# Patient Record
Sex: Male | Born: 1960 | Race: White | Hispanic: No | Marital: Married | Smoking: Never smoker
Health system: Southern US, Community
[De-identification: ages and names within clinical notes are randomized; demographics above are authoritative.]

## PROBLEM LIST (undated history)

## (undated) DIAGNOSIS — D649 Anemia, unspecified: Secondary | ICD-10-CM

## (undated) DIAGNOSIS — Z789 Other specified health status: Secondary | ICD-10-CM

## (undated) HISTORY — DX: Anemia, unspecified: D64.9

## (undated) HISTORY — PX: CATARACT EXTRACTION: SUR2

## (undated) HISTORY — PX: CHOLECYSTECTOMY: SHX55

## (undated) HISTORY — PX: APPENDECTOMY: SHX54

## (undated) HISTORY — PX: UPPER GASTROINTESTINAL ENDOSCOPY: SHX188

## (undated) HISTORY — PX: VASECTOMY: SHX75

## (undated) HISTORY — PX: COLONOSCOPY: SHX174

---
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
## 1997-12-11 ENCOUNTER — Emergency Department (HOSPITAL_COMMUNITY): Admission: EM | Admit: 1997-12-11 | Discharge: 1997-12-11 | Payer: Self-pay | Admitting: Emergency Medicine

## 1997-12-18 ENCOUNTER — Emergency Department (HOSPITAL_COMMUNITY): Admission: EM | Admit: 1997-12-18 | Discharge: 1997-12-18 | Payer: Self-pay | Admitting: Emergency Medicine

## 2000-11-22 ENCOUNTER — Other Ambulatory Visit: Admission: RE | Admit: 2000-11-22 | Discharge: 2000-11-22 | Payer: Self-pay | Admitting: Urology

## 2012-08-20 ENCOUNTER — Encounter (HOSPITAL_COMMUNITY): Payer: Self-pay | Admitting: Emergency Medicine

## 2012-08-20 ENCOUNTER — Emergency Department (HOSPITAL_COMMUNITY)
Admission: EM | Admit: 2012-08-20 | Discharge: 2012-08-20 | Disposition: A | Discharge status: Home / Self Care | Payer: Managed Care, Other (non HMO) | Attending: Emergency Medicine | Admitting: Emergency Medicine

## 2012-08-20 ENCOUNTER — Emergency Department (HOSPITAL_COMMUNITY): Payer: Managed Care, Other (non HMO)

## 2012-08-20 DIAGNOSIS — N200 Calculus of kidney: Secondary | ICD-10-CM | POA: Insufficient documentation

## 2012-08-20 DIAGNOSIS — N23 Unspecified renal colic: Secondary | ICD-10-CM | POA: Insufficient documentation

## 2012-08-20 DIAGNOSIS — Z9089 Acquired absence of other organs: Secondary | ICD-10-CM | POA: Insufficient documentation

## 2012-08-20 DIAGNOSIS — R112 Nausea with vomiting, unspecified: Secondary | ICD-10-CM | POA: Insufficient documentation

## 2012-08-20 DIAGNOSIS — R319 Hematuria, unspecified: Secondary | ICD-10-CM | POA: Insufficient documentation

## 2012-08-20 LAB — URINALYSIS, ROUTINE W REFLEX MICROSCOPIC
Bilirubin Urine: NEGATIVE
Glucose, UA: NEGATIVE mg/dL
Specific Gravity, Urine: >1.03 — ABNORMAL HIGH (ref 1.005–1.030)
pH: 6 (ref 5.0–8.0)

## 2012-08-20 LAB — URINE MICROSCOPIC-ADD ON

## 2012-08-20 MED ORDER — KETOROLAC TROMETHAMINE 30 MG/ML IJ SOLN
30.0000 mg | Freq: Once | INTRAMUSCULAR | Status: AC
  Administered 2012-08-20: 30 mg via INTRAVENOUS
  Filled 2012-08-20: qty 1

## 2012-08-20 MED ORDER — SODIUM CHLORIDE 0.9 % IV SOLN
Freq: Once | INTRAVENOUS | Status: AC
  Administered 2012-08-20: 09:00:00 via INTRAVENOUS

## 2012-08-20 MED ORDER — OXYCODONE-ACETAMINOPHEN 5-325 MG PO TABS: ORAL_TABLET | ORAL | Status: DC

## 2012-08-20 MED ORDER — ONDANSETRON HCL 4 MG/2ML IJ SOLN
4.0000 mg | Freq: Once | INTRAMUSCULAR | Status: AC
  Administered 2012-08-20: 4 mg via INTRAVENOUS
  Filled 2012-08-20: qty 2

## 2012-08-20 MED ORDER — MELOXICAM 15 MG PO TABS: 15.0000 mg | ORAL_TABLET | Freq: Every day | ORAL | Status: DC

## 2012-08-20 MED ORDER — PROCHLORPERAZINE EDISYLATE 5 MG/ML IJ SOLN: 10.0000 mg | Freq: Once | INTRAMUSCULAR | Status: DC

## 2012-08-20 MED ORDER — HYDROMORPHONE HCL PF 1 MG/ML IJ SOLN
1.0000 mg | Freq: Once | INTRAMUSCULAR | Status: AC
  Administered 2012-08-20: 1 mg via INTRAVENOUS
  Filled 2012-08-20: qty 1

## 2012-08-20 NOTE — ED Notes (Signed)
States he started having right sided flank pain with hematuria yesterday, now having episodes of nausea and vomiting.

## 2012-08-20 NOTE — ED Provider Notes (Signed)
History     CSN: [id number]  Arrival date & time 08/20/12  9735   First MD Initiated Contact with Patient 08/20/12 [phone]      Chief Complaint  Patient presents with  . Hematuria  . Flank Pain    (Consider location/radiation/quality/duration/timing/severity/associated sxs/prior treatment) Patient is a 52 y.o. male presenting with flank pain. The history is provided by the patient.  Flank Pain This is a new problem. The current episode started yesterday. The problem occurs intermittently. The problem has been gradually worsening. Associated symptoms include abdominal pain, nausea and vomiting. Pertinent negatives include no arthralgias, chest pain, coughing, fever or neck pain. Nothing aggravates the symptoms. He has tried nothing for the symptoms. The treatment provided no relief.    History reviewed. No pertinent past medical history.  Past Surgical History  Procedure Laterality Date  . Appendectomy      No family history on file.  History  Substance Use Topics  . Smoking status: Not on file  . Smokeless tobacco: Not on file  . Alcohol Use: Not on file      Review of Systems  Constitutional: Negative for fever and activity change.       All ROS Neg except as noted in HPI  HENT: Negative for nosebleeds and neck pain.   Eyes: Negative for photophobia and discharge.  Respiratory: Negative for cough, shortness of breath and wheezing.   Cardiovascular: Negative for chest pain and palpitations.  Gastrointestinal: Positive for nausea, vomiting and abdominal pain. Negative for blood in stool.  Genitourinary: Positive for hematuria and flank pain. Negative for dysuria and frequency.  Musculoskeletal: Negative for back pain and arthralgias.  Skin: Negative.   Neurological: Negative for dizziness, seizures and speech difficulty.  Psychiatric/Behavioral: Negative for hallucinations and confusion.    Allergies  Review of patient's allergies indicates no known  allergies.  Home Medications  No current outpatient prescriptions on file.  BP 117/77  Pulse 76  Temp(Src) 97 F (36.1 C) (Oral)  Resp 20  Ht 5' 11"  (1.803 m)  Wt 230 lb (104.327 kg)  BMI 32.09 kg/m2  SpO2 100%  Physical Exam  Nursing note and vitals reviewed. Constitutional: He is oriented to person, place, and time. He appears well-developed and well-nourished.  Non-toxic appearance.  HENT:  Head: Normocephalic.  Right Ear: Tympanic membrane and external ear normal.  Left Ear: Tympanic membrane and external ear normal.  Eyes: EOM and lids are normal. Pupils are equal, round, and reactive to light.  Neck: Normal range of motion. Neck supple. Carotid bruit is not present.  Cardiovascular: Normal rate, regular rhythm, normal heart sounds, intact distal pulses and normal pulses.   Pulmonary/Chest: Breath sounds normal. No respiratory distress.  Abdominal: Soft. Bowel sounds are normal. There is tenderness. There is guarding.  Right flank pain. Right CVAT, Right lower quad pain to palpation. Neg psoas.   Musculoskeletal: Normal range of motion.  Lymphadenopathy:       Head (right side): No submandibular adenopathy present.       Head (left side): No submandibular adenopathy present.    He has no cervical adenopathy.  Neurological: He is alert and oriented to person, place, and time. He has normal strength. No cranial nerve deficit or sensory deficit.  Skin: Skin is warm and dry.  Psychiatric: He has a normal mood and affect. His speech is normal.    ED Course  Procedures (including critical care time)  Labs Reviewed  URINALYSIS, ROUTINE W REFLEX MICROSCOPIC   No  results found.   No diagnosis found.    MDM  I have reviewed nursing notes, vital signs, and all appropriate lab and imaging results for this patient.  Pt present to ED with right CVAT, hematuria and generally not feeling well. Has had appendectomy in the past. UA reveals large Hgb and 21-50 RBC's. CT abd  reveals 70m UVJ calculus on the right with mild hydronephrosis. There is also noted renal cyst and diverticulosis on the CT scan. Pt will strain all urine and see Urology for evaluation. Rx for Mobic and Roxicet given to the patient.      HLenox Ahr PA-C 08/24/12 1045

## 2012-08-24 NOTE — ED Provider Notes (Signed)
Medical screening examination/treatment/procedure(s) were performed by non-physician practitioner and as supervising physician I was immediately available for consultation/collaboration.   Ezequiel Essex, MD 08/24/12 1113

## 2016-03-22 ENCOUNTER — Emergency Department (HOSPITAL_COMMUNITY): Payer: Managed Care, Other (non HMO)

## 2016-03-22 ENCOUNTER — Inpatient Hospital Stay (HOSPITAL_COMMUNITY)
Admission: EM | Admit: 2016-03-22 | Discharge: 2016-03-24 | DRG: 871 | Disposition: A | Discharge status: Other Acute Inpatient Hospital | Payer: Managed Care, Other (non HMO) | Attending: Internal Medicine | Admitting: Internal Medicine

## 2016-03-22 ENCOUNTER — Encounter (HOSPITAL_COMMUNITY): Payer: Self-pay | Admitting: Emergency Medicine

## 2016-03-22 DIAGNOSIS — K2970 Gastritis, unspecified, without bleeding: Secondary | ICD-10-CM | POA: Diagnosis not present

## 2016-03-22 DIAGNOSIS — R7989 Other specified abnormal findings of blood chemistry: Secondary | ICD-10-CM | POA: Diagnosis present

## 2016-03-22 DIAGNOSIS — K2980 Duodenitis without bleeding: Secondary | ICD-10-CM | POA: Diagnosis not present

## 2016-03-22 DIAGNOSIS — D374 Neoplasm of uncertain behavior of colon: Secondary | ICD-10-CM | POA: Diagnosis present

## 2016-03-22 DIAGNOSIS — E871 Hypo-osmolality and hyponatremia: Secondary | ICD-10-CM | POA: Diagnosis not present

## 2016-03-22 DIAGNOSIS — I81 Portal vein thrombosis: Secondary | ICD-10-CM | POA: Diagnosis present

## 2016-03-22 DIAGNOSIS — K529 Noninfective gastroenteritis and colitis, unspecified: Secondary | ICD-10-CM | POA: Diagnosis present

## 2016-03-22 DIAGNOSIS — R634 Abnormal weight loss: Secondary | ICD-10-CM | POA: Diagnosis not present

## 2016-03-22 DIAGNOSIS — R748 Abnormal levels of other serum enzymes: Secondary | ICD-10-CM | POA: Diagnosis present

## 2016-03-22 DIAGNOSIS — Z6831 Body mass index (BMI) 31.0-31.9, adult: Secondary | ICD-10-CM | POA: Diagnosis not present

## 2016-03-22 DIAGNOSIS — R197 Diarrhea, unspecified: Secondary | ICD-10-CM | POA: Diagnosis not present

## 2016-03-22 DIAGNOSIS — K5190 Ulcerative colitis, unspecified, without complications: Secondary | ICD-10-CM

## 2016-03-22 DIAGNOSIS — K317 Polyp of stomach and duodenum: Secondary | ICD-10-CM | POA: Diagnosis present

## 2016-03-22 DIAGNOSIS — E876 Hypokalemia: Secondary | ICD-10-CM | POA: Diagnosis present

## 2016-03-22 DIAGNOSIS — A419 Sepsis, unspecified organism: Secondary | ICD-10-CM | POA: Diagnosis present

## 2016-03-22 DIAGNOSIS — K5730 Diverticulosis of large intestine without perforation or abscess without bleeding: Secondary | ICD-10-CM | POA: Diagnosis present

## 2016-03-22 DIAGNOSIS — D62 Acute posthemorrhagic anemia: Secondary | ICD-10-CM | POA: Diagnosis not present

## 2016-03-22 DIAGNOSIS — E860 Dehydration: Secondary | ICD-10-CM | POA: Diagnosis present

## 2016-03-22 DIAGNOSIS — D638 Anemia in other chronic diseases classified elsewhere: Secondary | ICD-10-CM | POA: Diagnosis not present

## 2016-03-22 DIAGNOSIS — R945 Abnormal results of liver function studies: Secondary | ICD-10-CM

## 2016-03-22 DIAGNOSIS — Z809 Family history of malignant neoplasm, unspecified: Secondary | ICD-10-CM

## 2016-03-22 DIAGNOSIS — D689 Coagulation defect, unspecified: Secondary | ICD-10-CM

## 2016-03-22 DIAGNOSIS — K645 Perianal venous thrombosis: Secondary | ICD-10-CM | POA: Diagnosis not present

## 2016-03-22 DIAGNOSIS — K8000 Calculus of gallbladder with acute cholecystitis without obstruction: Secondary | ICD-10-CM

## 2016-03-22 DIAGNOSIS — R112 Nausea with vomiting, unspecified: Secondary | ICD-10-CM

## 2016-03-22 DIAGNOSIS — D376 Neoplasm of uncertain behavior of liver, gallbladder and bile ducts: Secondary | ICD-10-CM | POA: Diagnosis present

## 2016-03-22 HISTORY — DX: Coagulation defect, unspecified: D68.9

## 2016-03-22 HISTORY — DX: Other specified health status: Z78.9

## 2016-03-22 LAB — INFLUENZA PANEL BY PCR (TYPE A & B)
Influenza A By PCR: NEGATIVE
Influenza B By PCR: NEGATIVE

## 2016-03-22 LAB — COMPREHENSIVE METABOLIC PANEL
ALK PHOS: 104 U/L (ref 38–126)
ALT: 127 U/L — ABNORMAL HIGH (ref 17–63)
ANION GAP: 11 (ref 5–15)
AST: 164 U/L — ABNORMAL HIGH (ref 15–41)
Albumin: 2.4 g/dL — ABNORMAL LOW (ref 3.5–5.0)
BUN: 11 mg/dL (ref 6–20)
CALCIUM: 7.9 mg/dL — AB (ref 8.9–10.3)
CO2: 24 mmol/L (ref 22–32)
Chloride: 96 mmol/L — ABNORMAL LOW (ref 101–111)
Creatinine, Ser: 0.88 mg/dL (ref 0.61–1.24)
GFR calc non Af Amer: >60 mL/min (ref >60)
Glucose, Bld: 105 mg/dL — ABNORMAL HIGH (ref 65–99)
Potassium: 3.4 mmol/L — ABNORMAL LOW (ref 3.5–5.1)
SODIUM: 131 mmol/L — AB (ref 135–145)
TOTAL PROTEIN: 7.9 g/dL (ref 6.5–8.1)
Total Bilirubin: 0.8 mg/dL (ref 0.3–1.2)

## 2016-03-22 LAB — TYPE AND SCREEN
ABO/RH(D): B POS
ANTIBODY SCREEN: NEGATIVE

## 2016-03-22 LAB — MAGNESIUM: MAGNESIUM: 1.5 mg/dL — AB (ref 1.7–2.4)

## 2016-03-22 LAB — LIPASE, BLOOD: Lipase: 60 U/L — ABNORMAL HIGH (ref 11–51)

## 2016-03-22 LAB — URINALYSIS, ROUTINE W REFLEX MICROSCOPIC
BACTERIA UA: NONE SEEN
Bilirubin Urine: NEGATIVE
GLUCOSE, UA: NEGATIVE mg/dL
Ketones, ur: NEGATIVE mg/dL
LEUKOCYTES UA: NEGATIVE
Nitrite: NEGATIVE
PROTEIN: NEGATIVE mg/dL
Specific Gravity, Urine: 1.026 (ref 1.005–1.030)
pH: 7 (ref 5.0–8.0)

## 2016-03-22 LAB — CBC WITH DIFFERENTIAL/PLATELET
BASOS PCT: 0 %
Basophils Absolute: 0 10*3/uL (ref 0.0–0.1)
EOS ABS: 0 10*3/uL (ref 0.0–0.7)
EOS PCT: 0 %
HCT: 33.3 % — ABNORMAL LOW (ref 39.0–52.0)
HEMOGLOBIN: 11 g/dL — AB (ref 13.0–17.0)
Lymphocytes Relative: 5 %
Lymphs Abs: 0.9 10*3/uL (ref 0.7–4.0)
MCH: 28 pg (ref 26.0–34.0)
MCHC: 33 g/dL (ref 30.0–36.0)
MCV: 84.7 fL (ref 78.0–100.0)
MONO ABS: 0.8 10*3/uL (ref 0.1–1.0)
MONOS PCT: 5 %
NEUTROS PCT: 90 %
Neutro Abs: 15.1 10*3/uL — ABNORMAL HIGH (ref 1.7–7.7)
Platelets: 324 10*3/uL (ref 150–400)
RBC: 3.93 MIL/uL — ABNORMAL LOW (ref 4.22–5.81)
RDW: 13 % (ref 11.5–15.5)
WBC: 16.8 10*3/uL — ABNORMAL HIGH (ref 4.0–10.5)

## 2016-03-22 LAB — RETICULOCYTES
RBC.: 3.94 MIL/uL — ABNORMAL LOW (ref 4.22–5.81)
Retic Count, Absolute: 35.5 10*3/uL (ref 19.0–186.0)
Retic Ct Pct: 0.9 % (ref 0.4–3.1)

## 2016-03-22 LAB — LACTIC ACID, PLASMA
LACTIC ACID, VENOUS: 0.9 mmol/L (ref 0.5–1.9)
LACTIC ACID, VENOUS: 1.2 mmol/L (ref 0.5–1.9)

## 2016-03-22 LAB — PROCALCITONIN: PROCALCITONIN: 4.63 ng/mL

## 2016-03-22 LAB — PROTIME-INR
INR: 1.41
Prothrombin Time: 17.4 seconds — ABNORMAL HIGH (ref 11.4–15.2)

## 2016-03-22 LAB — I-STAT CG4 LACTIC ACID, ED: LACTIC ACID, VENOUS: 0.9 mmol/L (ref 0.5–1.9)

## 2016-03-22 LAB — APTT: APTT: 37 s — AB (ref 24–36)

## 2016-03-22 MED ORDER — SODIUM CHLORIDE 0.9 % IV BOLUS (SEPSIS)
1000.0000 mL | Freq: Once | INTRAVENOUS | Status: AC
  Administered 2016-03-22: 1000 mL via INTRAVENOUS

## 2016-03-22 MED ORDER — ONDANSETRON HCL 4 MG/2ML IJ SOLN
4.0000 mg | Freq: Once | INTRAMUSCULAR | Status: AC
  Administered 2016-03-22: 4 mg via INTRAVENOUS
  Filled 2016-03-22: qty 2

## 2016-03-22 MED ORDER — MORPHINE SULFATE (PF) 2 MG/ML IV SOLN: 2.0000 mg | INTRAVENOUS | Status: DC | PRN

## 2016-03-22 MED ORDER — ACETAMINOPHEN 650 MG RE SUPP: 650.0000 mg | Freq: Four times a day (QID) | RECTAL | Status: DC | PRN

## 2016-03-22 MED ORDER — OXYCODONE HCL 5 MG PO TABS: 5.0000 mg | ORAL_TABLET | ORAL | Status: DC | PRN

## 2016-03-22 MED ORDER — PIPERACILLIN-TAZOBACTAM 3.375 G IVPB 30 MIN
3.3750 g | Freq: Once | INTRAVENOUS | Status: AC
  Administered 2016-03-22: 3.375 g via INTRAVENOUS
  Filled 2016-03-22: qty 50

## 2016-03-22 MED ORDER — SODIUM CHLORIDE 0.9 % IV BOLUS (SEPSIS)
2000.0000 mL | Freq: Once | INTRAVENOUS | Status: AC
  Administered 2016-03-22: 2000 mL via INTRAVENOUS

## 2016-03-22 MED ORDER — MAGNESIUM SULFATE 2 GM/50ML IV SOLN
2.0000 g | Freq: Once | INTRAVENOUS | Status: AC
  Administered 2016-03-22: 2 g via INTRAVENOUS
  Filled 2016-03-22: qty 50

## 2016-03-22 MED ORDER — POTASSIUM CHLORIDE 20 MEQ PO PACK
40.0000 meq | PACK | Freq: Once | ORAL | Status: AC
  Administered 2016-03-22: 40 meq via ORAL
  Filled 2016-03-22: qty 2

## 2016-03-22 MED ORDER — PIPERACILLIN-TAZOBACTAM 3.375 G IVPB
3.3750 g | Freq: Three times a day (TID) | INTRAVENOUS | Status: DC
  Administered 2016-03-23 – 2016-03-24 (×5): 3.375 g via INTRAVENOUS
  Filled 2016-03-22 (×5): qty 50

## 2016-03-22 MED ORDER — ACETAMINOPHEN 325 MG PO TABS
650.0000 mg | ORAL_TABLET | Freq: Four times a day (QID) | ORAL | Status: DC | PRN
  Administered 2016-03-22 – 2016-03-23 (×2): 650 mg via ORAL
  Filled 2016-03-22 (×2): qty 2

## 2016-03-22 MED ORDER — ENOXAPARIN SODIUM 40 MG/0.4ML ~~LOC~~ SOLN
40.0000 mg | SUBCUTANEOUS | Status: DC
  Administered 2016-03-23: 40 mg via SUBCUTANEOUS
  Filled 2016-03-22: qty 0.4

## 2016-03-22 MED ORDER — SODIUM CHLORIDE 0.9 % IV BOLUS (SEPSIS)
1000.0000 mL | Freq: Once | INTRAVENOUS | Status: AC
  Administered 2016-03-23: 1000 mL via INTRAVENOUS

## 2016-03-22 MED ORDER — SODIUM CHLORIDE 0.9 % IV BOLUS (SEPSIS)
1000.0000 mL | Freq: Once | INTRAVENOUS | Status: AC
Start: 2016-03-22 — End: 2016-03-22
  Administered 2016-03-22: 1000 mL via INTRAVENOUS

## 2016-03-22 MED ORDER — IOPAMIDOL (ISOVUE-300) INJECTION 61%
100.0000 mL | Freq: Once | INTRAVENOUS | Status: AC | PRN
  Administered 2016-03-22: 100 mL via INTRAVENOUS

## 2016-03-22 MED ORDER — ACETAMINOPHEN 325 MG PO TABS
650.0000 mg | ORAL_TABLET | Freq: Once | ORAL | Status: AC
  Administered 2016-03-22: 650 mg via ORAL
  Filled 2016-03-22: qty 2

## 2016-03-22 NOTE — Assessment & Plan Note (Signed)
Mildly elevated on admission at 60. Plan: No evidence of pancreatitis. No intervention.

## 2016-03-22 NOTE — Assessment & Plan Note (Addendum)
Present on admission. Source: likely GI source, either GB or colitis. Criteria: Fever 103.2, tachycardia, and elevated WBCs 16.8. Plan:  Sepsis order set. Cultures ordered, including blood, sputum, urine. Flu swab ordered. IV antibiotics: IV Zosyn. IV fluids to provide volume.  Monitor for signs of volume depletion; monitor blood pressure carefully.  Close monitoring.  If patient has hypotension, give IVF: initial IVF 30 mL/kg x 1, then then maintenance IVF.

## 2016-03-22 NOTE — Progress Notes (Signed)
ANTIBIOTIC CONSULT NOTE-Preliminary  Pharmacy Consult for zosyn Indication: intra-abdominal infection  No Known Allergies  Patient Measurements: Height: 5' 9"  (175.3 cm) Weight: 203 lb (92.1 kg) IBW/kg (Calculated) : 70.7 Adjusted Body Weight:   Vital Signs: Temp: 103.2 F (39.6 C) (01/01 2151) Temp Source: Oral (01/01 2151) BP: 113/55 (01/01 2151) Pulse Rate: 129 (01/01 2151)  Labs:  Recent Labs  03/22/16 1640  WBC 16.8*  HGB 11.0*  PLT 324  CREATININE 0.Brett    Estimated Creatinine Clearance: 107.6 mL/min (by C-G formula based on SCr of 0.Brett mg/dL).  No results for input(s): VANCOTROUGH, VANCOPEAK, VANCORANDOM, GENTTROUGH, GENTPEAK, GENTRANDOM, TOBRATROUGH, TOBRAPEAK, TOBRARND, AMIKACINPEAK, AMIKACINTROU, AMIKACIN in the last 72 hours.   Microbiology: Recent Results (from the past 720 hour(s))  Blood Culture (routine x 2)     Status: None (Preliminary result)   Collection Time: 03/22/16  5:46 PM  Result Value Ref Range Status   Specimen Description BLOOD RIGHT ARM  Final   Special Requests BOTTLES DRAWN AEROBIC AND ANAEROBIC [facility] EACH  Final   Culture PENDING  Incomplete   Report Status PENDING  Incomplete  Blood Culture (routine x 2)     Status: None (Preliminary result)   Collection Time: 03/22/16  5:49 PM  Result Value Ref Range Status   Specimen Description BLOOD LEFT ARM  Final   Special Requests BOTTLES DRAWN AEROBIC AND ANAEROBIC [place]  Final   Culture PENDING  Incomplete   Report Status PENDING  Incomplete    Medical History: History reviewed. No pertinent past medical history.  Medications:  No prescriptions prior to admission.   Scheduled:  . enoxaparin (LOVENOX) injection  40 mg Subcutaneous Q24H  . [START ON 03/23/2016] piperacillin-tazobactam (ZOSYN)  IV  3.375 g Intravenous Q8H  . sodium chloride  1,000 mL Intravenous Once   And  . sodium chloride  1,000 mL Intravenous Once   And  . sodium chloride  1,000 mL Intravenous Once    Infusions:   PRN: acetaminophen **OR** acetaminophen, oxyCODONE Anti-infectives    Start     Dose/Rate Route Frequency Ordered Stop   03/23/16 0200  piperacillin-tazobactam (ZOSYN) IVPB 3.375 g     3.375 g 12.5 mL/hr over 240 Minutes Intravenous Every 8 hours 03/22/16 2220     03/22/16 1745  piperacillin-tazobactam (ZOSYN) IVPB 3.375 g     3.375 g 100 mL/hr over 30 Minutes Intravenous  Once 03/22/16 1736 03/22/16 1835      Assessment: 56 yo Bright with vomiting, fever. 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 Starting zosyn.   Plan:  Preliminary review of pertinent patient information completed.  Protocol will be initiated with a one-time dose(s) of zosyn 3.375 grams IV Q8 hours.  Forestine Na clinical pharmacist will complete review during morning rounds to assess patient and finalize treatment regimen.  Nyra Capes, RPH 03/22/2016,10:21 PM

## 2016-03-22 NOTE — H&P (Signed)
History and Physical    Patient:  Brett Bright  MRN: [id number]  DOB:  [birth year]   Date of Admission:  03/22/2016  PRIMARY CARE PROVIDER (PCP): [facility] in [place], [place] (does not have a specific doctor there, per patient)   Outpatient Specialists: None Patient coming from: Home   Chief Complaint: Nausea, can't eat, not feeling well  HPI:  The patient is a 56 yo man with no medical problems who presents with nausea, chills, and inability to eat.  He states his symptoms started just over 2 weeks ago, when he became sick on 03/06/16 with nausea, vomiting. He started to feel a bit better then on 03/14/16 has severe vomiting; he has not had any further vomiting since that day but has had severe nausea and poor appetite. He has not been able to eat anything in the last week because nothing tastes good and he feels full all the time; he has been able to drink liquids. He reports when he does take PO he gets "acid" in his mouth, and has been having significant acid reflux.   He is normally active but he has slept most of the last week. He has felt fatigued with no energy. Despite feeling terrible, he proudly states he did make it to work every day except one. He reports he does not have abdominal pain. This evening he started having diarrhea that is black and watery. He had noticed a small amount of blood in stool last week but he thought it was due to a new hemorrhoid that he developed. Today he has had fever and severe shaking chills.   Regarding his symptoms:  Onset: just over 2 weeks ago on 03/06/16. Duration: intermittent. Timing: Frequent. Worse on 03/14/16 and within the last week. Severity: Severe at times. Context: Has been in his usual state of health. No recent changes to diet or activity. Character/Quality: Nausea is hard to describe; a sick feeling, with frequent acid reflux taste in mouth. Alleviated by: Nothing. Exacerbated by: Eating. Associated Symptoms:  GI: Vomiting was severe but none since 03/14/16. Diarrhea started this evening and is black. Denies abdominal pain. Had small amount of blood in stool this week associated with new hemorrhoid. Severe decrease in appetite and no PO intake other than liquids this week. GEN: Fever and shaking chills today. Twelve pound weight loss over last 2 weeks. Increased fatigue and somnolence. Other: Has not noticed any change in his health in the last year. Also has not noticed any change in his bowel habits before this week; no change in the caliber of his stools. Treatments: none at home.  The patient has never had a colonoscopy. He does not have any medical problems; no asthma, no heart disease. He does not smoke or use alcohol.  Family history recorded below, with no known colon or gall bladder cancer, but a MGF and maternal aunts/uncles with unknown cancers.   ED Course: The patient was evaluated and had shaking chills with fever of 103.2. CT abdomen/pelvis showed abnormalities including nodular sofft tissue in the gallbladder wall with gallstones, with associated necrotic lymph node, thrombosis of right hepatic portal vein, and circumferential thickening of the sigmoid colin with adjacent abnormal enlarged lymph nodes, suspicious for colonic neoplasm. The patient was thought to have sepsis possibly with the gall bladder as source. Sepsis protocol was initiated. Patient received IVF and one dose of IV Zosyn. The ED physician contacted the general surgeon, Dr. Arnoldo Morale, who requested patient be  placed on a clear liquid diet and he would see the patient in the am; he also requested that GI be consulted. He is being admitted for further management.  PMH: History reviewed. No pertinent past medical history.  PSH: Past Surgical History:  Procedure Laterality Date  . APPENDECTOMY  1975   in [place]: No Known Allergies  MEDICATIONS: No current facility-administered medications on file prior  to encounter.    No current outpatient prescriptions on file prior to encounter.    SOCIAL HISTORY: Social History   Social History  . Marital status: Married    Spouse name: N/A  . Number of children: 3  . Years of education: N/A   Occupational History  . Parts Clerk     At [employer]   Social History Main Topics  . Smoking status: Never Smoker  . Smokeless tobacco: Never Used  . Alcohol use No  . Drug use: No  . Sexual activity: Not on file   Other Topics Concern  . 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   He is married and has 3 children, who are ages 1, 63, and 87 as of 2017.   The 57 and 13 year old children are still living at home, but the 55 yo is married and lives elsewhere.   The patient lives in [place], [place].      His primary care is [facility] in [place], [place].    Family History  Problem Relation Age of Onset  . Stroke Mother   . Cancer Maternal Grandfather     Unknown type of cancer  . Heart attack Paternal Grandmother   . Cancer Maternal Aunt     Possibly lung cancer  . Cancer Paternal Uncle     Unknown type of cancer  . Cancer Paternal Uncle     Unknown type of cancer   Other Family History: Father: died of old age at 50 yo. Paternal GF: died of old age in his 18s. Maternal GM: died of old age at 55 yo.   Review of Systems:   ROS: GENERAL: Fever, severe shaking chills, and fatigue/malaise. Increased sleeping. Decreased appetite and decreased PO intake. Weight loss of 12 lbs over last 2 weeks; no significant weight change previously. HEENT: No nasal discharge or bleeding. No throat pain or swelling. No eye pain or eye redness.  RESPIRATORY: No cough, wheezing, or shortness of breath.  CARDIOVASCULAR: No chest pain or palpitations.  GI: per HPI. NEUROLOGICAL: No headache or focal weakness.  INTEGUMENT: no rashes, itching, or new lesions.  LYMPHATIC  SYSTEM: no lymph node swelling or pain.  MUSCULOSKELETAL: no joint pain or joint swelling.  GENITOURINARY: No dysuria or hematuria.  ENDOCRINE: No polyuria or polydipsia.  HEME: No chronic anemia, bleeding (except small amount per HPI), or easy bruising.   Physical Exam:  Vitals:   03/22/16 1902 03/22/16 1930 03/22/16 2030 03/22/16 2058  BP: 97/60 100/58 103/65   Pulse: 107 101 106   Resp:  (!) 29    Temp:    98.5 F (36.9 C)  TempSrc:    Oral  SpO2: 95% 94% 97%   Weight:      Height:       Blood pressure (!) 113/55, pulse (!) 129, temperature (!) 103.2 F (39.6 C), temperature source Oral, resp. rate (!) 29, height 5' 9" (1.753 m), weight 92.1  kg (203 lb), SpO2 93 %.   GENERAL: Ill-appearing, well nourished, well-developed, in acute distress with uncontrollable rigors.  HEENT: Normocephalic, atraumatic; pupils equal and round. Nares patent, without discharge or bleeding. No oropharyngeal lesions or erythema. Mucous membranes are dry.  NECK: is supple, no masses, trachea midline.  RESPIRATORY: Clear to auscultation bilaterally. Chest wall movements are symmetric. No use of accessory muscles to breathe.  No wheezing, rales, rhonchi. CARDIOVASCULAR: Normal S1, S2. No rubs, or gallops. PMI non-displaced. Carotids: no carotid bruits.  Mild tachycardia. DP pulses diminished at 1+ bilaterally. Capillary refill less than 3 seconds.  GI: soft, non-distended, normal active bowel sounds. No hepatosplenomegaly. Mild tenderness in right upper quadrant. INTEGUMENT: Clean, dry, and intact. No rashes. Warm to touch.  MUSCULOSKELETAL: Moving all extremities. No cyanosis. No clubbing. Edema: none bilaterally.  Coarse shaking chills involving entire body. NEUROLOGICAL: Cranial nerves 2-12 grossly intact. Reflexes: 2+ bilaterally. Babinski: toes downgoing bilaterally.  Motor 5/5 throughout. Sensory grossly intact to light touch. Intact rapid alternating movements bilaterally. No pronator drift.    PSYCHIATRIC: Fully oriented. Normal and appropriate affect.  LYMPHATIC: No cervical lymphadenopathy. No supraclavicular lymphadenopathy.   Labs on Admission: I have personally reviewed following labs and imaging studies.  Results for orders placed or performed during the hospital encounter of 03/22/16 (from the past 24 hour(s))  CBC with Differential/Platelet   Collection Time: 03/22/16  4:40 PM  Result Value Ref Range   WBC 16.8 (H) 4.0 - 10.5 K/uL   RBC 3.93 (L) 4.22 - 5.81 MIL/uL   Hemoglobin 11.0 (L) 13.0 - 17.0 g/dL   HCT 33.3 (L) 39.0 - 52.0 %   MCV 84.7 78.0 - 100.0 fL   MCH 28.0 26.0 - 34.0 pg   MCHC 33.0 30.0 - 36.0 g/dL   RDW 13.0 11.5 - 15.5 %   Platelets 324 150 - 400 K/uL   Neutrophils Relative % 90 %   Neutro Abs 15.1 (H) 1.7 - 7.7 K/uL   Lymphocytes Relative 5 %   Lymphs Abs 0.9 0.7 - 4.0 K/uL   Monocytes Relative 5 %   Monocytes Absolute 0.8 0.1 - 1.0 K/uL   Eosinophils Relative 0 %   Eosinophils Absolute 0.0 0.0 - 0.7 K/uL   Basophils Relative 0 %   Basophils Absolute 0.0 0.0 - 0.1 K/uL  Comprehensive metabolic panel   Collection Time: 03/22/16  4:40 PM  Result Value Ref Range   Sodium 131 (L) 135 - 145 mmol/L   Potassium 3.4 (L) 3.5 - 5.1 mmol/L   Chloride 96 (L) 101 - 111 mmol/L   CO2 24 22 - 32 mmol/L   Glucose, Bld 105 (H) 65 - 99 mg/dL   BUN 11 6 - 20 mg/dL   Creatinine, Ser 0.88 0.61 - 1.24 mg/dL   Calcium 7.9 (L) 8.9 - 10.3 mg/dL   Total Protein 7.9 6.5 - 8.1 g/dL   Albumin 2.4 (L) 3.5 - 5.0 g/dL   AST 164 (H) 15 - 41 U/L   ALT 127 (H) 17 - 63 U/L   Alkaline Phosphatase 104 38 - 126 U/L   Total Bilirubin 0.8 0.3 - 1.2 mg/dL   GFR calc non Af Amer >60 >60 mL/min   GFR calc Af Amer >60 >60 mL/min   Anion gap 11 5 - 15  Lipase, blood   Collection Time: 03/22/16  4:40 PM  Result Value Ref Range   Lipase 60 (H) 11 - 51 U/L  Blood Culture (routine x 2)  Collection Time: 03/22/16  5:46 PM  Result Value Ref Range   Specimen Description BLOOD  RIGHT ARM    Special Requests BOTTLES DRAWN AEROBIC AND ANAEROBIC 6CC EACH    Culture PENDING    Report Status PENDING   Blood Culture (routine x 2)   Collection Time: 03/22/16  5:49 PM  Result Value Ref Range   Specimen Description BLOOD LEFT ARM    Special Requests BOTTLES DRAWN AEROBIC AND ANAEROBIC 6CC EACH    Culture PENDING    Report Status PENDING   I-Stat CG4 Lactic Acid, ED  (not at  [facility])   Collection Time: 03/22/16  6:04 PM  Result Value Ref Range   Lactic Acid, Venous 0.90 0.5 - 1.9 mmol/L  Urinalysis, Routine w reflex microscopic   Collection Time: 03/22/16  7:59 PM  Result Value Ref Range   Color, Urine YELLOW YELLOW   APPearance CLEAR CLEAR   Specific Gravity, Urine 1.026 1.005 - 1.030   pH 7.0 5.0 - 8.0   Glucose, UA NEGATIVE NEGATIVE mg/dL   Hgb urine dipstick SMALL (A) NEGATIVE   Bilirubin Urine NEGATIVE NEGATIVE   Ketones, ur NEGATIVE NEGATIVE mg/dL   Protein, ur NEGATIVE NEGATIVE mg/dL   Nitrite NEGATIVE NEGATIVE   Leukocytes, UA NEGATIVE NEGATIVE   RBC / HPF 0-5 0 - 5 RBC/hpf   WBC, UA 0-5 0 - 5 WBC/hpf   Bacteria, UA NONE SEEN NONE SEEN        Radiological Exams on Admission:  CT ABDOMEN AND PELVIS WITH CONTRAST  COMPARISON:  August 20, 2012  FINDINGS: Lower chest: No acute abnormality.  Hepatobiliary: There are gallstones within the gallbladder. There are nodular soft tissue density along the gallbladder wall. 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 There is 1 cm low-density lesion in the right lobe liver. There is 2.5 cm probable necrotic lymph node in the porta hepatis.  Pancreas: Unremarkable. No pancreatic ductal dilatation or surrounding inflammatory changes.  Spleen: Normal in size without focal abnormality.  Adrenals/Urinary Tract: The adrenal glands are normal. There are simple cysts in both kidneys, largest in the right kidney measuring 9.4 x 10.4 cm unchanged. There are hyperdense lesions in the lower pole  of the right kidney unchanged compared prior exam consistent with hemorrhagic cysts. There is no hydronephrosis bilaterally. The bladder is normal.  Stomach/Bowel: There is a 7 cm segment of abnormal circumferential thickening of the sigmoid colon. There are adjacent abnormal enlarged pericolonic lymph node, the largest measures 1.3 cm. There is no small bowel obstruction. The patient status post prior appendectomy.  Vascular/Lymphatic: The aorta is normal. There are small subcentimeter lymph nodes in the retroperitoneum.  Reproductive: Prostate is unremarkable.  Other: There is small umbilical herniation of mesenteric fat.  Musculoskeletal: Degenerative joint changes of the spine are identified. There are small lucencies within the left sacrum unchanged compared to prior CT likely chronic change. No other focal discrete lytic or blastic lesion is identified.  IMPRESSION: Circumferential thickening of the sigmoid colon with adjacent abnormal enlarged lymph nodes. The findings are suspicious for colonic neoplasm with adjacent lymph node extension.  Nodular soft tissue in the gallbladder wall with gallstones. There is probable associated necrotic lymph node in the porta hepatis. 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 Findings are suspicious for neoplasm question of gallbladder origin.   X-Ray, Abdomen and Chest: IMPRESSION: Negative radiographs. Gas pattern within normal limits. No free air.   EKG: Independently reviewed.  104 bpm. Sinus  tachycardia. Probable left atrial enlargement. Borderline right axis deviation.   Assessment/Plan  Sepsis due to undetermined organism [facility]) Present on admission. Source: likely GI source, either GB or colitis. Criteria: Fever 103.2, tachycardia, and elevated WBCs 16.8. Plan:  Sepsis order set. Cultures ordered, including blood, sputum, urine. Flu swab ordered. IV antibiotics: IV Zosyn. IV fluids to provide volume.    Monitor for signs of volume depletion; monitor blood pressure carefully.  Close monitoring.  If patient has hypotension, give IVF: initial IVF 30 mL/kg x 1, then then maintenance IVF.   Acute calculous cholecystitis Suspected as source of infection. See abnormal CT results. Plan: Cultures ordered. IV Zosyn. Clear liquids for now, then NPO after midnight. ED physician contacted general surgeon, Dr. Arnoldo Morale, who will consult on patient.  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 Associated necrotic lymph node adjacent. Plan: General surgeon consulted, Dr. Arnoldo Morale. Will consult GI also. Patient informed of CT findings.  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 Circumferential thickening in sigmoid colon with enlarged surrounding lymph nodes. Never had colonoscopy. No history of decreased caliber of stools or changes in BM. Plan: Consult GI and general surgeon. Patient informed of CT findings.  Elevated LFTs may be reactive. On admission; AST 164, ALT 127. normal T bili and alk phos. Albumin low at 2.4. Plan: Repeat in AM.  Hyponatremia Mild. na 131 on admission. Likely due to dehydration from poor PO intake over last week. Plan: Trial of IVFs. Recheck in am.  Hypokalemia Plan: Replace. Also check magnesium.  Acute blood loss anemia Possible diagnosis. Patient has mild anemia with Hb 11 on admission, with no history of any medical problems. Reported black diarrhea that started on day of admission. Plan: Hemoccult stool. Anemia labs including iron panel, ferritin, retic count, B12, folate have been ordered. Recheck CBC in am.   Abnormal serum level of lipase Mildly elevated on admission at 60. Plan: No evidence of pancreatitis. No intervention.  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 Plan: Consult general surgeon  and GI. Continue workup of abnormal findings of gallbladder and sigmoid colon.      DVT prophylaxis: Lovenox.  Code Status:  Full   Family Communication: None. Disposition Plan: Home.  Consults called: General Surgery, Dr. Arnoldo Morale, by ED. Will also consult GI. Admission status: Inpatient   Patient requires admission due to risks, which include: Death, sepsis, shock, organ failure, hemorrhage, and worsening pain and suffering.   Tacey Ruiz MD [facility] Pager [phone]

## 2016-03-22 NOTE — Assessment & Plan Note (Signed)
Plan: Replace. Also check magnesium.

## 2016-03-22 NOTE — Assessment & Plan Note (Signed)
may be reactive. On admission; AST 164, ALT 127. normal T bili and alk phos. Albumin low at 2.4. Plan: Repeat in AM.

## 2016-03-22 NOTE — ED Provider Notes (Signed)
[facility] DEPT Provider Note   CSN: [id number] Arrival date & time: 03/22/16  1552     History   Chief Complaint Chief Complaint  Patient presents with  . Emesis    HPI Brett Bright is a 56 y.o. male.  Patient complains of vomiting and fever and chills   The history is provided by the patient. No language interpreter was used.  Emesis   This is a new problem. The current episode started more than 2 days ago. The problem occurs 2 to 4 times per day. The problem has not changed since onset.The emesis has an appearance of stomach contents. The maximum temperature recorded prior to his arrival was 102 to 102.9 F. The fever has been present for less than 1 day. Associated symptoms include chills and a fever. Pertinent negatives include no abdominal pain, no cough, no diarrhea and no headaches.    History reviewed. No pertinent past medical history.  There are no active problems to display for this patient.   Past Surgical History:  Procedure Laterality Date  . APPENDECTOMY         Home Medications    Prior to Admission medications   Not on File    Family History History reviewed. No pertinent family history.  Social History Social History  Substance Use Topics  . Smoking status: Never Smoker  . Smokeless tobacco: Not on file  . Alcohol use No     Allergies   Patient has no known allergies.   Review of Systems Review of Systems  Constitutional: Positive for chills and fever. Negative for appetite change and fatigue.  HENT: Negative for congestion, ear discharge and sinus pressure.   Eyes: Negative for discharge.  Respiratory: Negative for cough.   Cardiovascular: Negative for chest pain.  Gastrointestinal: Positive for vomiting. Negative for abdominal pain and diarrhea.  Genitourinary: Negative for frequency and hematuria.  Musculoskeletal: Negative for back pain.  Skin: Negative for rash.  Neurological: Negative for seizures and headaches.    Psychiatric/Behavioral: Negative for hallucinations.     Physical Exam Updated Vital Signs BP 100/58   Pulse 101   Temp 100.2 F (37.9 C)   Resp (!) 29   Ht 5' 9"  (1.753 m)   Wt 203 lb (92.1 kg)   SpO2 94%   BMI 29.98 kg/m   Physical Exam  Constitutional: He is oriented to person, place, and time. He appears well-developed.  HENT:  Head: Normocephalic.  Eyes: Conjunctivae and EOM are normal. No scleral icterus.  Neck: Neck supple. No thyromegaly present.  Cardiovascular: Normal rate and regular rhythm.  Exam reveals no gallop and no friction rub.   No murmur heard. Pulmonary/Chest: No stridor. He has no wheezes. He has no rales. He exhibits no tenderness.  Abdominal: He exhibits no distension. There is tenderness. There is no rebound.  Minor right upper quadrant tenderness  Musculoskeletal: Normal range of motion. He exhibits no edema.  Lymphadenopathy:    He has no cervical adenopathy.  Neurological: He is oriented to person, place, and time. He exhibits normal muscle tone. Coordination normal.  Skin: No rash noted. No erythema.  Psychiatric: He has a normal mood and affect. His behavior is normal.     ED Treatments / Results  Labs (all labs ordered are listed, but only abnormal results are displayed) Labs Reviewed  CBC WITH DIFFERENTIAL/PLATELET - Abnormal; Notable for the following:       Result Value   WBC 16.8 (*)    RBC  3.93 (*)    Hemoglobin 11.0 (*)    HCT 33.3 (*)    Neutro Abs 15.1 (*)    All other components within normal limits  COMPREHENSIVE METABOLIC PANEL - Abnormal; Notable for the following:    Sodium 131 (*)    Potassium 3.4 (*)    Chloride 96 (*)    Glucose, Bld 105 (*)    Calcium 7.9 (*)    Albumin 2.4 (*)    AST 164 (*)    ALT 127 (*)    All other components within normal limits  LIPASE, BLOOD - Abnormal; Notable for the following:    Lipase 60 (*)    All other components within normal limits  URINALYSIS, ROUTINE W REFLEX  MICROSCOPIC - Abnormal; Notable for the following:    Hgb urine dipstick SMALL (*)    All other components within normal limits  CULTURE, BLOOD (ROUTINE X 2)  CULTURE, BLOOD (ROUTINE X 2)  URINE CULTURE  I-STAT CG4 LACTIC ACID, ED  I-STAT CG4 LACTIC ACID, ED    EKG  EKG Interpretation None       Radiology Ct Abdomen Pelvis W Contrast  Result Date: 03/22/2016 CLINICAL DATA:  Fever, nausea for 3 weeks. EXAM: CT ABDOMEN AND PELVIS WITH CONTRAST TECHNIQUE: Multidetector CT imaging of the abdomen and pelvis was performed using the standard protocol following bolus administration of intravenous contrast. CONTRAST:  140m ISOVUE-300 IOPAMIDOL (ISOVUE-300) INJECTION 61% COMPARISON:  August 20, 2012 FINDINGS: Lower chest: No acute abnormality. Hepatobiliary: There are gallstones within the gallbladder. There are nodular soft tissue density along the gallbladder wall. 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 There is 1 cm low-density lesion in the right lobe liver. There is 2.5 cm probable necrotic lymph node in the porta hepatis. Pancreas: Unremarkable. No pancreatic ductal dilatation or surrounding inflammatory changes. Spleen: Normal in size without focal abnormality. Adrenals/Urinary Tract: The adrenal glands are normal. There are simple cysts in both kidneys, largest in the right kidney measuring 9.4 x 10.4 cm unchanged. There are hyperdense lesions in the lower pole of the right kidney unchanged compared prior exam consistent with hemorrhagic cysts. There is no hydronephrosis bilaterally. The bladder is normal. Stomach/Bowel: There is a 7 cm segment of abnormal circumferential thickening of the sigmoid colon. There are adjacent abnormal enlarged pericolonic lymph node, the largest measures 1.3 cm. There is no small bowel obstruction. The patient status post prior appendectomy. Vascular/Lymphatic: The aorta is normal. There are small subcentimeter lymph nodes in the retroperitoneum.  Reproductive: Prostate is unremarkable. Other: There is small umbilical herniation of mesenteric fat. Musculoskeletal: Degenerative joint changes of the spine are identified. There are small lucencies within the left sacrum unchanged compared to prior CT likely chronic change. No other focal discrete lytic or blastic lesion is identified. IMPRESSION: Circumferential thickening of the sigmoid colon with adjacent abnormal enlarged lymph nodes. The findings are suspicious for colonic neoplasm with adjacent lymph node extension. Nodular soft tissue in the gallbladder wall with gallstones. There is probable associated necrotic lymph node in the porta hepatis. 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 Findings are suspicious for neoplasm question of gallbladder origin. Electronically Signed   By: WAbelardo DieselM.D.   On: 03/22/2016 19:40   Dg Abd Acute W/chest  Result Date: 03/22/2016 CLINICAL DATA:  Episodes of nausea and vomiting over the last 2 weeks. EXAM: DG ABDOMEN ACUTE W/ 1V CHEST COMPARISON:  None. FINDINGS: Heart size is normal. Mediastinal shadows are normal.  The lungs are clear. No effusions. No free air seen under the diaphragm. Abdominal bowel gas pattern is within normal limits. No abnormal calcifications or acute bone findings. IMPRESSION: Negative radiographs. Gas pattern within normal limits. No free air. Electronically Signed   By: Nelson Chimes M.D.   On: 03/22/2016 17:17    Procedures Procedures (including critical care time)  Medications Ordered in ED Medications  sodium chloride 0.9 % bolus 1,000 mL (0 mLs Intravenous Stopped 03/22/16 1807)  ondansetron (ZOFRAN) injection 4 mg (4 mg Intravenous Given 03/22/16 1641)  acetaminophen (TYLENOL) tablet 650 mg (650 mg Oral Given 03/22/16 1641)  sodium chloride 0.9 % bolus 2,000 mL (0 mLs Intravenous Stopped 03/22/16 1958)  piperacillin-tazobactam (ZOSYN) IVPB 3.375 g (0 g Intravenous Stopped 03/22/16 1835)  iopamidol (ISOVUE-300) 61 %  injection 100 mL (100 mLs Intravenous Contrast Given 03/22/16 1847)  sodium chloride 0.9 % bolus 1,000 mL (1,000 mLs Intravenous New Bag/Given 03/22/16 2001)     Initial Impression / Assessment and Plan / ED Course  I have reviewed the triage vital signs and the nursing notes.  Pertinent labs & imaging results that were available during my care of the patient were reviewed by me and considered in my medical decision making (see chart for details).  Clinical Course     Patient with fever chills minor abdominal discomfort. CT scan shows possible: Neoplasm and possible gallbladder neoplasm. Patient will be admitted to medicine. Surgery and GI will consult  Final Clinical Impressions(s) / ED Diagnoses   Final diagnoses:  Nausea vomiting and diarrhea    New Prescriptions New Prescriptions   No medications on file     Milton Ferguson, MD 03/22/16 2020

## 2016-03-22 NOTE — ED Triage Notes (Signed)
Pt states he began vomiting over a week ago intermittently.  Last vomited on Christmas.  Today presents with chills, shaking, fatigue, and fever this morning.

## 2016-03-22 NOTE — Assessment & Plan Note (Signed)
Suspected as source of infection. See abnormal CT results. Plan: Cultures ordered. IV Zosyn. Clear liquids for now, then NPO after midnight. ED physician contacted general surgeon, Dr. Arnoldo Morale, who will consult on patient.

## 2016-03-22 NOTE — Assessment & Plan Note (Signed)
New issue found on CT. Circumferential thickening in sigmoid colon with enlarged surrounding lymph nodes. Never had colonoscopy. No history of decreased caliber of stools or changes in BM. Plan: Consult GI and general surgeon. Patient informed of CT findings.

## 2016-03-22 NOTE — ED Notes (Signed)
Pt states 12/16 vomiting began and on 12/18, 12/19, 12/20, the vomiting stopped.  Vomiting started back up again on the 12/21 and 12/22.  Pt went to urgent care 12/22.  He was given an antiemetic on 12/22 and the slowed down and he only had emesis on 12/24 and has not vomited since.  Pt presents with malaise, chills, and nausea

## 2016-03-22 NOTE — Assessment & Plan Note (Signed)
Mild. na 131 on admission. Likely due to dehydration from poor PO intake over last week. Plan: Trial of IVFs. Recheck in am.

## 2016-03-22 NOTE — Assessment & Plan Note (Signed)
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 Plan: Consult general surgeon and GI. Continue workup of abnormal findings of gallbladder and sigmoid colon.

## 2016-03-22 NOTE — Assessment & Plan Note (Signed)
Possible diagnosis. Patient has mild anemia with Hb 11 on admission, with no history of any medical problems. Reported black diarrhea that started on day of admission. Plan: Hemoccult stool. Anemia labs including iron panel, ferritin, retic count, B12, folate have been ordered. Recheck CBC in am.

## 2016-03-22 NOTE — ED Notes (Signed)
Pt urinated while in xray and sample was not obtained.

## 2016-03-22 NOTE — Assessment & Plan Note (Signed)
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 Associated necrotic lymph node adjacent. Plan: General surgeon consulted, Dr. Arnoldo Morale. Will consult GI also. Patient informed of CT findings.

## 2016-03-23 ENCOUNTER — Encounter (HOSPITAL_COMMUNITY)
Admission: EM | Disposition: A | Discharge status: Other Acute Inpatient Hospital | Payer: Self-pay | Source: Home / Self Care | Attending: Internal Medicine

## 2016-03-23 ENCOUNTER — Encounter (HOSPITAL_COMMUNITY): Payer: Self-pay | Admitting: Gastroenterology

## 2016-03-23 DIAGNOSIS — R933 Abnormal findings on diagnostic imaging of other parts of digestive tract: Secondary | ICD-10-CM

## 2016-03-23 DIAGNOSIS — R112 Nausea with vomiting, unspecified: Secondary | ICD-10-CM

## 2016-03-23 DIAGNOSIS — R197 Diarrhea, unspecified: Secondary | ICD-10-CM

## 2016-03-23 DIAGNOSIS — K5730 Diverticulosis of large intestine without perforation or abscess without bleeding: Secondary | ICD-10-CM

## 2016-03-23 DIAGNOSIS — A419 Sepsis, unspecified organism: Principal | ICD-10-CM

## 2016-03-23 DIAGNOSIS — D374 Neoplasm of uncertain behavior of colon: Secondary | ICD-10-CM

## 2016-03-23 HISTORY — PX: ESOPHAGOGASTRODUODENOSCOPY: SHX5428

## 2016-03-23 HISTORY — PX: COLONOSCOPY: SHX5424

## 2016-03-23 LAB — COMPREHENSIVE METABOLIC PANEL
ALT: 84 U/L — ABNORMAL HIGH (ref 17–63)
ANION GAP: 4 — AB (ref 5–15)
AST: 89 U/L — ABNORMAL HIGH (ref 15–41)
Albumin: 1.6 g/dL — ABNORMAL LOW (ref 3.5–5.0)
Alkaline Phosphatase: 72 U/L (ref 38–126)
BILIRUBIN TOTAL: 0.3 mg/dL (ref 0.3–1.2)
BUN: 11 mg/dL (ref 6–20)
CALCIUM: 6.4 mg/dL — AB (ref 8.9–10.3)
CO2: 22 mmol/L (ref 22–32)
Chloride: 108 mmol/L (ref 101–111)
Creatinine, Ser: 0.77 mg/dL (ref 0.61–1.24)
GFR calc non Af Amer: >60 mL/min (ref >60)
GLUCOSE: 99 mg/dL (ref 65–99)
POTASSIUM: 4 mmol/L (ref 3.5–5.1)
SODIUM: 134 mmol/L — AB (ref 135–145)
TOTAL PROTEIN: 5.6 g/dL — AB (ref 6.5–8.1)

## 2016-03-23 LAB — IRON AND TIBC
Iron: 8 ug/dL — ABNORMAL LOW (ref 45–182)
Saturation Ratios: 7 % — ABNORMAL LOW (ref 17.9–39.5)
TIBC: 120 ug/dL — ABNORMAL LOW (ref 250–450)
UIBC: 112 ug/dL

## 2016-03-23 LAB — CBC
HEMATOCRIT: 28.5 % — AB (ref 39.0–52.0)
HEMOGLOBIN: 9.3 g/dL — AB (ref 13.0–17.0)
MCH: 28.2 pg (ref 26.0–34.0)
MCHC: 32.6 g/dL (ref 30.0–36.0)
MCV: 86.4 fL (ref 78.0–100.0)
Platelets: 232 10*3/uL (ref 150–400)
RBC: 3.3 MIL/uL — AB (ref 4.22–5.81)
RDW: 13.3 % (ref 11.5–15.5)
WBC: 11.2 10*3/uL — ABNORMAL HIGH (ref 4.0–10.5)

## 2016-03-23 LAB — PROTIME-INR
INR: 1.39
PROTHROMBIN TIME: 17.2 s — AB (ref 11.4–15.2)

## 2016-03-23 LAB — VITAMIN B12: Vitamin B-12: 438 pg/mL (ref 180–914)

## 2016-03-23 LAB — HEPARIN LEVEL (UNFRACTIONATED): Heparin Unfractionated: <0.1 IU/mL — ABNORMAL LOW (ref 0.30–0.70)

## 2016-03-23 LAB — LACTIC ACID, PLASMA: LACTIC ACID, VENOUS: 1 mmol/L (ref 0.5–1.9)

## 2016-03-23 LAB — APTT: aPTT: 39 seconds — ABNORMAL HIGH (ref 24–36)

## 2016-03-23 LAB — FERRITIN: Ferritin: 702 ng/mL — ABNORMAL HIGH (ref 24–336)

## 2016-03-23 LAB — CORTISOL: CORTISOL PLASMA: 27.8 ug/dL

## 2016-03-23 LAB — OCCULT BLOOD X 1 CARD TO LAB, STOOL: Fecal Occult Bld: NEGATIVE

## 2016-03-23 LAB — MAGNESIUM: MAGNESIUM: 1.9 mg/dL (ref 1.7–2.4)

## 2016-03-23 SURGERY — EGD (ESOPHAGOGASTRODUODENOSCOPY)
Anesthesia: Moderate Sedation

## 2016-03-23 MED ORDER — LIDOCAINE VISCOUS 2 % MT SOLN
OROMUCOSAL | Status: DC | PRN
  Administered 2016-03-23: 1 via OROMUCOSAL

## 2016-03-23 MED ORDER — MIDAZOLAM HCL 5 MG/5ML IJ SOLN
INTRAMUSCULAR | Status: DC | PRN
  Administered 2016-03-23 (×2): 2 mg via INTRAVENOUS
  Administered 2016-03-23: 1 mg via INTRAVENOUS

## 2016-03-23 MED ORDER — SODIUM CHLORIDE 0.9 % IV SOLN
Freq: Once | INTRAVENOUS | Status: AC
  Administered 2016-03-23: 1000 mL via INTRAVENOUS

## 2016-03-23 MED ORDER — LIDOCAINE VISCOUS 2 % MT SOLN
OROMUCOSAL | Status: AC
  Filled 2016-03-23: qty 15

## 2016-03-23 MED ORDER — HEPARIN (PORCINE) IN NACL 100-0.45 UNIT/ML-% IJ SOLN
1500.0000 [IU]/h | INTRAMUSCULAR | Status: DC
  Filled 2016-03-23: qty 250

## 2016-03-23 MED ORDER — HEPARIN (PORCINE) IN NACL 100-0.45 UNIT/ML-% IJ SOLN
2150.0000 [IU]/h | INTRAMUSCULAR | Status: DC
  Administered 2016-03-23: 1450 [IU]/h via INTRAVENOUS
  Administered 2016-03-24: 2150 [IU]/h via INTRAVENOUS
  Administered 2016-03-24: 1450 [IU]/h via INTRAVENOUS
  Filled 2016-03-23 (×2): qty 250

## 2016-03-23 MED ORDER — MIDAZOLAM HCL 5 MG/5ML IJ SOLN
INTRAMUSCULAR | Status: AC
  Filled 2016-03-23: qty 10

## 2016-03-23 MED ORDER — PANTOPRAZOLE SODIUM 40 MG PO TBEC
40.0000 mg | DELAYED_RELEASE_TABLET | Freq: Every day | ORAL | Status: DC
  Administered 2016-03-23 – 2016-03-24 (×2): 40 mg via ORAL
  Filled 2016-03-23 (×2): qty 1

## 2016-03-23 MED ORDER — SPOT INK MARKER SYRINGE KIT
PACK | SUBMUCOSAL | Status: DC | PRN
  Administered 2016-03-23: 2 mL via SUBMUCOSAL

## 2016-03-23 MED ORDER — MEPERIDINE HCL 100 MG/ML IJ SOLN
INTRAMUSCULAR | Status: DC | PRN
  Administered 2016-03-23 (×3): 25 mg via INTRAVENOUS

## 2016-03-23 MED ORDER — HEPARIN BOLUS VIA INFUSION
4000.0000 [IU] | Freq: Once | INTRAVENOUS | Status: AC
  Administered 2016-03-23: 4000 [IU] via INTRAVENOUS
  Filled 2016-03-23: qty 4000

## 2016-03-23 MED ORDER — SODIUM CHLORIDE 0.9 % IV SOLN: INTRAVENOUS | Status: DC

## 2016-03-23 MED ORDER — SODIUM CHLORIDE 0.9 % IV SOLN
INTRAVENOUS | Status: DC
  Administered 2016-03-23: 12:00:00 via INTRAVENOUS

## 2016-03-23 MED ORDER — MEPERIDINE HCL 100 MG/ML IJ SOLN
INTRAMUSCULAR | Status: AC
  Filled 2016-03-23: qty 2

## 2016-03-23 NOTE — Op Note (Signed)
[facility] Patient Name: Brett Bright Procedure Date: 03/23/2016 1:53 PM MRN: [id number] Date of Birth: Feb 24, 1961 Attending MD: Barney Drain , MD CSN: [id number] Age: 56 Admit Type: Inpatient Procedure:                Upper GI endoscopy WITH COLD FORCEPS BIOPSY Indications:              Abnormal CT of the GI tract, Nausea with vomiting Providers:                Barney Drain, MD, Lurline Del, RN, Aram Candela Referring MD:             Chipper Herb Medicines:                Meperidine 75 mg IV, Midazolam 5 mg IV Complications:            No immediate complications. Estimated Blood Loss:     Estimated blood loss was minimal. Procedure:                Pre-Anesthesia Assessment:                           - Prior to the procedure, a History and Physical                            was performed, and patient medications and                            allergies were reviewed. The patient's tolerance of                            previous anesthesia was also reviewed. The risks                            and benefits of the procedure and the sedation                            options and risks were discussed with the patient.                            All questions were answered, and informed consent                            was obtained. Prior Anticoagulants: The patient has                            taken Lovenox (enoxaparin), last dose was day of                            procedure. ASA Grade Assessment: II - A patient                            with mild systemic disease. After reviewing the                            risks and benefits, the patient was deemed in  satisfactory condition to undergo the procedure.                            After obtaining informed consent, the endoscope was                            passed under direct vision. Throughout the                            procedure, the patient's blood pressure, pulse, and                 oxygen saturations were monitored continuously. The                            [id number] ([id number]) scope was introduced through the                            mouth, and advanced to the second part of duodenum.                            The upper GI endoscopy was somewhat difficult due                            to the patient's cardiovascular instability                            (hypotension). Successful completion of the                            procedure was aided by performing the maneuvers                            documented (below) in this report. The patient                            tolerated the procedure well. Scope In: 2:19:43 PM Scope Out: 2:25:45 PM Total Procedure Duration: 0 hours 6 minutes 2 seconds  Findings:      The examined esophagus was normal. NO ESOPHAGEAL, GASTRIC, OR DUODENAL       VARICES      Patchy mild inflammation characterized by congestion (edema) and       erythema was found in the gastric antrum. Biopsies were taken with a       cold forceps for Helicobacter pylori testing.      Multiple small sessile polyps with no bleeding and no stigmata of recent       bleeding were found in the cardia, in the gastric fundus and in the       gastric body. This was biopsied with a cold forceps for histology.      Patchy mild inflammation characterized by congestion (edema) and       erythema was found in the duodenal bulb.      The second portion of the duodenum was normal. Impression:               - NO SOURCE FOR NAUSEA/VOMITING OR DIARRHEA  IDENTIFIED                           - MILD Gastritis.                           - Multiple gastric polyps.                           - MILD Duodenitis. Moderate Sedation:      Moderate (conscious) sedation was administered by the endoscopy nurse       and supervised by the endoscopist. The following parameters were       monitored: oxygen saturation, heart rate, blood pressure,  and response       to care. Total physician intraservice time was 45 minutes. Recommendation:           - Return patient to hospital ward.                           - Cardiac diet.                           - Use Protonix (pantoprazole) 40 mg PO daily.                           - Continue present medications.                           - Await pathology results. Procedure Code(s):        --- Professional ---                           [phone], Esophagogastroduodenoscopy, flexible,                            transoral; with biopsy, single or multiple                           99152, Moderate sedation services provided by the                            same physician or other qualified health care                            professional performing the diagnostic or                            therapeutic service that the sedation supports,                            requiring the presence of an independent trained                            observer to assist in the monitoring of the                            patient's level of consciousness and physiological  status; initial 15 minutes of intraservice time,                            patient age 51 years or older                           [phone], Moderate sedation services; each additional                            15 minutes intraservice time                           [phone], Moderate sedation services; each additional                            15 minutes intraservice time Diagnosis Code(s):        --- Professional ---                           K29.70, Gastritis, unspecified, without bleeding                           K31.7, Polyp of stomach and duodenum                           K29.80, Duodenitis without bleeding                           R11.2, Nausea with vomiting, unspecified                           R93.3, Abnormal findings on diagnostic imaging of                            other parts of digestive tract CPT  copyright 2016 [employer]. All rights reserved. The codes documented in this report are preliminary and upon coder review may  be revised to meet current compliance requirements. Barney Drain, MD Barney Drain, MD 03/23/2016 3:14:26 PM This report has been signed electronically. Number of Addenda: 0

## 2016-03-23 NOTE — Progress Notes (Signed)
CRITICAL VALUE ALERT  Critical value received:  Calcium 6.4  Date of notification:  03/23/2016  Time of notification:  0635  Critical value read back:Yes.    Nurse who received alert:  Ames Dura, RN  MD notified (1st page):  Dr. Dena Billet  Time of first page:  [phone]

## 2016-03-23 NOTE — Progress Notes (Signed)
PROGRESS NOTE    Brett Bright  EOF:[id number] DOB: 1960/09/18 DOA: 03/22/2016 PCP: No PCP Per Patient     Brief Narrative:  56 year old man admitted from home on 1/1 due to complaints of fatigue, fever, nausea and vomiting. On CT scan found to have circumferential thickening of the sigmoid colon with adjacent abnormal enlarged lymph nodes concerning for colonic neoplasm as well as a nodular soft tissue in the gallbladder wall with gallstones (suspicious for gallbladder malignancy) and thrombosis of the hepatic portal brain.   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 -Plan to continue Zosyn for now. -Sepsis parameters continue to improve.  Concern for neoplasm of colon and gallbladder -GI and surgery have been consulted. -Patient is going for colonoscopy today, further recommendations pending results. -We'll likely need cholecystectomy sooner rather than later.   Right hepatic portal vein thrombosis -Unclear if anticoagulation is needed. [facility] request hematology consultation for further recommendations.  Hyponatremia -Improving with IV fluids, likely due to GI losses and dehydration with prolonged emesis.  Hypokalemia -Replaced, magnesium within normal limits at 1.9.     DVT prophylaxis: lovenox Code Status: full code Family Communication: patient only Disposition Plan: to be determined  Consultants:   GI  General surgery  Procedures:   For colonoscopy 1/2   Antimicrobials:  Anti-infectives    Start     Dose/Rate  Route Frequency Ordered Stop   03/23/16 0200  piperacillin-tazobactam (ZOSYN) IVPB 3.375 g     3.375 g 12.5 mL/hr over 240 Minutes Intravenous Every 8 hours 03/22/16 2220     03/22/16 1745  piperacillin-tazobactam (ZOSYN) IVPB 3.375 g     3.375 g 100 mL/hr over 30 Minutes Intravenous  Once 03/22/16 1736 03/22/16 1835       Subjective: Abdominal pain resolved, emesis resolved   Objective: Vitals:   03/23/16 1450 03/23/16 1455 03/23/16 1500 03/23/16 1520  BP: (!) 82/44 102/63 102/61 106/65  Pulse: (!) 103 (!) 101 (!) 101 100  Resp: (!) 25 (!) 26 (!) 26 (!) 22  Temp:    98.5 F (36.9 C)  TempSrc:    Oral  SpO2: 100% 100% 100% 100%  Weight:      Height:        Intake/Output Summary (Last 24 hours) at 03/23/16 1548 Last data filed at 03/23/16 1516  Gross per 24 hour  Intake             7740 ml  Output                0 ml  Net             7740 ml   Filed Weights   03/22/16 1559 03/22/16 2151  Weight: 92.1 kg (203 lb) 94.5 kg (208 lb 6.4 oz)    Examination:  General exam: Alert, awake, oriented x 3 Respiratory system: Clear to auscultation. Respiratory effort normal. Cardiovascular system:RRR. No murmurs, rubs, gallops. Gastrointestinal system: Abdomen is nondistended, soft and nontender. No organomegaly or masses felt. Normal bowel sounds heard. Central nervous system: Alert  and oriented. No focal neurological deficits. Extremities: No C/C/E, +pedal pulses Skin: No rashes, lesions or ulcers Psychiatry: Judgement and insight appear normal. Mood & affect appropriate.     Data Reviewed: I have personally reviewed following labs and imaging studies  CBC:  Recent Labs Lab 03/22/16 1640 03/23/16 0519  WBC 16.8* 11.2*  NEUTROABS 15.1*  --   HGB 11.0* 9.3*  HCT 33.3* 28.5*  MCV 84.7 86.4  PLT 324 494   Basic Metabolic Panel:  Recent Labs Lab 03/22/16 1640 03/22/16 1748 03/23/16 0519  NA 131*  --  134*  K 3.4*  --  4.0  CL 96*  --  108  CO2 24  --  22    GLUCOSE 105*  --  99  BUN 11  --  11  CREATININE 0.88  --  0.77  CALCIUM 7.9*  --  6.4*  MG  --  1.5* 1.9   GFR: Estimated Creatinine Clearance: 119.7 mL/min (by C-G formula based on SCr of 0.77 mg/dL). Liver Function Tests:  Recent Labs Lab 03/22/16 1640 03/23/16 0519  AST 164* 89*  ALT 127* 84*  ALKPHOS 104 72  BILITOT 0.8 0.3  PROT 7.9 5.6*  ALBUMIN 2.4* 1.6*    Recent Labs Lab 03/22/16 1640  LIPASE 60*   No results for input(s): AMMONIA in the last 168 hours. Coagulation Profile:  Recent Labs Lab 03/22/16 2207 03/23/16 0519  INR 1.41 1.39   Cardiac Enzymes: No results for input(s): CKTOTAL, CKMB, CKMBINDEX, TROPONINI in the last 168 hours. BNP (last 3 results) No results for input(s): PROBNP in the last 8760 hours. HbA1C: No results for input(s): HGBA1C in the last 72 hours. CBG: No results for input(s): GLUCAP in the last 168 hours. Lipid Profile: No results for input(s): CHOL, HDL, LDLCALC, TRIG, CHOLHDL, LDLDIRECT in the last 72 hours. Thyroid Function Tests: No results for input(s): TSH, T4TOTAL, FREET4, T3FREE, THYROIDAB in the last 72 hours. Anemia Panel:  Recent Labs  03/22/16 1748  VITAMINB12 438  FERRITIN 702*  TIBC 120*  IRON 8*  RETICCTPCT 0.9   Urine analysis:    Component Value Date/Time   COLORURINE YELLOW 03/22/2016 1959   APPEARANCEUR CLEAR 03/22/2016 1959   LABSPEC 1.026 03/22/2016 1959   PHURINE 7.0 03/22/2016 1959   GLUCOSEU NEGATIVE 03/22/2016 1959   HGBUR SMALL (A) 03/22/2016 1959   BILIRUBINUR NEGATIVE 03/22/2016 1959   KETONESUR NEGATIVE 03/22/2016 1959   PROTEINUR NEGATIVE 03/22/2016 1959   UROBILINOGEN 0.2 08/20/2012 0923   NITRITE NEGATIVE 03/22/2016 1959   LEUKOCYTESUR NEGATIVE 03/22/2016 1959   Sepsis Labs: @LABRCNTIP (procalcitonin:4,lacticidven:4)  ) Recent Results (from the past 240 hour(s))  Blood Culture (routine x 2)     Status: None (Preliminary result)   Collection Time: 03/22/16  5:46 PM   Result Value Ref Range Status   Specimen Description BLOOD RIGHT ARM  Final   Special Requests BOTTLES DRAWN AEROBIC AND ANAEROBIC 6CC EACH  Final   Culture NO GROWTH < 24 HOURS  Final   Report Status PENDING  Incomplete  Blood Culture (routine x 2)     Status: None (Preliminary result)   Collection Time: 03/22/16  5:49 PM  Result Value Ref Range Status   Specimen Description BLOOD LEFT ARM  Final   Special Requests BOTTLES DRAWN AEROBIC AND ANAEROBIC 6CC EACH  Final   Culture NO GROWTH < 24 HOURS  Final   Report Status PENDING  Incomplete         Radiology Studies: Ct  Abdomen Pelvis W Contrast  Result Date: 03/22/2016 CLINICAL DATA:  Fever, nausea for 3 weeks. EXAM: CT ABDOMEN AND PELVIS WITH CONTRAST TECHNIQUE: Multidetector CT imaging of the abdomen and pelvis was performed using the standard protocol following bolus administration of intravenous contrast. CONTRAST:  167m ISOVUE-300 IOPAMIDOL (ISOVUE-300) INJECTION 61% COMPARISON:  August 20, 2012 FINDINGS: Lower chest: No acute abnormality. Hepatobiliary: There are gallstones within the gallbladder. There are nodular soft tissue density along the gallbladder wall. 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 There is 1 cm low-density lesion in the right lobe liver. There is 2.5 cm probable necrotic lymph node in the porta hepatis. Pancreas: Unremarkable. No pancreatic ductal dilatation or surrounding inflammatory changes. Spleen: Normal in size without focal abnormality. Adrenals/Urinary Tract: The adrenal glands are normal. There are simple cysts in both kidneys, largest in the right kidney measuring 9.4 x 10.4 cm unchanged. There are hyperdense lesions in the lower pole of the right kidney unchanged compared prior exam consistent with hemorrhagic cysts. There is no hydronephrosis bilaterally. The bladder is normal. Stomach/Bowel: There is a 7 cm segment of abnormal circumferential thickening of the sigmoid colon. There are adjacent  abnormal enlarged pericolonic lymph node, the largest measures 1.3 cm. There is no small bowel obstruction. The patient status post prior appendectomy. Vascular/Lymphatic: The aorta is normal. There are small subcentimeter lymph nodes in the retroperitoneum. Reproductive: Prostate is unremarkable. Other: There is small umbilical herniation of mesenteric fat. Musculoskeletal: Degenerative joint changes of the spine are identified. There are small lucencies within the left sacrum unchanged compared to prior CT likely chronic change. No other focal discrete lytic or blastic lesion is identified. IMPRESSION: Circumferential thickening of the sigmoid colon with adjacent abnormal enlarged lymph nodes. The findings are suspicious for colonic neoplasm with adjacent lymph node extension. Nodular soft tissue in the gallbladder wall with gallstones. There is probable associated necrotic lymph node in the porta hepatis. 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 Findings are suspicious for neoplasm question of gallbladder origin. Electronically Signed   By: WAbelardo DieselM.D.   On: 03/22/2016 19:40   Dg Abd Acute W/chest  Result Date: 03/22/2016 CLINICAL DATA:  Episodes of nausea and vomiting over the last 2 weeks. EXAM: DG ABDOMEN ACUTE W/ 1V CHEST COMPARISON:  None. FINDINGS: Heart size is normal. Mediastinal shadows are normal. The lungs are clear. No effusions. No free air seen under the diaphragm. Abdominal bowel gas pattern is within normal limits. No abnormal calcifications or acute bone findings. IMPRESSION: Negative radiographs. Gas pattern within normal limits. No free air. Electronically Signed   By: MNelson ChimesM.D.   On: 03/22/2016 17:17        Scheduled Meds: . lidocaine      . meperidine      . midazolam      . pantoprazole  40 mg Oral QAC breakfast  . piperacillin-tazobactam (ZOSYN)  IV  3.375 g Intravenous Q8H   Continuous Infusions: . heparin 1,450 Units/hr (03/23/16 1548)      LOS: 1 day    Time spent: 25 minutes . Greater than 50% of this time was spent in direct contact with the patient coordinating care.     HLelon Frohlich MD [facility] Pager [phone] If 7PM-7AM, please contact night-coverage [email or web address] Password [facility]1/04/2016, 3:48 PM

## 2016-03-23 NOTE — Progress Notes (Signed)
Initial Nutrition Assessment  DOCUMENTATION CODES:   Obesity unspecified  INTERVENTION:  RD will follow for diet advancement and add oral supplement if indicated    NUTRITION DIAGNOSIS:   Inadequate oral intake related to acute illness as evidenced by per patient/family report.   GOAL:   Patient will meet greater than or equal to 90% of their needs   MONITOR:   PO intake, Labs  REASON FOR ASSESSMENT:   Malnutrition Screening Tool    ASSESSMENT:   The patient is a 56 yo man with no medical problems who presents with nausea, chills, and inability to eat.  He states his symptoms started just over 2 weeks ago, when he became sick on 03/06/16 with nausea, vomiting. He started to feel a bit better then on 03/14/16 has severe vomiting; he has not had any further vomiting since that day but has had severe nausea and poor appetite. He has not been able to eat anything in the last week because nothing tastes good and he feels full all the time; he has been able to drink liquids. He reports when he does take PO he gets "acid" in his mouth, and has been having significant acid reflux.   His weight is within 2# of usual which is not significant for timeframe. He is currently NPO for pending colonoscopy. CT shows possible gallbladder neoplasm. He follows a regular diet at home and had been eating normally until this acute onset of symptoms.   NFPE: no acute findings of muscle loss or fat depletion.   Labs: Reviewed and results pending: CA 19-9 and carcinoembryonic antigen   Recent Labs Lab 03/22/16 1640 03/22/16 1748 03/23/16 0519  NA 131*  --  134*  K 3.4*  --  4.0  CL 96*  --  108  CO2 24  --  22  BUN 11  --  11  CREATININE 0.88  --  0.77  CALCIUM 7.9*  --  6.4*  MG  --  1.5* 1.9  GLUCOSE 105*  --  99     Diet Order:  Diet NPO time specified Except for: Sips with Meds, Ice Chips  Skin:  Reviewed, no issues  Last BM:  03/22/16  Height:   Ht Readings from Last 1  Encounters:  03/22/16 5' 9"  (1.753 m)    Weight:   Wt Readings from Last 1 Encounters:  03/22/16 208 lb 6.4 oz (94.5 kg)    Ideal Body Weight:  73 kg  BMI:  Body mass index is 30.78 kg/m.  Estimated Nutritional Needs:   Kcal:  1950-2140  Protein:  109-125  Fluid:  2.0-2.2 liters daily  EDUCATION NEEDS:   No education needs identified at this time  Colman Cater MS,RD,CSG,LDN Office: #[phone] Pager: [phone]

## 2016-03-23 NOTE — Consult Note (Signed)
Referring Provider: Dr. Dena Billet  Primary Care Physician:  No PCP Per Patient Primary Gastroenterologist:  Dr. Oneida Alar   Date of Admission: 03/22/16 Date of Consultation: 03/23/16  Reason for Consultation:  Sigmoid colon mass, gallbladder mass   HPI:  Brett Bright is a 56 y.o. year old male presenting with several week history of fatigue, intermittent nausea and vomiting. Presented to the ED yesterday with CT findings of circumferential thickening of the sigmoid colon with adjacent abnormal lymph nodes, suspicious for colonic neoplasm. 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 Leukocytosis on admission at 16.8, improved today to 11.2, fever max 103.2 on admission now resolved. On empiric antibiotics with Zosyn. Tachycardia resolved, afebrile this morning. General Surgery consultation on file, with further plans after GI evaluation with colonoscopy.     Around Dec 16th started feeling bad. After a few days he improved, then several days later started vomiting on a Friday every 15 to 20 minutes. Went to Urgent Care and given nausea medication. No nausea since Sunday evening the 24th. Christmas Day had no energy, went back to work on the 26th. Symptoms of fatigue persisted and decided to come to the ED for evaluation yesterday. Felt fatigued. No abdominal pain. Decreased appetite since the 23rd. No dysphagia. No diarrhea till coming to the hospital and now has looser stool that is "brownish black". Had 2 overnight. Heme negative. Small amount of bright red blood several weeks ago in setting of what he thought was hemorrhoids. Thinks he has lost weight. Baseline weight usually around 210, today 208. No significant weight loss. Tries to eat healthy at home. Wife has lost 160 lbs on weight watchers, so he is trying to eat healthy as well. No prior colonoscopy.    Past Medical History:  Diagnosis Date  . Medical history non-contributory     Past Surgical History:  Procedure Laterality Date  . APPENDECTOMY  1975   in [place]  . VASECTOMY      Prior to Admission medications   Not on File    Current Facility-Administered Medications  Medication Dose Route Frequency Provider Last Rate Last Dose  . acetaminophen (TYLENOL) tablet 650 mg  650 mg Oral Q6H PRN Tacey Ruiz, MD   650 mg at 03/22/16 2219   Or  . acetaminophen (TYLENOL) suppository 650 mg  650 mg Rectal Q6H PRN Tacey Ruiz, MD      . enoxaparin (LOVENOX) injection 40 mg  40 mg Subcutaneous Q24H Tacey Ruiz, MD   40 mg at 03/23/16 0122  . morphine 2 MG/ML injection 2 mg  2 mg Intravenous Q2H PRN Tacey Ruiz, MD      . oxyCODONE (Oxy IR/ROXICODONE) immediate release tablet 5 mg  5 mg Oral Q4H PRN Tacey Ruiz, MD      . piperacillin-tazobactam (ZOSYN) IVPB 3.375 g  3.375 g Intravenous Q8H Tacey Ruiz, MD   3.375 g at 03/23/16 0122    Allergies as of 03/22/2016  . (No Known Allergies)    Family History  Problem Relation Age of Onset  . Stroke Mother   . Cancer Maternal Grandfather     Unknown type of cancer  . Heart attack Paternal Grandmother   . Cancer Maternal Aunt     Possibly lung cancer  . Cancer Paternal Uncle     Unknown type of cancer  . Cancer Paternal Uncle  Unknown type of cancer  . Colon cancer Neg Hx     Social History   Social History  . Marital status: Married    Spouse name: N/A  . Number of children: 3  . Years of education: N/A   Occupational History  . Parts Clerk     At [employer]    Social History Main Topics  . Smoking status: Never Smoker  . Smokeless tobacco: Never Used  . Alcohol use No  . Drug use: No  . Sexual activity: Not on file   Other Topics Concern  . 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   He is married and has  3 children, who are ages 5, 73, and 71 as of 2017.   The 34 and 81 year old children are still living at home, but the 55 yo is married and lives elsewhere.   The patient lives in [place], [place].      His primary care is [facility] in [place], [place].    Review of Systems: Gen: see HPI  CV: Denies chest pain, heart palpitations, syncope, edema  Resp: Denies shortness of breath with rest, cough, wheezing GI: see HPI  GU : Denies urinary burning, urinary frequency, urinary incontinence.  MS: Denies joint pain,swelling, cramping Derm: Denies rash, itching, dry skin Psych: Denies depression, anxiety,confusion, or memory loss Heme: see HPI   Physical Exam: Vital signs in last 24 hours: Temp:  [97.6 F (36.4 C)-103.2 F (39.6 C)] 97.6 F (36.4 C) (01/02 0603) Pulse Rate:  [93-129] 93 (01/02 0603) Resp:  [20-29] 20 (01/02 0603) BP: (96-140)/(55-65) 96/56 (01/02 0603) SpO2:  [93 %-99 %] 99 % (01/02 0603) Weight:  [203 lb (92.1 kg)-208 lb 6.4 oz (94.5 kg)] 208 lb 6.4 oz (94.5 kg) (01/01 2151) Last BM Date: 03/22/16 General:   Alert,  Well-developed, well-nourished, pleasant and cooperative in NAD Head:  Normocephalic and atraumatic. Eyes:  Sclera clear, no icterus.   Conjunctiva pink. Ears:  Normal auditory acuity. Nose:  No deformity, discharge,  or lesions. Mouth:  No deformity or lesions, dentition normal. Lungs:  Clear throughout to auscultation.   No wheezes, crackles, or rhonchi. No acute distress. Heart:  Regular rate and rhythm; no murmurs, clicks, rubs,  or gallops. Abdomen:  Soft, nontender and nondistended. No masses, hepatosplenomegaly or hernias noted. Normal bowel sounds, without guarding, and without rebound.   Rectal:  Deferred until time of colonoscopy.   Msk:  Symmetrical without gross deformities. Normal posture. Extremities:  Without  edema. Neurologic:  Alert and  oriented x4 Skin:  Intact without significant lesions or rashes. Psych:  Alert and  cooperative. Normal mood and affect.  Intake/Output from previous day: 01/01 0701 - 01/02 0700 In: 7290 [P.O.:240; IV Piggyback:4050] Out: -  Intake/Output this shift: No intake/output data recorded.  Lab Results:  Recent Labs  03/22/16 1640 03/23/16 0519  WBC 16.8* 11.2*  HGB 11.0* 9.3*  HCT 33.3* 28.5*  PLT 324 232   BMET  Recent Labs  03/22/16 1640 03/23/16 0519  NA 131* 134*  K 3.4* 4.0  CL 96* 108  CO2 24 22  GLUCOSE 105* 99  BUN 11 11  CREATININE 0.88 0.77  CALCIUM 7.9* 6.4*   LFT  Recent Labs  03/22/16 1640 03/23/16 0519  PROT 7.9 5.6*  ALBUMIN 2.4* 1.6*  AST 164* 89*  ALT 127* 84*  ALKPHOS  104 72  BILITOT 0.8 0.3   PT/INR  Recent Labs  03/22/16 2207 03/23/16 0519  LABPROT 17.4* 17.2*  INR 1.41 1.39   Lab Results  Component Value Date   LIPASE 60 (H) 03/22/2016    Studies/Results: Ct Abdomen Pelvis W Contrast  Result Date: 03/22/2016 CLINICAL DATA:  Fever, nausea for 3 weeks. EXAM: CT ABDOMEN AND PELVIS WITH CONTRAST TECHNIQUE: Multidetector CT imaging of the abdomen and pelvis was performed using the standard protocol following bolus administration of intravenous contrast. CONTRAST:  153m ISOVUE-300 IOPAMIDOL (ISOVUE-300) INJECTION 61% COMPARISON:  August 20, 2012 FINDINGS: Lower chest: No acute abnormality. Hepatobiliary: There are gallstones within the gallbladder. There are nodular soft tissue density along the gallbladder wall. 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 There is 1 cm low-density lesion in the right lobe liver. There is 2.5 cm probable necrotic lymph node in the porta hepatis. Pancreas: Unremarkable. No pancreatic ductal dilatation or surrounding inflammatory changes. Spleen: Normal in size without focal abnormality. Adrenals/Urinary Tract: The adrenal glands are normal. There are simple cysts in both kidneys, largest in the right kidney measuring 9.4 x 10.4 cm unchanged. There are hyperdense lesions in the lower  pole of the right kidney unchanged compared prior exam consistent with hemorrhagic cysts. There is no hydronephrosis bilaterally. The bladder is normal. Stomach/Bowel: There is a 7 cm segment of abnormal circumferential thickening of the sigmoid colon. There are adjacent abnormal enlarged pericolonic lymph node, the largest measures 1.3 cm. There is no small bowel obstruction. The patient status post prior appendectomy. Vascular/Lymphatic: The aorta is normal. There are small subcentimeter lymph nodes in the retroperitoneum. Reproductive: Prostate is unremarkable. Other: There is small umbilical herniation of mesenteric fat. Musculoskeletal: Degenerative joint changes of the spine are identified. There are small lucencies within the left sacrum unchanged compared to prior CT likely chronic change. No other focal discrete lytic or blastic lesion is identified. IMPRESSION: Circumferential thickening of the sigmoid colon with adjacent abnormal enlarged lymph nodes. The findings are suspicious for colonic neoplasm with adjacent lymph node extension. Nodular soft tissue in the gallbladder wall with gallstones. There is probable associated necrotic lymph node in the porta hepatis. 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 Findings are suspicious for neoplasm question of gallbladder origin. Electronically Signed   By: WAbelardo DieselM.D.   On: 03/22/2016 19:40   Dg Abd Acute W/chest  Result Date: 03/22/2016 CLINICAL DATA:  Episodes of nausea and vomiting over the last 2 weeks. EXAM: DG ABDOMEN ACUTE W/ 1V CHEST COMPARISON:  None. FINDINGS: Heart size is normal. Mediastinal shadows are normal. The lungs are clear. No effusions. No free air seen under the diaphragm. Abdominal bowel gas pattern is within normal limits. No abnormal calcifications or acute bone findings. IMPRESSION: Negative radiographs. Gas pattern within normal limits. No free air. Electronically Signed   By: MNelson ChimesM.D.   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 Findings suspicious for malignancy of gallbladder. Needs flexible sigmoidoscopy today for evaluation of colon, with addition of EGD to evaluate for any stigmata of portal hypertension. Concern for malignant thrombosis: recommend Hematology consultation for evaluation and discussion of anticoagulation, as  GI does not manage portal vein thrombosis.   Elevated transaminases: improved today significantly, unknown baseline, likely multifactorial. Continue to follow.   Anemia: in setting of likely malignancy. Heme negative. No overt GI bleeding. Anemia panel in process.   CEA and CA 19-9 pending.   Plan: Flex sig with EGD today by Dr. Oneida Alar. Discussed risks and benefits with patient, who states understanding and desires to proceed.  Repeat HFP, CBC in am Recommend Hematology referral for management and evaluation of portal vein thrombosis Last dose of Lovenox received this morning at 0122: hold on any further until after procedure this afternoon NPO, 2 tap water enemas this morning Antibiotics per pharmacy General Surgery on board   Annitta Needs, ANP-BC [facility] Gastroenterology     LOS: 1 day    03/23/2016, 10:03 AM

## 2016-03-23 NOTE — Op Note (Signed)
[facility] Patient Name: Brett Bright Procedure Date: 03/23/2016 2:27 PM MRN: [id number] Date of Birth: [birth year] Attending MD: Barney Drain , MD CSN: [id number] Age: 56 Admit Type: Inpatient Procedure:                Colonoscopy WITH COLD FORCEPS BIOPSY Indications:              Abnormal CT of the GI tract Providers:                Barney Drain, MD, Lurline Del, RN, Aram Candela Referring MD:             Chipper Herb Medicines:                Meperidine 75 mg IV + versed 6 mg iv given during                            EGD. No additonal meds for colonoscopy. Complications:            No immediate complications. Estimated Blood Loss:     Estimated blood loss was minimal. Procedure:                Pre-Anesthesia Assessment:                           - Prior to the procedure, a History and Physical                            was performed, and patient medications and                            allergies were reviewed. The patient's tolerance of                            previous anesthesia was also reviewed. The risks                            and benefits of the procedure and the sedation                            options and risks were discussed with the patient.                            All questions were answered, and informed consent                            was obtained. Prior Anticoagulants: The patient has                            taken Lovenox (enoxaparin), last dose was day of                            procedure. ASA Grade Assessment: II - A patient                            with mild systemic disease. After reviewing the  risks and benefits, the patient was deemed in                            satisfactory condition to undergo the procedure.                            After obtaining informed consent, the colonoscope                            was passed under direct vision. Throughout the                            procedure,  the patient's blood pressure, pulse, and                            oxygen saturations were monitored continuously. The                            EC-[id number] ([id number]) scope was introduced through                            the anus and advanced to the the cecum, identified                            by appendiceal orifice and ileocecal valve. After                            obtaining informed consent, the colonoscope was                            passed under direct vision. Throughout the                            procedure, the patient's blood pressure, pulse, and                            oxygen saturations were monitored continuously. The                            ileocecal valve, appendiceal orifice, and rectum                            were photographed. The colonoscopy was somewhat                            difficult due to a tortuous colon. Successful                            completion of the procedure was aided by COLOWRAP.                            The patient tolerated the procedure well. The  quality of the bowel preparation was adequate to                            identify polyps 6 mm and larger in size. Scope In: 2:31:06 PM Scope Out: 2:52:20 PM Scope Withdrawal Time: 0 hours 18 minutes 33 seconds  Total Procedure Duration: 0 hours 21 minutes 14 seconds  Findings:      The digital rectal exam findings include thrombosed external hemorrhoids.      Segmental moderate inflammation characterized by congestion (edema),       erythema, friability and pseudopolyps was found in the sigmoid       colon(25-35 CM FROM THE ANAL VERGE). Biopsies were taken with a cold       forceps for histology(#2).      Internal hemorrhoids were found during retroflexion. The hemorrhoids       were moderate.      Many small-mouthed diverticula were found in the sigmoid colon and       descending colon. Impression:               - Thrombosed external hemorrhoids  found on digital                            rectal exam.                           - Segmental moderate inflammation was found in the                            sigmoid colon secondary to colitis. Biopsied.                           - Internal hemorrhoids.                           - Diverticulosis in the sigmoid colon and in the                            descending colon ASSOCIATED WITH SEGMENTAL COLITIS. Moderate Sedation:      Moderate (conscious) sedation was administered by the endoscopy nurse       and supervised by the endoscopist. The following parameters were       monitored: oxygen saturation, heart rate, blood pressure, and response       to care. Total physician intraservice time was 45 minutes. Recommendation:           - Cardiac diet.                           - Continue present medications. CONTINUE ZOSYN FOR                            10 DAYS. START HEPARIN W/O BOLUS FOR PV THROMBOSIS.                            HEMATOLOGY CONSULT PENDING. MONITOR FOR BLEEDING.                           -  Await pathology results.                           - Repeat colonoscopy in 5 years for surveillance                            DUE TO PREP IN RIGHT COLON NOT ADEQUEST TO DETECT                            POLYPS < 5 MM.                           - Return patient to hospital ward for ongoing care. Procedure Code(s):        --- Professional ---                           [phone], Colonoscopy, flexible; with biopsy, single                            or multiple                           99152, Moderate sedation services provided by the                            same physician or other qualified health care                            professional performing the diagnostic or                            therapeutic service that the sedation supports,                            requiring the presence of an independent trained                            observer to assist in the monitoring of the                             patient's level of consciousness and physiological                            status; initial 15 minutes of intraservice time,                            patient age 33 years or older                           [phone], Moderate sedation services; each additional                            15 minutes intraservice time  16109, Moderate sedation services; each additional                            15 minutes intraservice time Diagnosis Code(s):        --- Professional ---                           K64.5, Perianal venous thrombosis                           K52.9, Noninfective gastroenteritis and colitis,                            unspecified                           K57.30, Diverticulosis of large intestine without                            perforation or abscess without bleeding                           R93.3, Abnormal findings on diagnostic imaging of                            other parts of digestive tract CPT copyright 2016 American [employer] Association. All rights reserved. The codes documented in this report are preliminary and upon coder review may  be revised to meet current compliance requirements. Barney Drain, MD Barney Drain, MD 03/23/2016 3:23:41 PM This report has been signed electronically. Number of Addenda: 0

## 2016-03-23 NOTE — Progress Notes (Signed)
ANTIBIOTIC CONSULT NOTE  Pharmacy Consult for zosyn Indication: intra-abdominal infection  No Known Allergies  Patient Measurements: Height: 5' 9"  (175.3 cm) Weight: 208 lb 6.4 oz (94.5 kg) IBW/kg (Calculated) : 70.7 Adjusted Body Weight:   Vital Signs: Temp: 97.6 F (36.4 C) (01/02 0603) Temp Source: Oral (01/02 0603) BP: 96/56 (01/02 0603) Pulse Rate: 93 (01/02 0603)  Labs:  Recent Labs  03/22/16 1640 03/23/16 0519  WBC 16.8* 11.2*  HGB 11.0* 9.3*  PLT 324 232  CREATININE 0.88 0.77   Estimated Creatinine Clearance: 119.7 mL/min (by C-G formula based on SCr of 0.77 mg/dL).  No results for input(s): VANCOTROUGH, VANCOPEAK, VANCORANDOM, GENTTROUGH, GENTPEAK, GENTRANDOM, TOBRATROUGH, TOBRAPEAK, TOBRARND, AMIKACINPEAK, AMIKACINTROU, AMIKACIN in the last 72 hours.   Microbiology: Recent Results (from the past 720 hour(s))  Blood Culture (routine x 2)     Status: None (Preliminary result)   Collection Time: 03/22/16  5:46 PM  Result Value Ref Range Status   Specimen Description BLOOD RIGHT ARM  Final   Special Requests BOTTLES DRAWN AEROBIC AND ANAEROBIC 6CC EACH  Final   Culture NO GROWTH < 24 HOURS  Final   Report Status PENDING  Incomplete  Blood Culture (routine x 2)     Status: None (Preliminary result)   Collection Time: 03/22/16  5:49 PM  Result Value Ref Range Status   Specimen Description BLOOD LEFT ARM  Final   Special Requests BOTTLES DRAWN AEROBIC AND ANAEROBIC 6CC EACH  Final   Culture NO GROWTH < 24 HOURS  Final   Report Status PENDING  Incomplete    Medical History: History reviewed. No pertinent past medical history.  Medications:  No prescriptions prior to admission.   Scheduled:  . enoxaparin (LOVENOX) injection  40 mg Subcutaneous Q24H  . piperacillin-tazobactam (ZOSYN)  IV  3.375 g Intravenous Q8H   Infusions:   PRN: acetaminophen **OR** acetaminophen, morphine injection, oxyCODONE Anti-infectives    Start     Dose/Rate Route Frequency  Ordered Stop   03/23/16 0200  piperacillin-tazobactam (ZOSYN) IVPB 3.375 g     3.375 g 12.5 mL/hr over 240 Minutes Intravenous Every 8 hours 03/22/16 2220     03/22/16 1745  piperacillin-tazobactam (ZOSYN) IVPB 3.375 g     3.375 g 100 mL/hr over 30 Minutes Intravenous  Once 03/22/16 1736 03/22/16 1835     Assessment: 56 yo male with vomiting, fever. 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 Starting zosyn.  Plan:  Zosyn 3.375gm IV q8h, EID Monitor labs, renal fxn, progress and c/s  Hart Robinsons A, RPH 03/23/2016,9:39 AM

## 2016-03-23 NOTE — Progress Notes (Signed)
ANTICOAGULATION CONSULT NOTE - Initial Consult  Pharmacy Consult for Heparin Indication: R PORTAL VEIN THROMBOSIS No Known Allergies  Patient Measurements:  Height: 5' 9"  (175.3 cm) Weight: 208 lb 6.4 oz (94.5 kg) IBW/kg (Calculated) : 70.7 Heparin Dosing Weight: 90.3 kg  Vital Signs: Temp: 98.5 F (36.9 C) (01/02 1520) Temp Source: Oral (01/02 1520) BP: 106/65 (01/02 1520) Pulse Rate: 100 (01/02 1520)  Labs:  Recent Labs  03/22/16 1640 03/22/16 2207 03/23/16 0519  HGB 11.0*  --  9.3*  HCT 33.3*  --  28.5*  PLT 324  --  232  APTT  --  37* 39*  LABPROT  --  17.4* 17.2*  INR  --  1.41 1.39  CREATININE 0.88  --  0.77    Estimated Creatinine Clearance: 119.7 mL/min (by C-G formula based on SCr of 0.77 mg/dL).   Medical History: Past Medical History:  Diagnosis Date  . Medical history non-contributory     Medications:  No prescriptions prior to admission.    Assessment: 56 yo male with diagnosis of colon mass,possible portal vein thrombosis, possible colon malignancy, evaluate upper GI tract. Pharmacy consult to start heparin for right portal vein thrombosis  Goal of Therapy:  Heparin level 0.3-0.7 units/ml Monitor platelets by anticoagulation protocol: Yes   Plan:  Give 4000 units bolus x 1 Start heparin infusion at 1450 units/hr Check anti-Xa level in 6-8 hours and daily while on heparin Continue to monitor H&H and platelets  Abner Greenspan, [place] Kinnear Bennett 03/23/2016,3:36 PM

## 2016-03-23 NOTE — H&P (View-Only) (Signed)
Referring Provider: Dr. Dena Billet  Primary Care Physician:  No PCP Per Patient Primary Gastroenterologist:  Dr. Oneida Alar   Date of Admission: 03/22/16 Date of Consultation: 03/23/16  Reason for Consultation:  Sigmoid colon mass, gallbladder mass   HPI:  Brett Bright is a 56 y.o. year old male presenting with several week history of fatigue, intermittent nausea and vomiting. Presented to the ED yesterday with CT findings of circumferential thickening of the sigmoid colon with adjacent abnormal lymph nodes, suspicious for colonic neoplasm. 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 Leukocytosis on admission at 16.8, improved today to 11.2, fever max 103.2 on admission now resolved. On empiric antibiotics with Zosyn. Tachycardia resolved, afebrile this morning. General Surgery consultation on file, with further plans after GI evaluation with colonoscopy.     Around Dec 16th started feeling bad. After a few days he improved, then several days later started vomiting on a Friday every 15 to 20 minutes. Went to Urgent Care and given nausea medication. No nausea since Sunday evening the 24th. Christmas Day had no energy, went back to work on the 26th. Symptoms of fatigue persisted and decided to come to the ED for evaluation yesterday. Felt fatigued. No abdominal pain. Decreased appetite since the 23rd. No dysphagia. No diarrhea till coming to the hospital and now has looser stool that is "brownish black". Had 2 overnight. Heme negative. Small amount of bright red blood several weeks ago in setting of what he thought was hemorrhoids. Thinks he has lost weight. Baseline weight usually around 210, today 208. No significant weight loss. Tries to eat healthy at home. Wife has lost 160 lbs on weight watchers, so he is trying to eat healthy as well. No prior colonoscopy.    Past Medical History:  Diagnosis Date  . Medical history non-contributory     Past Surgical History:  Procedure Laterality Date  . APPENDECTOMY  1975   in [place]  . VASECTOMY      Prior to Admission medications   Not on File    Current Facility-Administered Medications  Medication Dose Route Frequency Provider Last Rate Last Dose  . acetaminophen (TYLENOL) tablet 650 mg  650 mg Oral Q6H PRN Tacey Ruiz, MD   650 mg at 03/22/16 2219   Or  . acetaminophen (TYLENOL) suppository 650 mg  650 mg Rectal Q6H PRN Tacey Ruiz, MD      . enoxaparin (LOVENOX) injection 40 mg  40 mg Subcutaneous Q24H Tacey Ruiz, MD   40 mg at 03/23/16 0122  . morphine 2 MG/ML injection 2 mg  2 mg Intravenous Q2H PRN Tacey Ruiz, MD      . oxyCODONE (Oxy IR/ROXICODONE) immediate release tablet 5 mg  5 mg Oral Q4H PRN Tacey Ruiz, MD      . piperacillin-tazobactam (ZOSYN) IVPB 3.375 g  3.375 g Intravenous Q8H Tacey Ruiz, MD   3.375 g at 03/23/16 0122    Allergies as of 03/22/2016  . (No Known Allergies)    Family History  Problem Relation Age of Onset  . Stroke Mother   . Cancer Maternal Grandfather     Unknown type of cancer  . Heart attack Paternal Grandmother   . Cancer Maternal Aunt     Possibly lung cancer  . Cancer Paternal Uncle     Unknown type of cancer  . Cancer Paternal Uncle  Unknown type of cancer  . Colon cancer Neg Hx     Social History   Social History  . Marital status: Married    Spouse name: N/A  . Number of children: 3  . Years of education: N/A   Occupational History  . Parts Clerk     At [employer]    Social History Main Topics  . Smoking status: Never Smoker  . Smokeless tobacco: Never Used  . Alcohol use No  . Drug use: No  . Sexual activity: Not on file   Other Topics Concern  . 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   He is married and has  3 children, who are ages 41, 1, and 69 as of 2017.   The 42 and 14 year old children are still living at home, but the 55 yo is married and lives elsewhere.   The patient lives in [place], [place].      His primary care is [facility] in [place], [place].    Review of Systems: Gen: see HPI  CV: Denies chest pain, heart palpitations, syncope, edema  Resp: Denies shortness of breath with rest, cough, wheezing GI: see HPI  GU : Denies urinary burning, urinary frequency, urinary incontinence.  MS: Denies joint pain,swelling, cramping Derm: Denies rash, itching, dry skin Psych: Denies depression, anxiety,confusion, or memory loss Heme: see HPI   Physical Exam: Vital signs in last 24 hours: Temp:  [97.6 F (36.4 C)-103.2 F (39.6 C)] 97.6 F (36.4 C) (01/02 0603) Pulse Rate:  [93-129] 93 (01/02 0603) Resp:  [20-29] 20 (01/02 0603) BP: (96-140)/(55-65) 96/56 (01/02 0603) SpO2:  [93 %-99 %] 99 % (01/02 0603) Weight:  [203 lb (92.1 kg)-208 lb 6.4 oz (94.5 kg)] 208 lb 6.4 oz (94.5 kg) (01/01 2151) Last BM Date: 03/22/16 General:   Alert,  Well-developed, well-nourished, pleasant and cooperative in NAD Head:  Normocephalic and atraumatic. Eyes:  Sclera clear, no icterus.   Conjunctiva pink. Ears:  Normal auditory acuity. Nose:  No deformity, discharge,  or lesions. Mouth:  No deformity or lesions, dentition normal. Lungs:  Clear throughout to auscultation.   No wheezes, crackles, or rhonchi. No acute distress. Heart:  Regular rate and rhythm; no murmurs, clicks, rubs,  or gallops. Abdomen:  Soft, nontender and nondistended. No masses, hepatosplenomegaly or hernias noted. Normal bowel sounds, without guarding, and without rebound.   Rectal:  Deferred until time of colonoscopy.   Msk:  Symmetrical without gross deformities. Normal posture. Extremities:  Without  edema. Neurologic:  Alert and  oriented x4 Skin:  Intact without significant lesions or rashes. Psych:  Alert and  cooperative. Normal mood and affect.  Intake/Output from previous day: 01/01 0701 - 01/02 0700 In: 7290 [P.O.:240; IV Piggyback:4050] Out: -  Intake/Output this shift: No intake/output data recorded.  Lab Results:  Recent Labs  03/22/16 1640 03/23/16 0519  WBC 16.8* 11.2*  HGB 11.0* 9.3*  HCT 33.3* 28.5*  PLT 324 232   BMET  Recent Labs  03/22/16 1640 03/23/16 0519  NA 131* 134*  K 3.4* 4.0  CL 96* 108  CO2 24 22  GLUCOSE 105* 99  BUN 11 11  CREATININE 0.88 0.77  CALCIUM 7.9* 6.4*   LFT  Recent Labs  03/22/16 1640 03/23/16 0519  PROT 7.9 5.6*  ALBUMIN 2.4* 1.6*  AST 164* 89*  ALT 127* 84*  ALKPHOS  104 72  BILITOT 0.8 0.3   PT/INR  Recent Labs  03/22/16 2207 03/23/16 0519  LABPROT 17.4* 17.2*  INR 1.41 1.39   Lab Results  Component Value Date   LIPASE 60 (H) 03/22/2016    Studies/Results: Ct Abdomen Pelvis W Contrast  Result Date: 03/22/2016 CLINICAL DATA:  Fever, nausea for 3 weeks. EXAM: CT ABDOMEN AND PELVIS WITH CONTRAST TECHNIQUE: Multidetector CT imaging of the abdomen and pelvis was performed using the standard protocol following bolus administration of intravenous contrast. CONTRAST:  178m ISOVUE-300 IOPAMIDOL (ISOVUE-300) INJECTION 61% COMPARISON:  August 20, 2012 FINDINGS: Lower chest: No acute abnormality. Hepatobiliary: There are gallstones within the gallbladder. There are nodular soft tissue density along the gallbladder wall. 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 There is 1 cm low-density lesion in the right lobe liver. There is 2.5 cm probable necrotic lymph node in the porta hepatis. Pancreas: Unremarkable. No pancreatic ductal dilatation or surrounding inflammatory changes. Spleen: Normal in size without focal abnormality. Adrenals/Urinary Tract: The adrenal glands are normal. There are simple cysts in both kidneys, largest in the right kidney measuring 9.4 x 10.4 cm unchanged. There are hyperdense lesions in the lower  pole of the right kidney unchanged compared prior exam consistent with hemorrhagic cysts. There is no hydronephrosis bilaterally. The bladder is normal. Stomach/Bowel: There is a 7 cm segment of abnormal circumferential thickening of the sigmoid colon. There are adjacent abnormal enlarged pericolonic lymph node, the largest measures 1.3 cm. There is no small bowel obstruction. The patient status post prior appendectomy. Vascular/Lymphatic: The aorta is normal. There are small subcentimeter lymph nodes in the retroperitoneum. Reproductive: Prostate is unremarkable. Other: There is small umbilical herniation of mesenteric fat. Musculoskeletal: Degenerative joint changes of the spine are identified. There are small lucencies within the left sacrum unchanged compared to prior CT likely chronic change. No other focal discrete lytic or blastic lesion is identified. IMPRESSION: Circumferential thickening of the sigmoid colon with adjacent abnormal enlarged lymph nodes. The findings are suspicious for colonic neoplasm with adjacent lymph node extension. Nodular soft tissue in the gallbladder wall with gallstones. There is probable associated necrotic lymph node in the porta hepatis. 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 Findings are suspicious for neoplasm question of gallbladder origin. Electronically Signed   By: WAbelardo DieselM.D.   On: 03/22/2016 19:40   Dg Abd Acute W/chest  Result Date: 03/22/2016 CLINICAL DATA:  Episodes of nausea and vomiting over the last 2 weeks. EXAM: DG ABDOMEN ACUTE W/ 1V CHEST COMPARISON:  None. FINDINGS: Heart size is normal. Mediastinal shadows are normal. The lungs are clear. No effusions. No free air seen under the diaphragm. Abdominal bowel gas pattern is within normal limits. No abnormal calcifications or acute bone findings. IMPRESSION: Negative radiographs. Gas pattern within normal limits. No free air. Electronically Signed   By: MNelson ChimesM.D.   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 Findings suspicious for malignancy of gallbladder. Needs flexible sigmoidoscopy today for evaluation of colon, with addition of EGD to evaluate for any stigmata of portal hypertension. Concern for malignant thrombosis: recommend Hematology consultation for evaluation and discussion of anticoagulation, as  GI does not manage portal vein thrombosis.   Elevated transaminases: improved today significantly, unknown baseline, likely multifactorial. Continue to follow.   Anemia: in setting of likely malignancy. Heme negative. No overt GI bleeding. Anemia panel in process.   CEA and CA 19-9 pending.   Plan: Flex sig with EGD today by Dr. Oneida Alar. Discussed risks and benefits with patient, who states understanding and desires to proceed.  Repeat HFP, CBC in am Recommend Hematology referral for management and evaluation of portal vein thrombosis Last dose of Lovenox received this morning at 0122: hold on any further until after procedure this afternoon NPO, 2 tap water enemas this morning Antibiotics per pharmacy General Surgery on board   Annitta Needs, ANP-BC [facility]     LOS: 1 day    03/23/2016, 10:03 AM

## 2016-03-23 NOTE — Consult Note (Signed)
Reason for Consult: Nausea and generalized malaise Referring Physician: Dr. Enrigue Catena is an 56 y.o. male.  HPI: Patient is a 56 year old white male who was seen the emergency room yesterday evening with a greater than two-week history of worsening nausea and generalized malaise. He states he has not had real abdominal pain. He did have an episode of blood per rectum, thought to be secondary to hemorrhoidal disease. Patient had a CT scan of the abdomen which revealed a circumferential sigmoid colon lesion as well as cholelithiasis with a thickened gallbladder wall, both concerning for malignancy.  Patient has never had a colonoscopy. There is no family history of colon carcinoma. He denies any significant weight loss.   History reviewed. No pertinent past medical history.  Past Surgical History:  Procedure Laterality Date  . APPENDECTOMY  1975   in [place]    Family History  Problem Relation Age of Onset  . Stroke Mother   . Cancer Maternal Grandfather     Unknown type of cancer  . Heart attack Paternal Grandmother   . Cancer Maternal Aunt     Possibly lung cancer  . Cancer Paternal Uncle     Unknown type of cancer  . Cancer Paternal Uncle     Unknown type of cancer    Social History:  reports that he has never smoked. He has never used smokeless tobacco. He reports that he does not drink alcohol or use drugs.  Allergies: No Known Allergies  Medications:  Prior to Admission:  No prescriptions prior to admission.   Scheduled: . enoxaparin (LOVENOX) injection  40 mg Subcutaneous Q24H  . piperacillin-tazobactam (ZOSYN)  IV  3.375 g Intravenous Q8H    Results for orders placed or performed during the hospital encounter of 03/22/16 (from the past 48 hour(s))  CBC with Differential/Platelet     Status: Abnormal   Collection Time: 03/22/16  4:40 PM  Result Value Ref Range   WBC 16.8 (H) 4.0 - 10.5 K/uL   RBC 3.93 (L) 4.22 - 5.81 MIL/uL   Hemoglobin 11.0  (L) 13.0 - 17.0 g/dL   HCT 33.3 (L) 39.0 - 52.0 %   MCV 84.7 78.0 - 100.0 fL   MCH 28.0 26.0 - 34.0 pg   MCHC 33.0 30.0 - 36.0 g/dL   RDW 13.0 11.5 - 15.5 %   Platelets 324 150 - 400 K/uL   Neutrophils Relative % 90 %   Neutro Abs 15.1 (H) 1.7 - 7.7 K/uL   Lymphocytes Relative 5 %   Lymphs Abs 0.9 0.7 - 4.0 K/uL   Monocytes Relative 5 %   Monocytes Absolute 0.8 0.1 - 1.0 K/uL   Eosinophils Relative 0 %   Eosinophils Absolute 0.0 0.0 - 0.7 K/uL   Basophils Relative 0 %   Basophils Absolute 0.0 0.0 - 0.1 K/uL  Comprehensive metabolic panel     Status: Abnormal   Collection Time: 03/22/16  4:40 PM  Result Value Ref Range   Sodium 131 (L) 135 - 145 mmol/L   Potassium 3.4 (L) 3.5 - 5.1 mmol/L   Chloride 96 (L) 101 - 111 mmol/L   CO2 24 22 - 32 mmol/L   Glucose, Bld 105 (H) 65 - 99 mg/dL   BUN 11 6 - 20 mg/dL   Creatinine, Ser 0.88 0.61 - 1.24 mg/dL   Calcium 7.9 (L) 8.9 - 10.3 mg/dL   Total Protein 7.9 6.5 - 8.1 g/dL   Albumin 2.4 (L) 3.5 - 5.0 g/dL  AST 164 (H) 15 - 41 U/L   ALT 127 (H) 17 - 63 U/L   Alkaline Phosphatase 104 38 - 126 U/L   Total Bilirubin 0.8 0.3 - 1.2 mg/dL   GFR calc non Af Amer >60 >60 mL/min   GFR calc Af Amer >60 >60 mL/min    Comment: (NOTE) The eGFR has been calculated using the CKD EPI equation. This calculation has not been validated in all clinical situations. eGFR's persistently <60 mL/min signify possible Chronic Kidney Disease.    Anion gap 11 5 - 15  Lipase, blood     Status: Abnormal   Collection Time: 03/22/16  4:40 PM  Result Value Ref Range   Lipase 60 (H) 11 - 51 U/L  Blood Culture (routine x 2)     Status: None (Preliminary result)   Collection Time: 03/22/16  5:46 PM  Result Value Ref Range   Specimen Description BLOOD RIGHT ARM    Special Requests BOTTLES DRAWN AEROBIC AND ANAEROBIC 6CC EACH    Culture PENDING    Report Status PENDING   Lactic acid, plasma     Status: None   Collection Time: 03/22/16  5:48 PM  Result Value  Ref Range   Lactic Acid, Venous 0.9 0.5 - 1.9 mmol/L  Magnesium     Status: Abnormal   Collection Time: 03/22/16  5:48 PM  Result Value Ref Range   Magnesium 1.5 (L) 1.7 - 2.4 mg/dL  Reticulocytes     Status: Abnormal   Collection Time: 03/22/16  5:48 PM  Result Value Ref Range   Retic Ct Pct 0.9 0.4 - 3.1 %   RBC. 3.94 (L) 4.22 - 5.81 MIL/uL   Retic Count, Manual 35.5 19.0 - 186.0 K/uL  Blood Culture (routine x 2)     Status: None (Preliminary result)   Collection Time: 03/22/16  5:49 PM  Result Value Ref Range   Specimen Description BLOOD LEFT ARM    Special Requests BOTTLES DRAWN AEROBIC AND ANAEROBIC 6CC EACH    Culture PENDING    Report Status PENDING   I-Stat CG4 Lactic Acid, ED  (not at  [facility])     Status: None   Collection Time: 03/22/16  6:04 PM  Result Value Ref Range   Lactic Acid, Venous 0.90 0.5 - 1.9 mmol/L  Urinalysis, Routine w reflex microscopic     Status: Abnormal   Collection Time: 03/22/16  7:59 PM  Result Value Ref Range   Color, Urine YELLOW YELLOW   APPearance CLEAR CLEAR   Specific Gravity, Urine 1.026 1.005 - 1.030   pH 7.0 5.0 - 8.0   Glucose, UA NEGATIVE NEGATIVE mg/dL   Hgb urine dipstick SMALL (A) NEGATIVE   Bilirubin Urine NEGATIVE NEGATIVE   Ketones, ur NEGATIVE NEGATIVE mg/dL   Protein, ur NEGATIVE NEGATIVE mg/dL   Nitrite NEGATIVE NEGATIVE   Leukocytes, UA NEGATIVE NEGATIVE   RBC / HPF 0-5 0 - 5 RBC/hpf   WBC, UA 0-5 0 - 5 WBC/hpf   Bacteria, UA NONE SEEN NONE SEEN  Influenza panel by PCR (type A & B, H1N1)     Status: None   Collection Time: 03/22/16  9:03 PM  Result Value Ref Range   Influenza A By PCR NEGATIVE NEGATIVE   Influenza B By PCR NEGATIVE NEGATIVE    Comment: (NOTE) The Xpert Xpress Flu assay is intended as an aid in the diagnosis of  influenza and should not be used as a sole basis for treatment.  This  assay is FDA approved for nasopharyngeal swab specimens only. Nasal  washings and aspirates are unacceptable for Xpert  Xpress Flu testing.   Procalcitonin     Status: None   Collection Time: 03/22/16 10:07 PM  Result Value Ref Range   Procalcitonin 4.63 ng/mL    Comment:        Interpretation: PCT > 2 ng/mL: Systemic infection (sepsis) is likely, unless other causes are known. (NOTE)         ICU PCT Algorithm               Non ICU PCT Algorithm    ----------------------------     ------------------------------         PCT < 0.25 ng/mL                 PCT < 0.1 ng/mL     Stopping of antibiotics            Stopping of antibiotics       strongly encouraged.               strongly encouraged.    ----------------------------     ------------------------------       PCT level decrease by               PCT < 0.25 ng/mL       >= 80% from peak PCT       OR PCT 0.25 - 0.5 ng/mL          Stopping of antibiotics                                             encouraged.     Stopping of antibiotics           encouraged.    ----------------------------     ------------------------------       PCT level decrease by              PCT >= 0.25 ng/mL       < 80% from peak PCT        AND PCT >= 0.5 ng/mL            Continuing antibiotics                                               encouraged.       Continuing antibiotics            encouraged.    ----------------------------     ------------------------------     PCT level increase compared          PCT > 0.5 ng/mL         with peak PCT AND          PCT >= 0.5 ng/mL             Escalation of antibiotics                                          strongly encouraged.      Escalation of antibiotics        strongly encouraged.   Protime-INR  Status: Abnormal   Collection Time: 03/22/16 10:07 PM  Result Value Ref Range   Prothrombin Time 17.4 (H) 11.4 - 15.2 seconds   INR 1.41   APTT     Status: Abnormal   Collection Time: 03/22/16 10:07 PM  Result Value Ref Range   aPTT 37 (H) 24 - 36 seconds    Comment:        IF BASELINE aPTT IS ELEVATED, SUGGEST PATIENT  RISK ASSESSMENT BE USED TO DETERMINE APPROPRIATE ANTICOAGULANT THERAPY.   Type and screen [facility]     Status: None   Collection Time: 03/22/16 10:07 PM  Result Value Ref Range   ABO/RH(D) B POS    Antibody Screen NEG    Sample Expiration 03/25/2016   Lactic acid, plasma     Status: None   Collection Time: 03/22/16 10:07 PM  Result Value Ref Range   Lactic Acid, Venous 1.2 0.5 - 1.9 mmol/L  Lactic acid, plasma     Status: None   Collection Time: 03/23/16 12:55 AM  Result Value Ref Range   Lactic Acid, Venous 1.0 0.5 - 1.9 mmol/L  Comprehensive metabolic panel     Status: Abnormal   Collection Time: 03/23/16  5:19 AM  Result Value Ref Range   Sodium 134 (L) 135 - 145 mmol/L   Potassium 4.0 3.5 - 5.1 mmol/L   Chloride 108 101 - 111 mmol/L   CO2 22 22 - 32 mmol/L   Glucose, Bld 99 65 - 99 mg/dL   BUN 11 6 - 20 mg/dL   Creatinine, Ser 0.77 0.61 - 1.24 mg/dL   Calcium 6.4 (LL) 8.9 - 10.3 mg/dL    Comment: CRITICAL RESULT CALLED TO, READ BACK BY AND VERIFIED WITH: DANIEL,C AT 6:35AM ON 03/23/16 BY FESTERMAN,C    Total Protein 5.6 (L) 6.5 - 8.1 g/dL   Albumin 1.6 (L) 3.5 - 5.0 g/dL   AST 89 (H) 15 - 41 U/L   ALT 84 (H) 17 - 63 U/L   Alkaline Phosphatase 72 38 - 126 U/L   Total Bilirubin 0.3 0.3 - 1.2 mg/dL   GFR calc non Af Amer >60 >60 mL/min   GFR calc Af Amer >60 >60 mL/min    Comment: (NOTE) The eGFR has been calculated using the CKD EPI equation. This calculation has not been validated in all clinical situations. eGFR's persistently <60 mL/min signify possible Chronic Kidney Disease.    Anion gap 4 (L) 5 - 15  CBC     Status: Abnormal   Collection Time: 03/23/16  5:19 AM  Result Value Ref Range   WBC 11.2 (H) 4.0 - 10.5 K/uL   RBC 3.30 (L) 4.22 - 5.81 MIL/uL   Hemoglobin 9.3 (L) 13.0 - 17.0 g/dL   HCT 28.5 (L) 39.0 - 52.0 %   MCV 86.4 78.0 - 100.0 fL   MCH 28.2 26.0 - 34.0 pg   MCHC 32.6 30.0 - 36.0 g/dL   RDW 13.3 11.5 - 15.5 %   Platelets 232 150 -  400 K/uL  Protime-INR     Status: Abnormal   Collection Time: 03/23/16  5:19 AM  Result Value Ref Range   Prothrombin Time 17.2 (H) 11.4 - 15.2 seconds   INR 1.39   APTT     Status: Abnormal   Collection Time: 03/23/16  5:19 AM  Result Value Ref Range   aPTT 39 (H) 24 - 36 seconds    Comment:  IF BASELINE aPTT IS ELEVATED, SUGGEST PATIENT RISK ASSESSMENT BE USED TO DETERMINE APPROPRIATE ANTICOAGULANT THERAPY.   Magnesium     Status: None   Collection Time: 03/23/16  5:19 AM  Result Value Ref Range   Magnesium 1.9 1.7 - 2.4 mg/dL  Occult blood card to lab, stool RN will collect     Status: None   Collection Time: 03/23/16  6:50 AM  Result Value Ref Range   Fecal Occult Bld NEGATIVE NEGATIVE    Ct Abdomen Pelvis W Contrast  Result Date: 03/22/2016 CLINICAL DATA:  Fever, nausea for 3 weeks. EXAM: CT ABDOMEN AND PELVIS WITH CONTRAST TECHNIQUE: Multidetector CT imaging of the abdomen and pelvis was performed using the standard protocol following bolus administration of intravenous contrast. CONTRAST:  146m ISOVUE-300 IOPAMIDOL (ISOVUE-300) INJECTION 61% COMPARISON:  August 20, 2012 FINDINGS: Lower chest: No acute abnormality. Hepatobiliary: There are gallstones within the gallbladder. There are nodular soft tissue density along the gallbladder wall. 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 There is 1 cm low-density lesion in the right lobe liver. There is 2.5 cm probable necrotic lymph node in the porta hepatis. Pancreas: Unremarkable. No pancreatic ductal dilatation or surrounding inflammatory changes. Spleen: Normal in size without focal abnormality. Adrenals/Urinary Tract: The adrenal glands are normal. There are simple cysts in both kidneys, largest in the right kidney measuring 9.4 x 10.4 cm unchanged. There are hyperdense lesions in the lower pole of the right kidney unchanged compared prior exam consistent with hemorrhagic cysts. There is no hydronephrosis bilaterally.  The bladder is normal. Stomach/Bowel: There is a 7 cm segment of abnormal circumferential thickening of the sigmoid colon. There are adjacent abnormal enlarged pericolonic lymph node, the largest measures 1.3 cm. There is no small bowel obstruction. The patient status post prior appendectomy. Vascular/Lymphatic: The aorta is normal. There are small subcentimeter lymph nodes in the retroperitoneum. Reproductive: Prostate is unremarkable. Other: There is small umbilical herniation of mesenteric fat. Musculoskeletal: Degenerative joint changes of the spine are identified. There are small lucencies within the left sacrum unchanged compared to prior CT likely chronic change. No other focal discrete lytic or blastic lesion is identified. IMPRESSION: Circumferential thickening of the sigmoid colon with adjacent abnormal enlarged lymph nodes. The findings are suspicious for colonic neoplasm with adjacent lymph node extension. Nodular soft tissue in the gallbladder wall with gallstones. There is probable associated necrotic lymph node in the porta hepatis. 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 Findings are suspicious for neoplasm question of gallbladder origin. Electronically Signed   By: WAbelardo DieselM.D.   On: 03/22/2016 19:40   Dg Abd Acute W/chest  Result Date: 03/22/2016 CLINICAL DATA:  Episodes of nausea and vomiting over the last 2 weeks. EXAM: DG ABDOMEN ACUTE W/ 1V CHEST COMPARISON:  None. FINDINGS: Heart size is normal. Mediastinal shadows are normal. The lungs are clear. No effusions. No free air seen under the diaphragm. Abdominal bowel gas pattern is within normal limits. No abnormal calcifications or acute bone findings. IMPRESSION: Negative radiographs. Gas pattern within normal limits. No free air. Electronically Signed   By: MNelson ChimesM.D.   On: 03/22/2016 17:17    ROS:  Pertinent items noted in HPI and remainder of comprehensive ROS otherwise negative.  Blood pressure (!) 96/56,  pulse 93, temperature 97.6 F (36.4 C), temperature source Oral, resp. rate 20, height 5' 9"  (1.753 m), weight 94.5 kg (208 lb 6.4 oz), SpO2 99 %. Physical Exam: Well-developed well-nourished white  male no acute distress. Head is normocephalic, atraumatic. Neck is supple without lymphadenopathy or JVD. Lungs clear to auscultation with equal breath sounds bilaterally. Heart examination reveals a regular rate and rhythm without S3, S4, murmurs. Abdomen soft, nontender, nondistended. No hepatosplenomegaly, masses, hernias. Rectal exam was deferred at this time. CT scan personally reviewed by myself.  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 Obviously, he will need a colonoscopy. Further management is pending those results. A CA 19-9 and CEA level are pending. Will follow with you.  Quavion Boule A 03/23/2016, 7:58 AM

## 2016-03-23 NOTE — Interval H&P Note (Signed)
History and Physical Interval Note:  03/23/2016 1:57 PM  Brett Bright  has presented today for surgery, with the diagnosis of colon mass,possible portal vein thrombosis, possible colon malignancy, evaluate upper GI tract  The various methods of treatment have been discussed with the patient and family. After consideration of risks, benefits and other options for treatment, the patient has consented to  Procedure(s): FLEXIBLE SIGMOIDOSCOPY (N/A) ESOPHAGOGASTRODUODENOSCOPY (EGD) (N/A) as a surgical intervention .  The patient's history has been reviewed, patient examined, no change in status, stable for surgery.  I have reviewed the patient's chart and labs.  Questions were answered to the patient's satisfaction.     [employer]

## 2016-03-24 ENCOUNTER — Encounter (HOSPITAL_COMMUNITY): Payer: Self-pay | Admitting: Gastroenterology

## 2016-03-24 DIAGNOSIS — K5190 Ulcerative colitis, unspecified, without complications: Secondary | ICD-10-CM

## 2016-03-24 DIAGNOSIS — I81 Portal vein thrombosis: Secondary | ICD-10-CM

## 2016-03-24 DIAGNOSIS — D376 Neoplasm of uncertain behavior of liver, gallbladder and bile ducts: Secondary | ICD-10-CM

## 2016-03-24 DIAGNOSIS — R7989 Other specified abnormal findings of blood chemistry: Secondary | ICD-10-CM

## 2016-03-24 DIAGNOSIS — K529 Noninfective gastroenteritis and colitis, unspecified: Secondary | ICD-10-CM

## 2016-03-24 LAB — FOLATE RBC
FOLATE, HEMOLYSATE: 383.3 ng/mL
Folate, RBC: 1486 ng/mL (ref >498)
HEMATOCRIT: 25.8 % — AB (ref 37.5–51.0)

## 2016-03-24 LAB — HEPARIN LEVEL (UNFRACTIONATED)

## 2016-03-24 LAB — CBC
HCT: 27 % — ABNORMAL LOW (ref 39.0–52.0)
Hemoglobin: 8.8 g/dL — ABNORMAL LOW (ref 13.0–17.0)
MCH: 28 pg (ref 26.0–34.0)
MCHC: 32.6 g/dL (ref 30.0–36.0)
MCV: 86 fL (ref 78.0–100.0)
PLATELETS: 227 10*3/uL (ref 150–400)
RBC: 3.14 MIL/uL — AB (ref 4.22–5.81)
RDW: 13.3 % (ref 11.5–15.5)
WBC: 7.5 10*3/uL (ref 4.0–10.5)

## 2016-03-24 LAB — URINE CULTURE: CULTURE: NO GROWTH

## 2016-03-24 LAB — COMPREHENSIVE METABOLIC PANEL
ALT: 67 U/L — ABNORMAL HIGH (ref 17–63)
AST: 59 U/L — AB (ref 15–41)
Albumin: 1.6 g/dL — ABNORMAL LOW (ref 3.5–5.0)
Alkaline Phosphatase: 69 U/L (ref 38–126)
Anion gap: 5 (ref 5–15)
BUN: 9 mg/dL (ref 6–20)
CHLORIDE: 103 mmol/L (ref 101–111)
CO2: 24 mmol/L (ref 22–32)
Calcium: 6.9 mg/dL — ABNORMAL LOW (ref 8.9–10.3)
Creatinine, Ser: 0.66 mg/dL (ref 0.61–1.24)
GFR calc Af Amer: >60 mL/min (ref >60)
Glucose, Bld: 99 mg/dL (ref 65–99)
POTASSIUM: 3.3 mmol/L — AB (ref 3.5–5.1)
SODIUM: 132 mmol/L — AB (ref 135–145)
Total Bilirubin: 0.3 mg/dL (ref 0.3–1.2)
Total Protein: 5.6 g/dL — ABNORMAL LOW (ref 6.5–8.1)

## 2016-03-24 LAB — CANCER ANTIGEN 19-9: CA 19-9: 15 U/mL (ref 0–35)

## 2016-03-24 LAB — CEA: CEA: 3.9 ng/mL (ref 0.0–4.7)

## 2016-03-24 MED ORDER — ACETAMINOPHEN 325 MG PO TABS: 650.0000 mg | ORAL_TABLET | Freq: Four times a day (QID) | ORAL | Status: DC | PRN

## 2016-03-24 MED ORDER — PIPERACILLIN-TAZOBACTAM 3.375 G IVPB: 3.3750 g | Freq: Three times a day (TID) | INTRAVENOUS | Status: DC

## 2016-03-24 MED ORDER — OXYCODONE HCL 5 MG PO TABS: 5.0000 mg | ORAL_TABLET | ORAL | 0 refills | Status: DC | PRN

## 2016-03-24 MED ORDER — PANTOPRAZOLE SODIUM 40 MG PO TBEC: 40.0000 mg | DELAYED_RELEASE_TABLET | Freq: Every day | ORAL | Status: DC

## 2016-03-24 MED ORDER — HEPARIN BOLUS VIA INFUSION
2500.0000 [IU] | Freq: Once | INTRAVENOUS | Status: AC
  Administered 2016-03-24: 2500 [IU] via INTRAVENOUS
  Filled 2016-03-24: qty 2500

## 2016-03-24 MED ORDER — ACETAMINOPHEN 500 MG PO TABS
500.0000 mg | ORAL_TABLET | Freq: Once | ORAL | Status: AC
  Administered 2016-03-24: 500 mg via ORAL
  Filled 2016-03-24: qty 1

## 2016-03-24 MED ORDER — MORPHINE SULFATE (PF) 2 MG/ML IV SOLN: 2.0000 mg | INTRAVENOUS | 0 refills | Status: DC | PRN

## 2016-03-24 MED ORDER — HEPARIN (PORCINE) IN NACL 100-0.45 UNIT/ML-% IJ SOLN: 1800.0000 [IU]/h | INTRAMUSCULAR | Status: DC

## 2016-03-24 MED ORDER — POTASSIUM CHLORIDE CRYS ER 20 MEQ PO TBCR
40.0000 meq | EXTENDED_RELEASE_TABLET | Freq: Once | ORAL | Status: AC
  Administered 2016-03-24: 40 meq via ORAL
  Filled 2016-03-24: qty 2

## 2016-03-24 NOTE — Progress Notes (Signed)
Subjective:  Patient feels ok. No abdominal pain. Loose stool since colonoscopy.   Objective: Vital signs in last 24 hours: Temp:  [97.7 F (36.5 C)-101.8 F (38.8 C)] 97.7 F (36.5 C) (01/03 0541) Pulse Rate:  [78-117] 78 (01/03 0541) Resp:  [17-26] 20 (01/03 0541) BP: (72-107)/(33-66) 100/61 (01/03 0541) SpO2:  [90 %-100 %] 97 % (01/03 0541) Weight:  [210 lb 5.1 oz (95.4 kg)] 210 lb 5.1 oz (95.4 kg) (01/03 0541) Last BM Date: 03/23/16 General:   Alert,  Well-developed, well-nourished, pleasant and cooperative in NAD Head:  Normocephalic and atraumatic. Eyes:  Sclera clear, no icterus.   Abdomen:  Soft, nontender and nondistended.  Normal bowel sounds, without guarding, and without rebound.   Extremities:  Without clubbing, deformity or edema. Neurologic:  Alert and  oriented x4;  grossly normal neurologically. Skin:  Intact without significant lesions or rashes. Psych:  Alert and cooperative. Normal mood and affect.  Intake/Output from previous day: 01/02 0701 - 01/03 0700 In: 1249.1 [P.O.:480; I.V.:619.1; IV Piggyback:150] Out: 200 [Urine:200] Intake/Output this shift: No intake/output data recorded.  Lab Results: CBC  Recent Labs  03/22/16 1640 03/23/16 0519 03/24/16 0424  WBC 16.8* 11.2* 7.5  HGB 11.0* 9.3* 8.8*  HCT 33.3* 28.5* 27.0*  MCV 84.7 86.4 86.0  PLT 324 232 227   BMET  Recent Labs  03/22/16 1640 03/23/16 0519 03/24/16 0424  NA 131* 134* 132*  K 3.4* 4.0 3.3*  CL 96* 108 103  CO2 24 22 24   GLUCOSE 105* 99 99  BUN 11 11 9   CREATININE 0.88 0.77 0.66  CALCIUM 7.9* 6.4* 6.9*   LFTs  Recent Labs  03/22/16 1640 03/23/16 0519 03/24/16 0424  BILITOT 0.8 0.3 0.3  ALKPHOS 104 72 69  AST 164* 89* 59*  ALT 127* 84* 67*  PROT 7.9 5.6* 5.6*  ALBUMIN 2.4* 1.6* 1.6*    Recent Labs  03/22/16 1640  LIPASE 60*   PT/INR  Recent Labs  03/22/16 2207 03/23/16 0519  LABPROT 17.4* 17.2*  INR 1.41 1.39      Imaging Studies: Ct Abdomen  Pelvis W Contrast  Result Date: 03/22/2016 CLINICAL DATA:  Fever, nausea for 3 weeks. EXAM: CT ABDOMEN AND PELVIS WITH CONTRAST TECHNIQUE: Multidetector CT imaging of the abdomen and pelvis was performed using the standard protocol following bolus administration of intravenous contrast. CONTRAST:  124m ISOVUE-300 IOPAMIDOL (ISOVUE-300) INJECTION 61% COMPARISON:  August 20, 2012 FINDINGS: Lower chest: No acute abnormality. Hepatobiliary: There are gallstones within the gallbladder. There are nodular soft tissue density along the gallbladder wall. 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 There is 1 cm low-density lesion in the right lobe liver. There is 2.5 cm probable necrotic lymph node in the porta hepatis. Pancreas: Unremarkable. No pancreatic ductal dilatation or surrounding inflammatory changes. Spleen: Normal in size without focal abnormality. Adrenals/Urinary Tract: The adrenal glands are normal. There are simple cysts in both kidneys, largest in the right kidney measuring 9.4 x 10.4 cm unchanged. There are hyperdense lesions in the lower pole of the right kidney unchanged compared prior exam consistent with hemorrhagic cysts. There is no hydronephrosis bilaterally. The bladder is normal. Stomach/Bowel: There is a 7 cm segment of abnormal circumferential thickening of the sigmoid colon. There are adjacent abnormal enlarged pericolonic lymph node, the largest measures 1.3 cm. There is no small bowel obstruction. The patient status post prior appendectomy. Vascular/Lymphatic: The aorta is normal. There are small subcentimeter lymph nodes in the retroperitoneum. Reproductive: Prostate is  unremarkable. Other: There is small umbilical herniation of mesenteric fat. Musculoskeletal: Degenerative joint changes of the spine are identified. There are small lucencies within the left sacrum unchanged compared to prior CT likely chronic change. No other focal discrete lytic or blastic lesion is identified.  IMPRESSION: Circumferential thickening of the sigmoid colon with adjacent abnormal enlarged lymph nodes. The findings are suspicious for colonic neoplasm with adjacent lymph node extension. Nodular soft tissue in the gallbladder wall with gallstones. There is probable associated necrotic lymph node in the porta hepatis. 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 Findings are suspicious for neoplasm question of gallbladder origin. Electronically Signed   By: Abelardo Diesel M.D.   On: 03/22/2016 19:40   Dg Abd Acute W/chest  Result Date: 03/22/2016 CLINICAL DATA:  Episodes of nausea and vomiting over the last 2 weeks. EXAM: DG ABDOMEN ACUTE W/ 1V CHEST COMPARISON:  None. FINDINGS: Heart size is normal. Mediastinal shadows are normal. The lungs are clear. No effusions. No free air seen under the diaphragm. Abdominal bowel gas pattern is within normal limits. No abnormal calcifications or acute bone findings. IMPRESSION: Negative radiographs. Gas pattern within normal limits. No free air. Electronically Signed   By: Nelson Chimes M.D.   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 Findings suspicious for malignancy of gallbladder.   Limited prep colonoscopy yesterday with segmental moderate inflammation in sigmoid colon secondary to colitis, s/p bx. Diverticulosis in the sigmoid colon and in descending colon associated with segmental colitis.   EGD with gastric erythema and edema s/p bx, multiple gastric polyps, mild duodenitis.    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 Started on heparin post procedures.  Hematology consultation pending.    Elevated transaminases: improved today significantly, unknown baseline, likely multifactorial. Continue to follow.   Anemia: in setting of likely malignancy. Heme negative. No overt GI bleeding. Ferritin elevated due to APR. Iron, TIBC, sat low c/w anemia of chronic disease.    CEA and CA 19-9 negative.    Plan: 1. F/u pending path. 2. Continue Zosyn.  3. Repeat colonoscopy in 5 years for surveillance due to prep in right colon not adequate to detect polyp less than 26m.  4. Await surgery input.   LLaureen Ochs LBernarda CaffeyRCardinal Hill Rehabilitation HospitalGastroenterology Associates [phone]/[id number] AM     LOS: 2 days

## 2016-03-24 NOTE — Progress Notes (Signed)
ANTICOAGULATION CONSULT NOTE -   Pharmacy Consult for Heparin Indication: R PORTAL VEIN THROMBOSIS No Known Allergies  Patient Measurements:  Height: 5' 9"  (175.3 cm) Weight: 210 lb 5.1 oz (95.4 kg) IBW/kg (Calculated) : 70.7 Heparin Dosing Weight: 90.3 kg  Vital Signs: Temp: 97.7 F (36.5 C) (01/03 0541) Temp Source: Oral (01/03 0541) BP: 100/61 (01/03 0541) Pulse Rate: 78 (01/03 0541)  Labs:  Recent Labs  03/22/16 1640 03/22/16 2207 03/23/16 0519 03/23/16 2236 03/24/16 0424  HGB 11.0*  --  9.3*  --  8.8*  HCT 33.3*  --  28.5*  --  27.0*  PLT 324  --  232  --  227  APTT  --  37* 39*  --   --   LABPROT  --  17.4* 17.2*  --   --   INR  --  1.41 1.39  --   --   HEPARINUNFRC  --   --   --  <0.10* <0.10*  CREATININE 0.88  --  0.77  --  0.66    Estimated Creatinine Clearance: 120.3 mL/min (by C-G formula based on SCr of 0.66 mg/dL).   Medical History: Past Medical History:  Diagnosis Date  . Medical history non-contributory     Medications:  No prescriptions prior to admission.    Assessment: 56 yo male with diagnosis of colon mass,possible portal vein thrombosis, possible colon malignancy, evaluate upper GI tract. Pharmacy consult to start heparin for right portal vein thrombosis  Goal of Therapy:  Heparin level 0.3-0.7 units/ml Monitor platelets by anticoagulation protocol: Yes   Plan:  Give 2500 units bolus x 1 Increase heparin infusion to 1800 units/hr Check anti-Xa level in 6-8 hours and daily while on heparin Continue to monitor H&H and platelets  Isac Sarna, BS Vena Austria, BCPS Clinical Pharmacist Pager [phone] 03/24/2016,8:01 AM

## 2016-03-24 NOTE — Progress Notes (Signed)
ANTICOAGULATION CONSULT NOTE -   Pharmacy Consult for Heparin Indication: R PORTAL VEIN THROMBOSIS No Known Allergies  Patient Measurements:  Height: 5' 9"  (175.3 cm) Weight: 210 lb 5.1 oz (95.4 kg) IBW/kg (Calculated) : 70.7 Heparin Dosing Weight: 90.3 kg  Vital Signs: Temp: 99.1 F (37.3 C) (01/03 1242) Temp Source: Oral (01/03 1242) BP: 97/56 (01/03 1242) Pulse Rate: 98 (01/03 1242)  Labs:  Recent Labs  03/22/16 1640 03/22/16 2207 03/23/16 0519 03/23/16 2236 03/24/16 0424 03/24/16 1410  HGB 11.0*  --  9.3*  --  8.8*  --   HCT 33.3* 25.8* 28.5*  --  27.0*  --   PLT 324  --  232  --  227  --   APTT  --  37* 39*  --   --   --   LABPROT  --  17.4* 17.2*  --   --   --   INR  --  1.41 1.39  --   --   --   HEPARINUNFRC  --   --   --  <0.10* <0.10* <0.10*  CREATININE 0.88  --  0.77  --  0.66  --     Estimated Creatinine Clearance: 120.3 mL/min (by C-G formula based on SCr of 0.66 mg/dL).   Medical History: Past Medical History:  Diagnosis Date  . Medical history non-contributory     Medications:  No prescriptions prior to admission.    Assessment: 56 yo male with diagnosis of colon mass,possible portal vein thrombosis, possible colon malignancy, evaluate upper GI tract. Pharmacy consult to start heparin for right portal vein thrombosis  Goal of Therapy:  Heparin level 0.3-0.7 units/ml Monitor platelets by anticoagulation protocol: Yes   Plan:  Give 2500 units bolus x 1 Increase heparin infusion to 2150 units/hr Check anti-Xa level in 6-8 hours and daily while on heparin Continue to monitor H&H and platelets  Isac Sarna, BS Vena Austria, BCPS Clinical Pharmacist Pager [phone] 03/24/2016,3:29 PM

## 2016-03-24 NOTE — Progress Notes (Signed)
Colonoscopy op note reviewed. CA 19-9, CEA levels within normal limits. Given the CT findings of possible gallbladder malignancy, patient will be transferred to general surgery/surgical oncology at [facility]. They have accepted the patient on transfer.  Discussed with both patient and wife.

## 2016-03-24 NOTE — Discharge Summary (Signed)
Physician Discharge Summary  Brett Bright:[id number] DOB: [birth year] DOA: 03/22/2016  PCP: No PCP Per Patient  Admit date: 03/22/2016 Discharge date: 03/24/2016  Admitted From:[facility].  Disposition: [facility]  Recommendations for Outpatient Follow-up:  1. Follow up with PCP in 1-2 weeks 2. Please obtain BMP/CBC in one week 3. Please follow up with onc/surgery services at [facility].   Discharge Condition:stable.  CODE STATUS: full code.  Diet recommendation: Heart Healthy   Brief/Interim Summary: 56 year old man admitted from home on 1/1 due to complaints of fatigue, fever, nausea and vomiting. On CT scan found to have circumferential thickening of the sigmoid colon with adjacent abnormal enlarged lymph nodes concerning for colonic neoplasm as well as a nodular soft tissue in the gallbladder wall with gallstones (suspicious for gallbladder malignancy) and thrombosis of the hepatic portal vein.  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 -Plan to continue Zosyn for now. -Sepsis parameters continue to improve.  Concern for neoplasm of colon and gallbladder -GI and surgery have been consulted. -Patient underwent colonoscopy showing thrombosed external hemorrhoids , segmental  Mod inflammation of the sigmoid colon, colitis, and diverticulosis of the sigmoid colon. - - plan for transfer to [facility] for oncology and surgery services for the GB cancer.   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   Hyponatremia -Improving with IV fluids, likely due  to GI losses and dehydration with prolonged emesis.  Hypokalemia -Replaced, magnesium within normal limits at 1.9.   Discharge Instructions  Discharge Instructions    Discharge instructions    Complete by:  As directed    Follow up with surgery and oncology as recommended.     Allergies as of 03/24/2016   No Known Allergies     Medication List    TAKE these medications   acetaminophen 325 MG tablet Commonly known as:  TYLENOL Take 2 tablets (650 mg total) by mouth every 6 (six) hours as needed for mild pain (or Fever >/= 101).   heparin 100-0.45 UNIT/ML-% infusion Inject 1,800 Units/hr into the vein continuous.   morphine 2 MG/ML injection Inject 1 mL (2 mg total) into the vein every 2 (two) hours as needed.   oxyCODONE 5 MG immediate release tablet Commonly known as:  Oxy IR/ROXICODONE Take 1 tablet (5 mg total) by mouth every 4 (four) hours as needed for moderate pain.   pantoprazole 40 MG tablet Commonly known as:  PROTONIX Take 1 tablet (40 mg total) by mouth daily before breakfast.   piperacillin-tazobactam 3.375 GM/50ML IVPB Commonly known as:  ZOSYN Inject 50 mLs (3.375 g total) into the vein every 8 (eight) hours.       No Known Allergies  Consultations:  Surgery  GI   Procedures/Studies: Ct Abdomen Pelvis W Contrast  Result Date: 03/22/2016 CLINICAL DATA:  Fever, nausea for 3 weeks. EXAM: CT ABDOMEN AND PELVIS WITH CONTRAST TECHNIQUE: Multidetector CT imaging of the abdomen and pelvis was performed using the standard protocol following bolus administration of intravenous contrast. CONTRAST:  162m ISOVUE-300 IOPAMIDOL (ISOVUE-300) INJECTION 61% COMPARISON:  August 20, 2012 FINDINGS: Lower chest: No acute abnormality. Hepatobiliary: There are gallstones within the gallbladder. There are nodular soft tissue density along the gallbladder wall. 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 There is 1 cm low-density lesion in the right lobe liver.  There is 2.5 cm probable necrotic lymph node in the porta hepatis. Pancreas: Unremarkable. No pancreatic ductal dilatation or surrounding inflammatory changes. Spleen: Normal in size without focal abnormality. Adrenals/Urinary Tract: The adrenal glands are normal. There are simple cysts in both kidneys, largest in the right kidney measuring 9.4 x 10.4 cm unchanged. There are hyperdense lesions in the lower pole of the right kidney unchanged compared prior exam consistent with hemorrhagic cysts. There is no hydronephrosis bilaterally. The bladder is normal. Stomach/Bowel: There is a 7 cm segment of abnormal circumferential thickening of the sigmoid colon. There are adjacent abnormal enlarged pericolonic lymph node, the largest measures 1.3 cm. There is no small bowel obstruction. The patient status post prior appendectomy. Vascular/Lymphatic: The aorta is normal. There are small subcentimeter lymph nodes in the retroperitoneum. Reproductive: Prostate is unremarkable. Other: There is small umbilical herniation of mesenteric fat. Musculoskeletal: Degenerative joint changes of the spine are identified. There are small lucencies within the left sacrum unchanged compared to prior CT likely chronic change. No other focal discrete lytic or blastic lesion is identified. IMPRESSION: Circumferential thickening of the sigmoid colon with adjacent abnormal enlarged lymph nodes. The findings are suspicious for colonic neoplasm with adjacent lymph node extension. Nodular soft tissue in the gallbladder wall with gallstones. There is probable associated necrotic lymph node in the porta hepatis. 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 Findings are suspicious for neoplasm question of gallbladder origin. Electronically Signed   By: Abelardo Diesel M.D.   On: 03/22/2016 19:40   Dg Abd Acute W/chest  Result Date: 03/22/2016 CLINICAL DATA:  Episodes of nausea and vomiting over the last 2 weeks. EXAM: DG ABDOMEN ACUTE W/ 1V  CHEST COMPARISON:  None. FINDINGS: Heart size is normal. Mediastinal shadows are normal. The lungs are clear. No effusions. No free air seen under the diaphragm. Abdominal bowel gas pattern is within normal limits. No abnormal calcifications or acute bone findings. IMPRESSION: Negative radiographs. Gas pattern within normal limits. No free air. Electronically Signed   By: Nelson Chimes M.D.   On: 03/22/2016 17:17     Subjective: NO NEW COMPLAINTS.   Discharge Exam: Vitals:   03/24/16 0245 03/24/16 0541  BP:  100/61  Pulse:  78  Resp:  20  Temp: 98.2 F (36.8 C) 97.7 F (36.5 C)   Vitals:   03/24/16 0012 03/24/16 0221 03/24/16 0245 03/24/16 0541  BP:    100/61  Pulse:    78  Resp:    20  Temp: (!) 101.1 F (38.4 C) 99.5 F (37.5 C) 98.2 F (36.8 C) 97.7 F (36.5 C)  TempSrc: Oral Oral Oral Oral  SpO2:    97%  Weight:    95.4 kg (210 lb 5.1 oz)  Height:        General: Pt is alert, awake, not in acute distress Cardiovascular: RRR, S1/S2 +, no rubs, no gallops Respiratory: CTA bilaterally, no wheezing, no rhonchi Abdominal: Soft, NT, ND, bowel sounds + Extremities: no edema, no cyanosis    The results of significant diagnostics from this hospitalization (including imaging, microbiology, ancillary and laboratory) are listed below for reference.     Microbiology: Recent Results (from the past 240 hour(s))  Blood  Culture (routine x 2)     Status: None (Preliminary result)   Collection Time: 03/22/16  5:46 PM  Result Value Ref Range Status   Specimen Description BLOOD RIGHT ARM  Final   Special Requests BOTTLES DRAWN AEROBIC AND ANAEROBIC 6CC EACH  Final   Culture NO GROWTH 2 DAYS  Final   Report Status PENDING  Incomplete  Blood Culture (routine x 2)     Status: None (Preliminary result)   Collection Time: 03/22/16  5:49 PM  Result Value Ref Range Status   Specimen Description BLOOD LEFT ARM  Final   Special Requests BOTTLES DRAWN AEROBIC AND ANAEROBIC 6CC EACH   Final   Culture NO GROWTH 2 DAYS  Final   Report Status PENDING  Incomplete     Labs: BNP (last 3 results) No results for input(s): BNP in the last 8760 hours. Basic Metabolic Panel:  Recent Labs Lab 03/22/16 1640 03/22/16 1748 03/23/16 0519 03/24/16 0424  NA 131*  --  134* 132*  K 3.4*  --  4.0 3.3*  CL 96*  --  108 103  CO2 24  --  22 24  GLUCOSE 105*  --  99 99  BUN 11  --  11 9  CREATININE 0.88  --  0.77 0.66  CALCIUM 7.9*  --  6.4* 6.9*  MG  --  1.5* 1.9  --    Liver Function Tests:  Recent Labs Lab 03/22/16 1640 03/23/16 0519 03/24/16 0424  AST 164* 89* 59*  ALT 127* 84* 67*  ALKPHOS 104 72 69  BILITOT 0.8 0.3 0.3  PROT 7.9 5.6* 5.6*  ALBUMIN 2.4* 1.6* 1.6*    Recent Labs Lab 03/22/16 1640  LIPASE 60*   No results for input(s): AMMONIA in the last 168 hours. CBC:  Recent Labs Lab 03/22/16 1640 03/23/16 0519 03/24/16 0424  WBC 16.8* 11.2* 7.5  NEUTROABS 15.1*  --   --   HGB 11.0* 9.3* 8.8*  HCT 33.3* 28.5* 27.0*  MCV 84.7 86.4 86.0  PLT 324 232 227   Cardiac Enzymes: No results for input(s): CKTOTAL, CKMB, CKMBINDEX, TROPONINI in the last 168 hours. BNP: Invalid input(s): POCBNP CBG: No results for input(s): GLUCAP in the last 168 hours. D-Dimer No results for input(s): DDIMER in the last 72 hours. Hgb A1c No results for input(s): HGBA1C in the last 72 hours. Lipid Profile No results for input(s): CHOL, HDL, LDLCALC, TRIG, CHOLHDL, LDLDIRECT in the last 72 hours. Thyroid function studies No results for input(s): TSH, T4TOTAL, T3FREE, THYROIDAB in the last 72 hours.  Invalid input(s): FREET3 Anemia work up  Recent Labs  03/22/16 1748  VITAMINB12 438  FERRITIN 702*  TIBC 120*  IRON 8*  RETICCTPCT 0.9   Urinalysis    Component Value Date/Time   COLORURINE YELLOW 03/22/2016 [place] 03/22/2016 1959   LABSPEC 1.026 03/22/2016 1959   PHURINE 7.0 03/22/2016 1959   GLUCOSEU NEGATIVE 03/22/2016 1959   HGBUR  SMALL (A) 03/22/2016 1959   BILIRUBINUR NEGATIVE 03/22/2016 1959   KETONESUR NEGATIVE 03/22/2016 1959   PROTEINUR NEGATIVE 03/22/2016 1959   UROBILINOGEN 0.2 08/20/2012 0923   NITRITE NEGATIVE 03/22/2016 1959   LEUKOCYTESUR NEGATIVE 03/22/2016 1959   Sepsis Labs Invalid input(s): PROCALCITONIN,  WBC,  LACTICIDVEN Microbiology Recent Results (from the past 240 hour(s))  Blood Culture (routine x 2)     Status: None (Preliminary result)   Collection Time: 03/22/16  5:46 PM  Result Value Ref Range Status  Specimen Description BLOOD RIGHT ARM  Final   Special Requests BOTTLES DRAWN AEROBIC AND ANAEROBIC 6CC EACH  Final   Culture NO GROWTH 2 DAYS  Final   Report Status PENDING  Incomplete  Blood Culture (routine x 2)     Status: None (Preliminary result)   Collection Time: 03/22/16  5:49 PM  Result Value Ref Range Status   Specimen Description BLOOD LEFT ARM  Final   Special Requests BOTTLES DRAWN AEROBIC AND ANAEROBIC [place]  Final   Culture NO GROWTH 2 DAYS  Final   Report Status PENDING  Incomplete     Time coordinating discharge: Over 30 minutes  SIGNED:   Hosie Poisson, MD  [facility] 03/24/2016, 11:17 AM Pager   If 7PM-7AM, please contact night-coverage [email or web address] Password [email or web address]

## 2016-03-24 NOTE — Progress Notes (Signed)
Patient discharged from this hospital - [facility] here to transport to [facility].  Accepting Dr. Sallee Lange.  Report called to unit and given to [employer].  Patient aware of plan and agreeable.  All belongtings sent with patient.  Patient stable at this time. Left floor via stretcher with [facility] team.

## 2016-03-25 ENCOUNTER — Telehealth: Payer: Self-pay | Admitting: Gastroenterology

## 2016-03-25 LAB — HEPATITIS B SURFACE ANTIGEN: HEP B S AG: NEGATIVE

## 2016-03-25 LAB — HEPATITIS C ANTIBODY: HCV Ab: <0.1 s/co ratio (ref 0.0–0.9)

## 2016-03-25 NOTE — Telephone Encounter (Signed)
Reminder in epic °

## 2016-03-25 NOTE — Telephone Encounter (Signed)
LMOM to call.

## 2016-03-25 NOTE — Telephone Encounter (Signed)
PLEASE CALL PT. HIS COLON  BIOPSIES CONFIRM HE HAS SEGMENTAL COLITIS. HIS STOMACH BIOPISES SHOW BENIGN STOMACH POLYPS AND GASTRITIS. OPV IN 3 MOS E30 SEGMENTAL COLITIS, PV THROMBOSIS.

## 2016-03-28 LAB — CULTURE, BLOOD (ROUTINE X 2)
Culture: NO GROWTH
Culture: NO GROWTH

## 2016-03-29 NOTE — Telephone Encounter (Signed)
Phone not working. Mailing a letter to call.

## 2016-04-12 NOTE — Telephone Encounter (Signed)
Pt called and was informed of his results.

## 2016-05-20 ENCOUNTER — Encounter: Payer: Self-pay | Admitting: Gastroenterology

## 2017-03-22 DIAGNOSIS — Z5189 Encounter for other specified aftercare: Secondary | ICD-10-CM

## 2017-03-22 HISTORY — DX: Encounter for other specified aftercare: Z51.89

## 2018-07-21 DIAGNOSIS — K51911 Ulcerative colitis, unspecified with rectal bleeding: Secondary | ICD-10-CM

## 2018-07-21 HISTORY — DX: Ulcerative colitis, unspecified with rectal bleeding: K51.911

## 2018-08-15 ENCOUNTER — Encounter (HOSPITAL_COMMUNITY): Payer: Self-pay | Admitting: *Deleted

## 2018-08-15 ENCOUNTER — Inpatient Hospital Stay (HOSPITAL_COMMUNITY)
Admission: EM | Admit: 2018-08-15 | Discharge: 2018-08-17 | DRG: 386 | Disposition: A | Discharge status: Home / Self Care | Payer: 59 | Attending: Family Medicine | Admitting: Family Medicine

## 2018-08-15 ENCOUNTER — Telehealth: Payer: Self-pay | Admitting: Gastroenterology

## 2018-08-15 ENCOUNTER — Other Ambulatory Visit: Payer: Self-pay

## 2018-08-15 ENCOUNTER — Emergency Department (HOSPITAL_COMMUNITY): Payer: 59

## 2018-08-15 DIAGNOSIS — D500 Iron deficiency anemia secondary to blood loss (chronic): Secondary | ICD-10-CM | POA: Diagnosis not present

## 2018-08-15 DIAGNOSIS — Z8249 Family history of ischemic heart disease and other diseases of the circulatory system: Secondary | ICD-10-CM | POA: Diagnosis not present

## 2018-08-15 DIAGNOSIS — K922 Gastrointestinal hemorrhage, unspecified: Secondary | ICD-10-CM | POA: Diagnosis not present

## 2018-08-15 DIAGNOSIS — Z823 Family history of stroke: Secondary | ICD-10-CM

## 2018-08-15 DIAGNOSIS — K5730 Diverticulosis of large intestine without perforation or abscess without bleeding: Secondary | ICD-10-CM | POA: Diagnosis present

## 2018-08-15 DIAGNOSIS — D75839 Thrombocytosis, unspecified: Secondary | ICD-10-CM

## 2018-08-15 DIAGNOSIS — R7989 Other specified abnormal findings of blood chemistry: Secondary | ICD-10-CM | POA: Diagnosis present

## 2018-08-15 DIAGNOSIS — K219 Gastro-esophageal reflux disease without esophagitis: Secondary | ICD-10-CM | POA: Diagnosis present

## 2018-08-15 DIAGNOSIS — K51011 Ulcerative (chronic) pancolitis with rectal bleeding: Principal | ICD-10-CM | POA: Diagnosis present

## 2018-08-15 DIAGNOSIS — N179 Acute kidney failure, unspecified: Secondary | ICD-10-CM | POA: Diagnosis present

## 2018-08-15 DIAGNOSIS — Z9049 Acquired absence of other specified parts of digestive tract: Secondary | ICD-10-CM

## 2018-08-15 DIAGNOSIS — D62 Acute posthemorrhagic anemia: Secondary | ICD-10-CM | POA: Diagnosis present

## 2018-08-15 DIAGNOSIS — Z20828 Contact with and (suspected) exposure to other viral communicable diseases: Secondary | ICD-10-CM | POA: Diagnosis present

## 2018-08-15 DIAGNOSIS — D649 Anemia, unspecified: Secondary | ICD-10-CM | POA: Diagnosis not present

## 2018-08-15 DIAGNOSIS — R1013 Epigastric pain: Secondary | ICD-10-CM | POA: Insufficient documentation

## 2018-08-15 DIAGNOSIS — D473 Essential (hemorrhagic) thrombocythemia: Secondary | ICD-10-CM

## 2018-08-15 DIAGNOSIS — E876 Hypokalemia: Secondary | ICD-10-CM | POA: Diagnosis present

## 2018-08-15 LAB — PROTIME-INR
INR: 1.2 (ref 0.8–1.2)
Prothrombin Time: 14.8 seconds (ref 11.4–15.2)

## 2018-08-15 LAB — COMPREHENSIVE METABOLIC PANEL
ALT: 16 U/L (ref 0–44)
AST: 20 U/L (ref 15–41)
Albumin: 2.5 g/dL — ABNORMAL LOW (ref 3.5–5.0)
Alkaline Phosphatase: 64 U/L (ref 38–126)
Anion gap: 10 (ref 5–15)
BUN: 13 mg/dL (ref 6–20)
CO2: 22 mmol/L (ref 22–32)
Calcium: 7.9 mg/dL — ABNORMAL LOW (ref 8.9–10.3)
Chloride: 102 mmol/L (ref 98–111)
Creatinine, Ser: 1.35 mg/dL — ABNORMAL HIGH (ref 0.61–1.24)
GFR calc Af Amer: >60 mL/min (ref >60)
GFR calc non Af Amer: 58 mL/min — ABNORMAL LOW (ref >60)
Glucose, Bld: 108 mg/dL — ABNORMAL HIGH (ref 70–99)
Potassium: 3.2 mmol/L — ABNORMAL LOW (ref 3.5–5.1)
Sodium: 134 mmol/L — ABNORMAL LOW (ref 135–145)
Total Bilirubin: 0.6 mg/dL (ref 0.3–1.2)
Total Protein: 7.4 g/dL (ref 6.5–8.1)

## 2018-08-15 LAB — IRON AND TIBC
Iron: 5 ug/dL — ABNORMAL LOW (ref 45–182)
Saturation Ratios: 2 % — ABNORMAL LOW (ref 17.9–39.5)
TIBC: 221 ug/dL — ABNORMAL LOW (ref 250–450)
UIBC: 216 ug/dL

## 2018-08-15 LAB — CBC
HCT: 27.3 % — ABNORMAL LOW (ref 39.0–52.0)
Hemoglobin: 8.1 g/dL — ABNORMAL LOW (ref 13.0–17.0)
MCH: 20.4 pg — ABNORMAL LOW (ref 26.0–34.0)
MCHC: 29.7 g/dL — ABNORMAL LOW (ref 30.0–36.0)
MCV: 68.8 fL — ABNORMAL LOW (ref 80.0–100.0)
Platelets: 436 10*3/uL — ABNORMAL HIGH (ref 150–400)
RBC: 3.97 MIL/uL — ABNORMAL LOW (ref 4.22–5.81)
RDW: 18.2 % — ABNORMAL HIGH (ref 11.5–15.5)
WBC: 8.3 10*3/uL (ref 4.0–10.5)
nRBC: 0 % (ref 0.0–0.2)

## 2018-08-15 LAB — HEMOGLOBIN AND HEMATOCRIT, BLOOD
HCT: 23.6 % — ABNORMAL LOW (ref 39.0–52.0)
Hemoglobin: 6.9 g/dL — CL (ref 13.0–17.0)

## 2018-08-15 LAB — FERRITIN: Ferritin: 5 ng/mL — ABNORMAL LOW (ref 24–336)

## 2018-08-15 LAB — SARS CORONAVIRUS 2 BY RT PCR (HOSPITAL ORDER, PERFORMED IN ~~LOC~~ HOSPITAL LAB): SARS Coronavirus 2: NEGATIVE

## 2018-08-15 LAB — MAGNESIUM: Magnesium: 2 mg/dL (ref 1.7–2.4)

## 2018-08-15 LAB — PREPARE RBC (CROSSMATCH)

## 2018-08-15 LAB — POC OCCULT BLOOD, ED: Fecal Occult Bld: POSITIVE — AB

## 2018-08-15 LAB — APTT: aPTT: 28 seconds (ref 24–36)

## 2018-08-15 LAB — LIPASE, BLOOD: Lipase: 35 U/L (ref 11–51)

## 2018-08-15 MED ORDER — SODIUM CHLORIDE 0.9 % IV BOLUS
1000.0000 mL | Freq: Once | INTRAVENOUS | Status: AC
  Administered 2018-08-15: 13:00:00 1000 mL via INTRAVENOUS

## 2018-08-15 MED ORDER — SODIUM CHLORIDE 0.9 % IV SOLN
80.0000 mg | Freq: Once | INTRAVENOUS | Status: AC
  Administered 2018-08-15: 14:00:00 80 mg via INTRAVENOUS
  Filled 2018-08-15: qty 80

## 2018-08-15 MED ORDER — POTASSIUM CHLORIDE 10 MEQ/100ML IV SOLN
10.0000 meq | INTRAVENOUS | Status: AC
  Administered 2018-08-15 (×4): 10 meq via INTRAVENOUS
  Filled 2018-08-15 (×4): qty 100

## 2018-08-15 MED ORDER — ACETAMINOPHEN 650 MG RE SUPP: 650.0000 mg | Freq: Four times a day (QID) | RECTAL | Status: DC | PRN

## 2018-08-15 MED ORDER — ONDANSETRON HCL 4 MG/2ML IJ SOLN: 4.0000 mg | Freq: Four times a day (QID) | INTRAMUSCULAR | Status: DC | PRN

## 2018-08-15 MED ORDER — SODIUM CHLORIDE 0.9% IV SOLUTION
Freq: Once | INTRAVENOUS | Status: AC
  Administered 2018-08-15: 22:00:00 via INTRAVENOUS

## 2018-08-15 MED ORDER — SODIUM CHLORIDE 0.9 % IV SOLN
8.0000 mg/h | INTRAVENOUS | Status: DC
  Administered 2018-08-15 – 2018-08-17 (×4): 8 mg/h via INTRAVENOUS
  Filled 2018-08-15 (×8): qty 80

## 2018-08-15 MED ORDER — ACETAMINOPHEN 325 MG PO TABS: 650.0000 mg | ORAL_TABLET | Freq: Four times a day (QID) | ORAL | Status: DC | PRN

## 2018-08-15 MED ORDER — POTASSIUM CHLORIDE IN NACL 20-0.9 MEQ/L-% IV SOLN
INTRAVENOUS | Status: DC
  Administered 2018-08-15 – 2018-08-17 (×3): via INTRAVENOUS

## 2018-08-15 MED ORDER — ONDANSETRON HCL 4 MG PO TABS: 4.0000 mg | ORAL_TABLET | Freq: Four times a day (QID) | ORAL | Status: DC | PRN

## 2018-08-15 NOTE — ED Triage Notes (Signed)
Pt c/o bright red rectal bleeding x 10 days. Pt reports the rectal bleeding has gotten heavier since last Wednesday. Pt called Dr. Nona Dell office and he was advised to come to the ED. Pt reports he has lost18lbs in the last 10 days. Pt also c/o acid reflux that started 10 days ago and he started taking Prilosec which has helped.

## 2018-08-15 NOTE — ED Notes (Signed)
Report given to 300 unit but have to wait for transfer to unit until covid test results.

## 2018-08-15 NOTE — Telephone Encounter (Signed)
PLEASE CALL PT. HE HASN'T BEEN SEEN IN OVER 2 YRS. IF HE IS HAVING BLOODY STOOL HE SHOULD GO TO THE NEAREST ED.

## 2018-08-15 NOTE — Progress Notes (Addendum)
CRITICAL VALUE ALERT  Critical Value:  Hgb 6.9  Date & Time Notied:  08/15/18  Provider Notified: [place]  Orders Received/Actions taken: blood transfusion ordered

## 2018-08-15 NOTE — Telephone Encounter (Signed)
Pt called asking to make OV with SF. I offered him OV for 6/4. Pt wanted to be seen this week. I told him we didn't have openings this week and if we did it would have to be a virtual telephone visit. Pt said he is seeing blood in stool and having problems with his reflux. He thinks it could be an ulcer. He wanted to speak with the nurse first before making appt. [phone]

## 2018-08-15 NOTE — Telephone Encounter (Signed)
PT is aware.

## 2018-08-15 NOTE — H&P (Signed)
History and Physical  [name] DOB: [birth year] DOA: 08/15/2018   PCP: Patient, No Pcp Per   Patient coming from: Home  Chief Complaint: hematochezia  HPI:  Brett Bright is a 58 y.o. male with medical history of chronic cholecystitis status post cholecystectomy January 2018 and no other documented chronic medical problems presenting with loose stools and hematochezia for the past 10 days.  The patient denies any recent travels or eating any unusual or exotic foods.  He denies any recent antibiotics.  He has had small-volume hematochezia associated with the loose stools.  He is also had some intermittent nausea and vomiting for the past 10 days without any hematemesis.  He denies any NSAID use.  He denies any other over-the-counter medication use.  He does not drink any alcohol or smoke any cigarettes.  He denies any illegal drug use.  The patient denies any abdominal pain but complains of some intermittent "sour stomach" after eating heavy meals.  He denies any fevers, chills, chest pain, shortness breath, dysuria, hematuria.  He has had some dizziness.  The patient contacted Dr. Barney Drain today, and he was advised to go to the emergency department for further evaluation. In the emergency department, the patient was afebrile hemodynamically stable although he had some mild tachycardia in the 110s.  Oxygen saturation was 96% room air.  Hemoglobin was 8.1.  BMP showed a potassium of 3.2 with serum creatinine 1.35.  Assessment/Plan: Symptomatic anemia/hematochezia -03/23/2016 colonoscopy--diverticulosis, segmental sigmoid colitis -03/23/2016 EGD--mild gastritis and duodenitis; multiple gastric polyps -Monitor serial hemoglobin -Continue Protonix drip -GI consult -Clear liquid diet -Type and screen  Diarrhea -GI pathogen panel  AKI -Secondary to volume depletion -Baseline creatinine 0.6-0.7 -Presented with serum creatinine 1.35  Hypokalemia -Replete  -Check magnesium  Microcytic anemia -Check iron studies  Thrombocytosis -Suspect this may be due to iron deficiency anemia as well as a marker for acute inflammatory process        Past Medical History:  Diagnosis Date  . Medical history non-contributory    Past Surgical History:  Procedure Laterality Date  . APPENDECTOMY    . CHOLECYSTECTOMY    . COLONOSCOPY N/A 03/23/2016   Procedure: COLONOSCOPY;  Surgeon: Danie Binder, MD;  Location: [facility];  Service: Endoscopy;  Laterality: N/A;  . ESOPHAGOGASTRODUODENOSCOPY N/A 03/23/2016   Procedure: ESOPHAGOGASTRODUODENOSCOPY (EGD);  Surgeon: Danie Binder, MD;  Location: [facility];  Service: Endoscopy;  Laterality: N/A;  . VASECTOMY     Social History:  reports that he has never smoked. He has never used smokeless tobacco. He reports that he does not drink alcohol or use drugs.   Family History  Problem Relation Age of Onset  . Stroke Mother   . Cancer Maternal Grandfather        Unknown type of cancer  . Heart attack Paternal Grandmother   . Cancer Maternal Aunt        Possibly lung cancer  . Cancer Paternal Uncle        Unknown type of cancer  . Cancer Paternal Uncle        Unknown type of cancer  . Colon cancer Neg Hx      No Known Allergies   Prior to Admission medications   Medication Sig Start Date End Date Taking? Authorizing Provider  acetaminophen (TYLENOL) 325 MG tablet Take 2 tablets (650 mg total) by mouth every 6 (six) hours as needed for mild pain (or Fever >/=  101). 03/24/16   Hosie Poisson, MD  heparin 100-0.45 UNIT/ML-% infusion Inject 1,800 Units/hr into the vein continuous. 03/24/16   Hosie Poisson, MD  morphine 2 MG/ML injection Inject 1 mL (2 mg total) into the vein every 2 (two) hours as needed. 03/24/16   Hosie Poisson, MD  oxyCODONE (OXY IR/ROXICODONE) 5 MG immediate release tablet Take 1 tablet (5 mg total) by mouth every 4 (four) hours as needed for moderate pain. 03/24/16   Hosie Poisson,  MD  pantoprazole (PROTONIX) 40 MG tablet Take 1 tablet (40 mg total) by mouth daily before breakfast. 03/24/16   Hosie Poisson, MD  piperacillin-tazobactam (ZOSYN) 3.375 GM/50ML IVPB Inject 50 mLs (3.375 g total) into the vein every 8 (eight) hours. 03/24/16   Hosie Poisson, MD    Review of Systems:  Constitutional:  No weight loss, night sweats, Fevers, chills Head&Eyes: No headache.  No vision loss.  No eye pain or scotoma ENT:  No Difficulty swallowing,Tooth/dental problems,Sore throat,  No ear ache, post nasal drip,  Cardio-vascular:  No chest pain, Orthopnea, PND, swelling in lower extremities,  dizziness, palpitations  GI:  No  abdominal pain, nausea, vomiting, diarrhea, loss of appetite, hematochezia, melena, heartburn, indigestion, Resp:  No shortness of breath with exertion or at rest. No cough. No coughing up of blood .No wheezing.No chest wall deformity  Skin:  no rash or lesions.  GU:  no dysuria, change in color of urine, no urgency or frequency. No flank pain.  Musculoskeletal:  No joint pain or swelling. No decreased range of motion. No back pain.  Psych:  No change in mood or affect. No depression or anxiety. Neurologic: No headache, no dysesthesia, no focal weakness, no vision loss. No syncope  Physical Exam: Vitals:   08/15/18 1300 08/15/18 1330 08/15/18 1400 08/15/18 1430  BP: 118/89 116/77 118/90 121/76  Pulse: (!) 110 (!) 105 (!) 103 (!) 106  Resp: 18 11 16 19   Temp:      TempSrc:      SpO2: 96% 96% 100% 98%  Weight:      Height:       General:  A&O x 3, NAD, nontoxic, pleasant/cooperative Head/Eye: No conjunctival hemorrhage, no icterus, [place]/AT, No nystagmus ENT:  No icterus,  No thrush, good dentition, no pharyngeal exudate Neck:  No masses, no lymphadenpathy, no bruits CV:  RRR, no rub, no gallop, no S3 Lung:  CTAB, good air movement, no wheeze, no rhonchi Abdomen: soft/NT, +BS, nondistended, no peritoneal signs Ext: No cyanosis, No rashes, No  petechiae, No lymphangitis, No edema Neuro: CNII-XII intact, strength 4/5 in bilateral upper and lower extremities, no dysmetria  Labs on Admission:  Basic Metabolic Panel: Recent Labs  Lab 08/15/18 1314  NA 134*  K 3.2*  CL 102  CO2 22  GLUCOSE 108*  BUN 13  CREATININE 1.35*  CALCIUM 7.9*   Liver Function Tests: Recent Labs  Lab 08/15/18 1314  AST 20  ALT 16  ALKPHOS 64  BILITOT 0.6  PROT 7.4  ALBUMIN 2.5*   Recent Labs  Lab 08/15/18 1314  LIPASE 35   No results for input(s): AMMONIA in the last 168 hours. CBC: Recent Labs  Lab 08/15/18 1314  WBC 8.3  HGB 8.1*  HCT 27.3*  MCV 68.8*  PLT 436*   Coagulation Profile: No results for input(s): INR, PROTIME in the last 168 hours. Cardiac Enzymes: No results for input(s): CKTOTAL, CKMB, CKMBINDEX, TROPONINI in the last 168 hours. BNP: Invalid input(s): POCBNP CBG: No  results for input(s): GLUCAP in the last 168 hours. Urine analysis:    Component Value Date/Time   COLORURINE YELLOW 03/22/2016 1959   APPEARANCEUR CLEAR 03/22/2016 1959   LABSPEC 1.026 03/22/2016 1959   PHURINE 7.0 03/22/2016 1959   GLUCOSEU NEGATIVE 03/22/2016 1959   HGBUR SMALL (A) 03/22/2016 [place] NEGATIVE 03/22/2016 1959   KETONESUR NEGATIVE 03/22/2016 1959   PROTEINUR NEGATIVE 03/22/2016 1959   UROBILINOGEN 0.2 08/20/2012 0923   NITRITE NEGATIVE 03/22/2016 1959   LEUKOCYTESUR NEGATIVE 03/22/2016 1959   Sepsis Labs: @LABRCNTIP (procalcitonin:4,lacticidven:4) )No results found for this or any previous visit (from the past 240 hour(s)).   Radiological Exams on Admission: Dg Chest Port 1 View  Result Date: 08/15/2018 CLINICAL DATA:  Epigastric abdominal pain. EXAM: PORTABLE CHEST 1 VIEW COMPARISON:  Radiographs of March 22, 2016. FINDINGS: The heart size and mediastinal contours are within normal limits. Both lungs are clear. No pneumothorax or pleural effusion is noted. The visualized skeletal structures are  unremarkable. IMPRESSION: No active disease. Electronically Signed   By: Marijo Conception M.D.   On: 08/15/2018 13:29    EKG: Independently reviewed. Sinus no STT changes    Time spent:60 minutes Code Status:   FULL Family Communication:  No Family at bedside Disposition Plan: expect 1-2 day hospitalization Consults called: [facility]  DVT Prophylaxis:   SCDs  Orson Eva, DO  [facility] Pager [phone]  If 7PM-7AM, please contact night-coverage [email or web address] Password [email or web address] 08/15/2018, 2:45 PM

## 2018-08-15 NOTE — ED Provider Notes (Signed)
[facility] Emergency Department Provider Note MRN:  [id number]  Arrival date & time: 08/15/18     Chief Complaint   Rectal Bleeding   History of Present Illness   Brett Bright is a 58 y.o. 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  10 days of gradual onset, progressively worsening rectal bleeding.  The bleeding is painless, denies rectal pain.  Was initially a small amount, has progressed to multiple larger volumes of bright red blood throughout the day.  Blood incorporated within the stool.  Denies dizziness or lightheadedness, no headache or vision change, no chest pain or shortness of breath, no fever.  Endorsing 1-2 episodes of nonbloody nonbilious emesis.  Has been endorsing some intermittent epigastric pain that he attributes to his GERD, has been taking antacids with improvement.  Denies lower abdominal pain.  Endorsing decreased appetite, 14 pound weight loss in the past few weeks.  Review of Systems  A complete 10 system review of systems was obtained and all systems are negative except as noted in the HPI and PMH.   Patient's Health History    Past Medical History:  Diagnosis Date  . Medical history non-contributory     Past Surgical History:  Procedure Laterality Date  . APPENDECTOMY    . CHOLECYSTECTOMY    . COLONOSCOPY N/A 03/23/2016   Procedure: COLONOSCOPY;  Surgeon: Danie Binder, MD;  Location: [facility];  Service: Endoscopy;  Laterality: N/A;  . ESOPHAGOGASTRODUODENOSCOPY N/A 03/23/2016   Procedure: ESOPHAGOGASTRODUODENOSCOPY (EGD);  Surgeon: Danie Binder, MD;  Location: [facility];  Service: Endoscopy;  Laterality: N/A;  . VASECTOMY      Family History  Problem Relation Age of Onset  . Stroke Mother   . Cancer Maternal Grandfather        Unknown type of cancer  . Heart attack Paternal Grandmother   . Cancer Maternal Aunt       Possibly lung cancer  . Cancer Paternal Uncle        Unknown type of cancer  . Cancer Paternal Uncle        Unknown type of cancer  . Colon cancer Neg Hx     Social History   Socioeconomic History  . Marital status: Married    Spouse name: Not on file  . Number of children: 3  . Years of education: Not on file  . Highest education level: Not on file  Occupational History  . Occupation: Primary school teacher    Comment: At [employer]  . Financial resource strain: Not on file  . Food insecurity:    Worry: Not on file    Inability: Not on file  . Transportation needs:    Medical: Not on file    Non-medical: Not on file  Tobacco Use  . Smoking status: Never Smoker  . Smokeless tobacco: Never Used  Substance and Sexual Activity  . Alcohol use: No  . Drug use: No  . Sexual activity: Not on file  Lifestyle  . Physical activity:    Days per week: Not on file    Minutes per session: Not on file  . Stress: Not on file  Relationships  . Social connections:    Talks on phone: Not on file    Gets together: Not on file    Attends religious service: Not on file    Active  member of club or organization: Not on file    Attends meetings of clubs or organizations: Not on file    Relationship status: Not on file  . Intimate partner violence:    Fear of current or ex partner: Not on file    Emotionally abused: Not on file    Physically abused: Not on file    Forced sexual activity: Not on file  Other Topics Concern  . 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   He is married and has 3 children, who are ages 17, 101, and 23 as of 2017.   The 25 and 49 year old children are still living at home, but the 57 yo is married and lives elsewhere.   The patient lives in [place], [place].      His primary care is [facility] in [place], [place].     Physical Exam  Vital Signs and Nursing Notes  reviewed Vitals:   08/15/18 1300 08/15/18 1330  BP: 118/89 116/77  Pulse: (!) 110 (!) 105  Resp: 18 11  Temp:    SpO2: 96% 96%    CONSTITUTIONAL: Well-appearing, NAD NEURO:  Alert and oriented x 3, no focal deficits EYES:  eyes equal and reactive ENT/NECK:  no LAD, no JVD CARDIO: Tachycardic rate, well-perfused, normal S1 and S2 PULM:  CTAB no wheezing or rhonchi GI/GU:  normal bowel sounds, non-distended, non-tender; small nontender external hemorrhoid without signs of bleeding, no gross blood, normal rectal tone MSK/SPINE:  No gross deformities, no edema SKIN:  no rash, atraumatic PSYCH:  Appropriate speech and behavior  Diagnostic and Interventional Summary    EKG Interpretation  Date/Time:  Tuesday Aug 15 2018 13:07:10 EDT Ventricular Rate:  119 PR Interval:    QRS Duration: 92 QT Interval:  337 QTC Calculation: 467 R Axis:   138 Text Interpretation:  Sinus tachycardia Ventricular premature complex Probable inferior infarct, old Confirmed by Gerlene Fee [phone]) on 08/15/2018 2:01:26 PM      Labs Reviewed  COMPREHENSIVE METABOLIC PANEL - Abnormal; Notable for the following components:      Result Value   Sodium 134 (*)    Potassium 3.2 (*)    Glucose, Bld 108 (*)    Creatinine, Ser 1.35 (*)    Calcium 7.9 (*)    Albumin 2.5 (*)    GFR calc non Af Amer 58 (*)    All other components within normal limits  CBC - Abnormal; Notable for the following components:   RBC 3.97 (*)    Hemoglobin 8.1 (*)    HCT 27.3 (*)    MCV 68.8 (*)    MCH 20.4 (*)    MCHC 29.7 (*)    RDW 18.2 (*)    Platelets 436 (*)    All other components within normal limits  POC OCCULT BLOOD, ED - Abnormal; Notable for the following components:   Fecal Occult Bld POSITIVE (*)    All other components within normal limits  LIPASE, BLOOD  TYPE AND SCREEN    DG Chest Port 1 View  Final Result      Medications  pantoprazole (PROTONIX) 80 mg in sodium chloride 0.9 % 250 mL (0.32 mg/mL)  infusion (has no administration in time range)  sodium chloride 0.9 % bolus 1,000 mL (1,000 mLs Intravenous New Bag/Given 08/15/18 1309)  pantoprazole (PROTONIX) 80 mg in sodium chloride 0.9 % 100 mL  IVPB (80 mg Intravenous New Bag/Given 08/15/18 1350)     Procedures Critical Care Critical Care Documentation Critical care time provided by me (excluding procedures): 32 minutes  Condition necessitating critical care: acute GI bleeding with symptomatic anemia  Components of critical care management: reviewing of prior records, laboratory and imaging interpretation, frequent re-examination and reassessment of vital signs, administration of IV fluids, IV Protonix, discussion with consulting services    ED Course and Medical Decision Making  I have reviewed the triage vital signs and the nursing notes.  Pertinent labs & imaging results that were available during my care of the patient were reviewed by me and considered in my medical decision making (see below for details).  Question of diverticulosis as the source of bleeding versus brisk upper GI bleed related to patient's history of GERD.  Denies history of cirrhosis, no hematemesis.  Heart rate 117, blood pressure in the 697X systolic, seems to be his baseline according to prior documentation.  Given his weight loss, will consider CT imaging to exclude neoplasm.  Labs reveal anemia, positive Hemoccult, admitted to hospital service for further care.  Barth Kirks. Sedonia Small, [employer] [email or web address]  Final Clinical Impressions(s) / ED Diagnoses     ICD-10-CM   1. Epigastric pain R10.13 DG Chest [facility] 1 View    DG Chest Port 1 View  2. Acute GI bleeding K92.2   3. Symptomatic anemia D64.9     ED Discharge Orders    None         Maudie Flakes, MD 08/15/18 1429

## 2018-08-15 NOTE — Telephone Encounter (Signed)
PT said he has had diarrhea for the last several weeks and sometimes 6-8 times a day.  Most every time it is bright red blood in it also. He previously had some reflux issues, but the Prilosec is helping. He has no pain and no fever.  Michela Pitcher he has probably lost 12-14 lbs over the last few weeks.  He is aware we did not have appt with Dr. Oneida Alar until next week. Dr. Oneida Alar, please advise!

## 2018-08-16 ENCOUNTER — Encounter (HOSPITAL_COMMUNITY): Payer: Self-pay | Admitting: Gastroenterology

## 2018-08-16 ENCOUNTER — Encounter (HOSPITAL_COMMUNITY)
Admission: EM | Disposition: A | Discharge status: Home / Self Care | Payer: Self-pay | Source: Home / Self Care | Attending: Family Medicine

## 2018-08-16 DIAGNOSIS — D500 Iron deficiency anemia secondary to blood loss (chronic): Secondary | ICD-10-CM

## 2018-08-16 DIAGNOSIS — D649 Anemia, unspecified: Secondary | ICD-10-CM

## 2018-08-16 DIAGNOSIS — K922 Gastrointestinal hemorrhage, unspecified: Secondary | ICD-10-CM

## 2018-08-16 DIAGNOSIS — R1013 Epigastric pain: Secondary | ICD-10-CM

## 2018-08-16 HISTORY — PX: COLONOSCOPY: SHX5424

## 2018-08-16 HISTORY — PX: ESOPHAGOGASTRODUODENOSCOPY: SHX5428

## 2018-08-16 HISTORY — PX: BIOPSY: SHX5522

## 2018-08-16 LAB — BASIC METABOLIC PANEL
Anion gap: 8 (ref 5–15)
BUN: 11 mg/dL (ref 6–20)
CO2: 24 mmol/L (ref 22–32)
Calcium: 7.8 mg/dL — ABNORMAL LOW (ref 8.9–10.3)
Chloride: 106 mmol/L (ref 98–111)
Creatinine, Ser: 1.01 mg/dL (ref 0.61–1.24)
GFR calc Af Amer: >60 mL/min (ref >60)
GFR calc non Af Amer: >60 mL/min (ref >60)
Glucose, Bld: 89 mg/dL (ref 70–99)
Potassium: 3.7 mmol/L (ref 3.5–5.1)
Sodium: 138 mmol/L (ref 135–145)

## 2018-08-16 LAB — CBC
HCT: 26.2 % — ABNORMAL LOW (ref 39.0–52.0)
Hemoglobin: 7.4 g/dL — ABNORMAL LOW (ref 13.0–17.0)
MCH: 20.6 pg — ABNORMAL LOW (ref 26.0–34.0)
MCHC: 28.2 g/dL — ABNORMAL LOW (ref 30.0–36.0)
MCV: 72.8 fL — ABNORMAL LOW (ref 80.0–100.0)
Platelets: 332 10*3/uL (ref 150–400)
RBC: 3.6 MIL/uL — ABNORMAL LOW (ref 4.22–5.81)
RDW: 19 % — ABNORMAL HIGH (ref 11.5–15.5)
WBC: 6 10*3/uL (ref 4.0–10.5)
nRBC: 0 % (ref 0.0–0.2)

## 2018-08-16 LAB — HIV ANTIBODY (ROUTINE TESTING W REFLEX): HIV Screen 4th Generation wRfx: NONREACTIVE

## 2018-08-16 SURGERY — COLONOSCOPY
Anesthesia: Moderate Sedation

## 2018-08-16 MED ORDER — BISACODYL 5 MG PO TBEC
10.0000 mg | DELAYED_RELEASE_TABLET | Freq: Once | ORAL | Status: AC
  Administered 2018-08-16: 10 mg via ORAL
  Filled 2018-08-16: qty 2

## 2018-08-16 MED ORDER — PRO-STAT SUGAR FREE PO LIQD
30.0000 mL | Freq: Two times a day (BID) | ORAL | Status: DC
  Administered 2018-08-16 – 2018-08-17 (×2): 30 mL via ORAL
  Filled 2018-08-16 (×2): qty 30

## 2018-08-16 MED ORDER — PEG 3350-KCL-NA BICARB-NACL 420 G PO SOLR
4000.0000 mL | Freq: Once | ORAL | Status: AC
  Administered 2018-08-16: 4000 mL via ORAL

## 2018-08-16 MED ORDER — MESALAMINE 1.2 G PO TBEC
4.8000 g | DELAYED_RELEASE_TABLET | Freq: Every day | ORAL | Status: DC
  Administered 2018-08-16 – 2018-08-17 (×2): 4.8 g via ORAL
  Filled 2018-08-16 (×4): qty 4

## 2018-08-16 MED ORDER — LIDOCAINE VISCOUS HCL 2 % MT SOLN
OROMUCOSAL | Status: DC | PRN
  Administered 2018-08-16: 1 via OROMUCOSAL

## 2018-08-16 MED ORDER — MIDAZOLAM HCL 5 MG/5ML IJ SOLN
INTRAMUSCULAR | Status: DC | PRN
  Administered 2018-08-16: 2 mg via INTRAVENOUS
  Administered 2018-08-16 (×2): 1 mg via INTRAVENOUS
  Administered 2018-08-16: 2 mg via INTRAVENOUS

## 2018-08-16 MED ORDER — PREDNISONE 20 MG PO TABS
40.0000 mg | ORAL_TABLET | Freq: Every day | ORAL | Status: DC
  Administered 2018-08-16 – 2018-08-17 (×2): 40 mg via ORAL
  Filled 2018-08-16 (×2): qty 2

## 2018-08-16 MED ORDER — PEG 3350-KCL-NABCB-NACL-NASULF 236 G PO SOLR
4000.0000 mL | Freq: Once | ORAL | Status: DC
  Filled 2018-08-16: qty 4000

## 2018-08-16 MED ORDER — SODIUM CHLORIDE 0.9 % IV SOLN: INTRAVENOUS | Status: DC

## 2018-08-16 MED ORDER — PANTOPRAZOLE SODIUM 40 MG IV SOLR
INTRAVENOUS | Status: AC
  Filled 2018-08-16: qty 80

## 2018-08-16 MED ORDER — MEPERIDINE HCL 50 MG/ML IJ SOLN
INTRAMUSCULAR | Status: AC
  Filled 2018-08-16: qty 1

## 2018-08-16 MED ORDER — MEPERIDINE HCL 100 MG/ML IJ SOLN
INTRAMUSCULAR | Status: DC | PRN
  Administered 2018-08-16: 25 mg via INTRAVENOUS
  Administered 2018-08-16: 15 mg via INTRAVENOUS

## 2018-08-16 MED ORDER — LIDOCAINE VISCOUS HCL 2 % MT SOLN
OROMUCOSAL | Status: AC
  Filled 2018-08-16: qty 15

## 2018-08-16 MED ORDER — BOOST / RESOURCE BREEZE PO LIQD CUSTOM
1.0000 | Freq: Two times a day (BID) | ORAL | Status: DC
  Administered 2018-08-16 – 2018-08-17 (×2): 1 via ORAL

## 2018-08-16 MED ORDER — STERILE WATER FOR IRRIGATION IR SOLN
Status: DC | PRN
  Administered 2018-08-16: 1.5 mL

## 2018-08-16 MED ORDER — ONDANSETRON HCL 4 MG/2ML IJ SOLN
INTRAMUSCULAR | Status: DC | PRN
  Administered 2018-08-16: 4 mg via INTRAVENOUS

## 2018-08-16 MED ORDER — MIDAZOLAM HCL 5 MG/5ML IJ SOLN
INTRAMUSCULAR | Status: AC
  Filled 2018-08-16: qty 10

## 2018-08-16 MED ORDER — ONDANSETRON HCL 4 MG/2ML IJ SOLN
INTRAMUSCULAR | Status: AC
  Filled 2018-08-16: qty 2

## 2018-08-16 NOTE — Progress Notes (Signed)
Patient tolerated bowel prep and tap water enema. Stools are clear liquid. Endo notified.  Deirdre Pippins, RN

## 2018-08-16 NOTE — Progress Notes (Signed)
Patient has signed consent for colonoscopy with possible EGD; dulcolax administered and bowel prep has begun; will monitor for clear stools throughout prep. Deirdre Pippins, RN

## 2018-08-16 NOTE — Progress Notes (Signed)
Initial Nutrition Assessment  DOCUMENTATION CODES:  Not applicable  INTERVENTION:  Boost Breeze po BID, each supplement provides 250 kcal and 9 grams of protein  Will order 30 mL Prostat BID, each supplement provides 100 kcal and 15 grams of protein.  If appropriate for patients current condition, would recommend addition of probitioc- either visbiome (formerly known as VSL #3) or lactobacillus reuteri, as these probiotics have shown some efficacy inducing and maintaining remission in UC.   Given he is being started on steroid therapy, would consider checking Vit D status and supplementing if needed.   NUTRITION DIAGNOSIS:  Increased nutrient needs related to acute illness(UC flare) as evidenced by the nutritional recommendations for this condition  GOAL:  Patient will meet greater than or equal to 90% of their needs  MONITOR:  PO intake, Labs, I & O's, Supplement acceptance, Diet advancement, Weight trends  REASON FOR ASSESSMENT:  Malnutrition Screening Tool    ASSESSMENT:  58 y/o male PMHx GERD, cholecystectomy. Presents w/ loose stools (6-8/day) and hematochezia x10 days. Reports postprandial abdominal discomfort and weight loss. Advised to go to ED by GI provider. In ED, found to have symptomatic anemia w/ Hgb of 8.1. Admitted for further workup.   RD operating remotely d/t covid precautions. RD attempted to reach pt by phone at two different times of day, but was unsuccessful. All info obtained from chart.    Per chart, pt reports an astounding loss of 12-18 lbs in 10 days (3 different amounts of wt loss are documented). Comparing this loss to objective data is difficult d/t scarcity of chart info. Do note that he was documented at s being >200 lbs at time of his cholecystectomy 2.5 years ago. He had presented to ED at 189.2 lbs.   There is no direct mention of the impact his diarrhea has had on diet, though on MST pt did report decreased intake due to lack of appetite.    There is documented meal records since arrival. His diet has just been advanced past CL as he was prepping for colonoscopy. Colonoscopy showed ulcerative proctocolitis. He was started on steroids and mesalamine. He was recommended to follow a low-fiber diet.   Given diarrhea and report of drastic weight loss, will order boost breeze and prostat, as these will likely be better tolerated than ensure. Would recommended probitioc- visbiome (formulation-formerly known as VSL #3) or lactobacillus reuteri, as these probiotics have shown some efficacy in inducing and maintaining remission in UC. Though may not be appropriate if UC deemed to severe in degree. Given he is starting steroids, would also recommend checking Vit D given impact on bone health.   Meds: Demerol, zofran, Versed, mesalamine, predinsone, ppi Labs: Hgb: 6.9->7.4, Albumin: 2.5,   Recent Labs  Lab 08/15/18 1314 08/15/18 1504 08/16/18 0558  NA 134*  --  138  K 3.2*  --  3.7  CL 102  --  106  CO2 22  --  24  BUN 13  --  11  CREATININE 1.35*  --  1.01  CALCIUM 7.9*  --  7.8*  MG  --  2.0  --   GLUCOSE 108*  --  89   NUTRITION - FOCUSED PHYSICAL EXAM: Unable to conduct  Diet Order:   Diet Order            Diet Heart Room service appropriate? Yes; Fluid consistency: Thin  Diet effective now             EDUCATION NEEDS:  Not  appropriate for education at this time  Skin:  Skin Assessment: Reviewed RN Assessment  Last BM:  5/27  Height:  Ht Readings from Last 1 Encounters:  08/15/18 5' 10"  (1.778 m)   Weight:  Wt Readings from Last 1 Encounters:  08/15/18 88.5 kg   Wt Readings from Last 10 Encounters:  08/15/18 88.5 kg  03/24/16 95.4 kg  08/20/12 104.3 kg   Ideal Body Weight:  75.45 kg  BMI:  Body mass index is 27.99 kg/m.  Estimated Nutritional Needs:  Kcal:  2000-2150 kcals (23-25 kcal/kg bw) Protein:  90-105g Pro (1.2-1.4g/kg bw) Fluid:  2.-2.2L fluid ( 38m/kcal)  NBurtis JunesRD, LDN,  CNSC Clinical Nutrition Available Tues-Sat via Pager: [phone]/27/2020 5:17 PM

## 2018-08-16 NOTE — Progress Notes (Signed)
Patient Demographics:    Brett Bright, is a 58 y.o. male, DOB - 07-06-1960, ZDG:[id number]  Admit date - 08/15/2018   Admitting Physician Orson Eva, MD  Outpatient Primary MD for the patient is Patient, No Pcp Per  LOS - 1   Chief Complaint  Patient presents with   Rectal Bleeding        Subjective:    Brett Bright today has no fevers, no emesis,  No chest pain, ongoing concerns about bloody stools and hematochezia  Assessment  & Plan :    Active Problems:   Acute GI bleeding   Symptomatic anemia   AKI (acute kidney injury) ([place])   Thrombocytosis ([facility])   Iron deficiency anemia due to chronic blood loss  Brief summary 58 y.o. 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 Need to monitor closely and transfuse as clinically indicated  3)AKI--- admission creatinine was 1.35 most likely due to dehydration and prerenal azotemia in the setting of acute GI blood loss, creatinine is down to 1.0 baseline usually around 0.7.Marland KitchenMarland Kitchen Avoid nephrotoxic agents  Disposition/Need for in-Hospital Stay- patient unable to be discharged at this time due to hematochezia requiring transfusion, AKI requiring IV fluids possible discharge in 1 to 2 days if tolerating oral intake and no further significant drop in H&H or  significant GI bleed  Code Status : Full  Family Communication:   Na  Procedure:- Colonoscopy 08/16/2018----   -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 Diverticulosis  Disposition Plan  : home  Consults  :  Gi  DVT Prophylaxis  :  - SCDs  Lab Results  Component Value Date   PLT 332 08/16/2018    Inpatient Medications  Scheduled Meds:  feeding supplement  1 Container Oral BID BM   feeding supplement (PRO-STAT SUGAR FREE 64)  30 mL Oral BID   lidocaine       meperidine       mesalamine  4.8 g Oral Q breakfast   midazolam       ondansetron       predniSONE  40 mg Oral Q breakfast   Continuous Infusions:  0.9 % NaCl with KCl 20 mEq / L 50 mL/hr at 08/16/18 0200   pantoprozole (PROTONIX) infusion 8 mg/hr (08/16/18 1204)   PRN Meds:.acetaminophen **OR** acetaminophen, ondansetron **OR** ondansetron (ZOFRAN) IV    Anti-infectives (From admission, onward)   None        Objective:   Vitals:   08/16/18 1505 08/16/18 1510 08/16/18 1515 08/16/18 1520  BP: (!) 96/58 93/62 (!) 95/55 105/64  Pulse: 91 91 92 90  Resp: 19 (!) 22 (!) 21 (!) 21  Temp:      TempSrc:      SpO2: 100% 100% 100%  100%  Weight:      Height:        Wt Readings from Last 3 Encounters:  08/15/18 88.5 kg  03/24/16 95.4 kg  08/20/12 104.3 kg     Intake/Output Summary (Last 24 hours) at 08/16/2018 1804 Last data filed at 08/16/2018 1500 Gross per 24 hour  Intake 2162.07 ml  Output --  Net 2162.07 ml     Physical Exam Patient is examined daily including today on 08/16/18 , exams remain the same as of yesterday except that has changed   Gen:- Awake Alert,  In no apparent distress  HEENT:- [place].AT, No sclera icterus Neck-Supple Neck,No JVD,.  Lungs-  CTAB , fair symmetrical air movement CV- S1, S2 normal, regular  Abd-  +ve B.Sounds, Abd Soft, No significant abdominal tenderness,    Extremity/Skin:- No  edema, pedal pulses present   Psych-affect is appropriate, oriented x3 Neuro-no new focal deficits, no tremors   Data Review:   Micro Results Recent Results (from the past 240 hour(s))  SARS Coronavirus 2 (CEPHEID - Performed in [place] hospital lab), Hosp Order     Status: None   Collection Time: 08/15/18  3:09 PM  Result Value Ref Range Status   SARS Coronavirus 2 NEGATIVE NEGATIVE Final    Comment: (NOTE) If result is NEGATIVE SARS-CoV-2 target nucleic acids are NOT DETECTED. The SARS-CoV-2 RNA is generally detectable in upper and lower  respiratory specimens during the acute phase of infection. The lowest  concentration of SARS-CoV-2 viral copies this assay can detect is 250  copies / mL. A negative result does not preclude SARS-CoV-2 infection  and should not be used as the sole basis for treatment or other  patient management decisions.  A negative result may occur with  improper specimen collection / handling, submission of specimen other  than nasopharyngeal swab, presence of viral mutation(s) within the  areas targeted by this assay, and inadequate number of viral copies  (<250 copies / mL). A negative result must be combined with clinical  observations, patient history, and epidemiological information. If result is POSITIVE SARS-CoV-2 target nucleic acids are DETECTED. The SARS-CoV-2 RNA is generally detectable in upper and lower  respiratory specimens dur ing the acute phase of infection.  Positive  results are indicative of active infection with SARS-CoV-2.  Clinical  correlation with patient history and other diagnostic information is  necessary to determine patient infection status.  Positive results do  not rule out bacterial infection or co-infection with other viruses. If result is PRESUMPTIVE POSTIVE SARS-CoV-2 nucleic acids MAY BE PRESENT.   A presumptive positive result was obtained on the submitted specimen  and confirmed on repeat testing.  While 2019 novel coronavirus   (SARS-CoV-2) nucleic acids may be present in the submitted sample  additional confirmatory testing may be necessary for epidemiological  and / or clinical management purposes  to differentiate between  SARS-CoV-2 and other Sarbecovirus currently known to infect humans.  If clinically indicated additional testing with an alternate test  methodology [phone]) is advised. The SARS-CoV-2 RNA is generally  detectable in upper and lower respiratory sp ecimens during the acute  phase of infection. The expected result is Negative. Fact Sheet for Patients:  StrictlyIdeas.no Fact Sheet for Healthcare Providers: BankingDealers.co.za This test is not yet approved or cleared by the [place] FDA and has been authorized for detection and/or diagnosis of SARS-CoV-2 by FDA under an Emergency Use Authorization (EUA).  This EUA will remain in effect (meaning this test  can be used) for the duration of the COVID-19 declaration under Section 564(b)(1) of the Act, 21 U.S.C. section 360bbb-3(b)(1), unless the authorization is terminated or revoked sooner. Performed at [facility], [address], [place], [place] [address]     Radiology Reports Dg Chest [place] 1 View  Result Date: 08/15/2018 CLINICAL DATA:  Epigastric abdominal pain. EXAM: PORTABLE CHEST 1 VIEW COMPARISON:  Radiographs of March 22, 2016. FINDINGS: The heart size and mediastinal contours are within normal limits. Both lungs are clear. No pneumothorax or pleural effusion is noted. The visualized skeletal structures are unremarkable. IMPRESSION: No active disease. Electronically Signed   By: Marijo Conception M.D.   On: 08/15/2018 13:29     CBC Recent Labs  Lab 08/15/18 1314 08/15/18 1920 08/16/18 0558  WBC 8.3  --  6.0  HGB 8.1* 6.9* 7.4*  HCT 27.3* 23.6* 26.2*  PLT 436*  --  332  MCV 68.8*  --  72.8*  MCH 20.4*  --  20.6*  MCHC 29.7*  --  28.2*  RDW 18.2*  --  19.0*     Chemistries  Recent Labs  Lab 08/15/18 1314 08/15/18 1504 08/16/18 0558  NA 134*  --  138  K 3.2*  --  3.7  CL 102  --  106  CO2 22  --  24  GLUCOSE 108*  --  89  BUN 13  --  11  CREATININE 1.35*  --  1.01  CALCIUM 7.9*  --  7.8*  MG  --  2.0  --   AST 20  --   --   ALT 16  --   --   ALKPHOS 64  --   --   BILITOT 0.6  --   --    ------------------------------------------------------------------------------------------------------------------ No results for input(s): CHOL, HDL, LDLCALC, TRIG, CHOLHDL, LDLDIRECT in the last 72 hours.  No results found for: HGBA1C ------------------------------------------------------------------------------------------------------------------ No results for input(s): TSH, T4TOTAL, T3FREE, THYROIDAB in the last 72 hours.  Invalid input(s): FREET3 ------------------------------------------------------------------------------------------------------------------ Recent Labs    08/15/18 1512  FERRITIN 5*  TIBC 221*  IRON 5*    Coagulation profile Recent Labs  Lab 08/15/18 1504  INR 1.2    No results for input(s): DDIMER in the last 72 hours.  Cardiac Enzymes No results for input(s): CKMB, TROPONINI, MYOGLOBIN in the last 168 hours.  Invalid input(s): CK ------------------------------------------------------------------------------------------------------------------ No results found for: BNP   Roxan Hockey M.D on 08/16/2018 at 6:04 PM  Go to [email or web address] - for contact info  [facility] - Office  [phone]

## 2018-08-16 NOTE — Op Note (Signed)
[facility] Patient Name: Brett Bright Procedure Date: 08/16/2018 2:00 PM MRN: [id number] Date of Birth: [birth year] Attending MD: Norvel Richards , MD CSN: [id number] Age: 58 Admit Type: Outpatient Procedure:                Colonoscopy Indications:              Hematochezia Providers:                Norvel Richards, MD, Charlsie Quest. Theda Sers RN, RN,                            Raphael Gibney, Technician, Aram Candela Referring MD:              Medicines:                Midazolam 6 mg IV, Meperidine 40 mg IV, Complications:            No immediate complications. Estimated Blood Loss:     Estimated blood loss was minimal. Procedure:                Pre-Anesthesia Assessment:                           - Prior to the procedure, a History and Physical                            was performed, and patient medications and                            allergies were reviewed. The patient's tolerance of                            previous anesthesia was also reviewed. The risks                            and benefits of the procedure and the sedation                            options and risks were discussed with the patient.                            All questions were answered, and informed consent                            was obtained. Prior Anticoagulants: The patient has                            taken no previous anticoagulant or antiplatelet                            agents. ASA Grade Assessment: III - A patient with                            severe systemic disease. After reviewing the risks  and benefits, the patient was deemed in                            satisfactory condition to undergo the procedure.                           After obtaining informed consent, the colonoscope                            was passed under direct vision. Throughout the                            procedure, the patient's blood pressure, pulse, and                 oxygen saturations were monitored continuously. The                            CF-[id number] ([id number]) scope was introduced through                            the anus and advanced to the 5 cm into the ileum.                            The colonoscopy was performed without difficulty.                            The patient tolerated the procedure well. The                            quality of the bowel preparation was adequate. Scope In: 2:53:43 PM Scope Out: 3:13:29 PM Scope Withdrawal Time: 0 hours 16 minutes 27 seconds  Total Procedure Duration: 0 hours 19 minutes 46 seconds  Findings:      The perianal and digital rectal examinations were normal.      Abnormal rectum with granularity, friability,edema and complete loss of       the normal vascular pattern involving the rectum diffusely extending       confluently up to the rectosigmoid junction and on around to within a       few centimeters distal to the ileocecal valve. The ileocecal valve and       cecum appeared to be spared and appeared normal. The patient did have       scattered left-sided diverticula. The distal 5 cm of TI appeared normal.       Segmental biopsies taken. 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                            Diverticulosis Moderate Sedation:      Moderate (conscious) sedation was administered by the endoscopy nurse       and supervised by the endoscopist. The following parameters were  monitored: oxygen saturation, heart rate, blood pressure, respiratory       rate, EKG, adequacy of pulmonary ventilation, and response to care.       Total physician intraservice time was 27 minutes. Recommendation:           - Patient has a contact number available for                            emergencies. The signs and symptoms of potential                            delayed complications were  discussed with the                            patient. Return to normal activities tomorrow.                            Written discharge instructions were provided to the                            patient.                           - Return patient to hospital ward for ongoing care.                           - Low fiber diet today. Begin prednisone 40 mg                            daily. Begin Lialda 4.8 g daily. Avoid nonsteroidal                            agents as much as possible moving forward.                            Follow-up on biopsies. At patient's request, I                            discussed my findings and recommendations with his                            wife, Brett Bright at [phone] Procedure Code(s):        --- Professional ---                           [phone], Colonoscopy, flexible; diagnostic, including                            collection of specimen(s) by brushing or washing,                            when performed (separate procedure)                           99153, Moderate sedation; each additional 15  minutes intraservice time                           G0500, Moderate sedation services provided by the                            same physician or other qualified health care                            professional performing a gastrointestinal                            endoscopic service that sedation supports,                            requiring the presence of an independent trained                            observer to assist in the monitoring of the                            patient's level of consciousness and physiological                            status; initial 15 minutes of intra-service time;                            patient age 64 years or older (additional time may                            be reported with [phone], as appropriate) Diagnosis Code(s):        --- Professional ---                            K92.1, Melena (includes Hematochezia) CPT copyright 2019 American Medical Association. All rights reserved. The codes documented in this report are preliminary and upon coder review may  be revised to meet current compliance requirements. Cristopher Estimable. Kayce Betty, MD Norvel Richards, MD 08/16/2018 3:35:52 PM This report has been signed electronically. Number of Addenda: 0

## 2018-08-16 NOTE — Consult Note (Signed)
Referring Provider: Roxan Hockey, MD Primary Care Physician:  Patient, No Pcp Per Primary Gastroenterologist:  Barney Drain, MD  Reason for Consultation:  Gi bleed  HPI: Brett Bright is a 58 y.o. male presents to the ED with complaints of bloody diarrhea.  Patient last seen by our practice in January 2018 during hospitalization at which time there was concern for sigmoid colon mass, gallbladder mass via CT.  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  Ultimately underwent cholecystectomy at [facility] with no evidence of malignancy per patient.  Colonoscopy in 2018 at that time showed thrombosed external hemorrhoids, internal hemorrhoids, diverticulosis, segmental colitis of the sigmoid colon but no mass.  Biopsies showing chronic colitis but benign.  Recommended to have a 5-year follow-up colonoscopy due to poor prep in the right colon.  EGD at that time also showed gastritis, no H. pylori, multiple small sessile polyps nonbleeding, mild duodenitis.  Patient called yesterday complaining of several week history of diarrhea, 6-8 times daily, bright red blood noted most BMs.  Nocturnal diarrhea.  Lost 12 to 14 pounds in a few weeks.  He was advised to go to the emergency department for evaluation.  Prior to onset of diarrhea for about 1 week he had increased reflux symptoms.  He had been on iron about 3 times per week and stopped at that time.  He started Prilosec at.  He had a couple episodes of vomiting associated with the symptoms.  These now have resolved.  No dysphagia.  No abdominal pain.  No melena.  Denies any NSAID or aspirin use.    In the ED potassium was 3.2, creatinine 1.35 hemoglobin 8.1, hematocrit 27.3, MCV 68.8, platelets 436,000, white blood cell count normal.  INR 1.2.  Iron 5, TIBC 221, iron saturations 2%, ferritin 5.  He has completed 1 unit of packed red blood cells, hemoglobin up to 7.4.  Notably in January 2018 his hemoglobin was 8.8.  Iron deficiency anemia at  that time as well.   Prior to Admission medications   Medication Sig Start Date End Date Taking? Authorizing Provider  acetaminophen (TYLENOL) 325 MG tablet Take 2 tablets (650 mg total) by mouth every 6 (six) hours as needed for mild pain (or Fever >/= 101). 03/24/16  Yes Hosie Poisson, MD  omeprazole (PRILOSEC) 20 MG capsule Take 20 mg by mouth daily.   Yes [provider]    Current Facility-Administered Medications  Medication Dose Route Frequency Provider Last Rate Last Dose  . 0.9 % NaCl with KCl 20 mEq/ L  infusion   Intravenous Continuous Tat, David, MD 50 mL/hr at 08/16/18 0200    . acetaminophen (TYLENOL) tablet 650 mg  650 mg Oral Q6H PRN Tat, Shanon Brow, MD       Or  . acetaminophen (TYLENOL) suppository 650 mg  650 mg Rectal Q6H PRN Tat, Shanon Brow, MD      . ondansetron (ZOFRAN) tablet 4 mg  4 mg Oral Q6H PRN Tat, Shanon Brow, MD       Or  . ondansetron (ZOFRAN) injection 4 mg  4 mg Intravenous Q6H PRN Tat, Shanon Brow, MD      . pantoprazole (PROTONIX) 80 mg in sodium chloride 0.9 % 250 mL (0.32 mg/mL) infusion  8 mg/hr Intravenous Continuous Tat, David, MD 25 mL/hr at 08/16/18 0200 8 mg/hr at 08/16/18 0200    Allergies as of 08/15/2018  . (No Known Allergies)    Past Medical History:  Diagnosis Date  . Medical history  non-contributory     Past Surgical History:  Procedure Laterality Date  . APPENDECTOMY    . CHOLECYSTECTOMY    . COLONOSCOPY N/A 03/23/2016   Dr. Oneida Alar: Thrombosed external hemorrhoid, internal hemorrhoids, diverticulosis, segmental moderate inflammation sigmoid colon secondary due to colitis with benign biopsies.  Next colonoscopy 5 years due to poor prep right colon.  . ESOPHAGOGASTRODUODENOSCOPY N/A 03/23/2016   Dr. Oneida Alar: Gastritis, no H. pylori, multiple small sessile polyps biopsy consistent with fundic gland, mild duodenitis.  Marland Kitchen VASECTOMY      Family History  Problem Relation Age of Onset  . Stroke Mother   . Cancer Maternal Grandfather        Unknown  type of cancer  . Heart attack Paternal Grandmother   . Cancer Maternal Aunt        Possibly lung cancer  . Cancer Paternal Uncle        Unknown type of cancer  . Cancer Paternal Uncle        Unknown type of cancer  . Colon cancer Neg Hx     Social History   Socioeconomic History  . Marital status: Married    Spouse name: Not on file  . Number of children: 3  . Years of education: Not on file  . Highest education level: Not on file  Occupational History  . Occupation: Primary school teacher    Comment: At [employer]  . Financial resource strain: Not on file  . Food insecurity:    Worry: Not on file    Inability: Not on file  . Transportation needs:    Medical: Not on file    Non-medical: Not on file  Tobacco Use  . Smoking status: Never Smoker  . Smokeless tobacco: Never Used  Substance and Sexual Activity  . Alcohol use: No  . Drug use: No  . Sexual activity: Not on file  Lifestyle  . Physical activity:    Days per week: Not on file    Minutes per session: Not on file  . Stress: Not on file  Relationships  . Social connections:    Talks on phone: Not on file    Gets together: Not on file    Attends religious service: Not on file    Active member of club or organization: Not on file    Attends meetings of clubs or organizations: Not on file    Relationship status: Not on file  . Intimate partner violence:    Fear of current or ex partner: Not on file    Emotionally abused: Not on file    Physically abused: Not on file    Forced sexual activity: Not on file  Other Topics Concern  . 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   He is married and has 3 children, who are ages 93, 51, and 32 as of 2017.   The 60 and 46 year old children are still living at home, but the 57 yo is married and lives elsewhere.   The patient lives in [place], [place].      His primary care is [facility] in [place], [place].     ROS:  General: Negative for anorexia, fever, chills.  Positive weight loss,positive fatigue, positive weakness. Eyes: Negative for vision changes.  ENT: Negative for hoarseness, difficulty swallowing , nasal congestion. CV: Negative for chest pain, angina, palpitations,  dyspnea on exertion, peripheral edema.  Respiratory: Negative for dyspnea at rest, dyspnea on exertion, cough, sputum, wheezing.  GI: See history of present illness. GU:  Negative for dysuria, hematuria, urinary incontinence, urinary frequency, nocturnal urination.  MS: Negative for joint pain, low back pain.  Derm: Negative for rash or itching.  Neuro: Negative for weakness, abnormal sensation, seizure, frequent headaches, memory loss, confusion.  Psych: Negative for anxiety, depression, suicidal ideation, hallucinations.  Endo: Negative for unusual weight change.  Heme: Negative for bruising or bleeding. Allergy: Negative for rash or hives.       Physical Examination: Vital signs in last 24 hours: Temp:  [98.1 F (36.7 C)-99.5 F (37.5 C)] 98.1 F (36.7 C) (05/27 0550) Pulse Rate:  [91-117] 91 (05/27 0550) Resp:  [11-21] 16 (05/27 0550) BP: (101-131)/(63-90) 113/68 (05/27 0550) SpO2:  [96 %-100 %] 100 % (05/27 0550) Weight:  [85.8 kg-88.5 kg] 88.5 kg (05/26 1704) Last BM Date: 08/16/18  General: Well-nourished, well-developed in no acute distress.  Head: Normocephalic, atraumatic.   Eyes: Conjunctiva pink, no icterus. Mouth: Oropharyngeal mucosa moist and pink , no lesions erythema or exudate. Neck: Supple without thyromegaly, masses, or lymphadenopathy.  Lungs: Clear to auscultation bilaterally.  Heart: Regular rate and rhythm, no murmurs rubs or gallops.  Abdomen: Bowel sounds are normal, nontender, nondistended, no hepatosplenomegaly or masses, no abdominal bruits or    hernia , no rebound or guarding.   Rectal: Not performed Extremities: No lower extremity edema,  clubbing, deformity.  Neuro: Alert and oriented x 4 , grossly normal neurologically.  Skin: Warm and dry, no rash or jaundice.   Psych: Alert and cooperative, normal mood and affect.        Intake/Output from previous day: 05/26 0701 - 05/27 0700 In: 2703.2 [I.V.:931.3; Blood:314; IV Piggyback:1457.9] Out: -  Intake/Output this shift: No intake/output data recorded.  Lab Results: CBC Recent Labs    08/15/18 1314 08/15/18 1920 08/16/18 0558  WBC 8.3  --  6.0  HGB 8.1* 6.9* 7.4*  HCT 27.3* 23.6* 26.2*  MCV 68.8*  --  72.8*  PLT 436*  --  332   BMET Recent Labs    08/15/18 1314 08/16/18 0558  NA 134* 138  K 3.2* 3.7  CL 102 106  CO2 22 24  GLUCOSE 108* 89  BUN 13 11  CREATININE 1.35* 1.01  CALCIUM 7.9* 7.8*   LFT Recent Labs    08/15/18 1314  BILITOT 0.6  ALKPHOS 64  AST 20  ALT 16  PROT 7.4  ALBUMIN 2.5*    Lipase Recent Labs    08/15/18 1314  LIPASE 35    PT/INR Recent Labs    08/15/18 1504  LABPROT 14.8  INR 1.2      Imaging Studies: Dg Chest Port 1 View  Result Date: 08/15/2018 CLINICAL DATA:  Epigastric abdominal pain. EXAM: PORTABLE CHEST 1 VIEW COMPARISON:  Radiographs of March 22, 2016. FINDINGS: The heart size and mediastinal contours are within normal limits. Both lungs are clear. No pneumothorax or pleural effusion is noted. The visualized skeletal structures are unremarkable. IMPRESSION: No active disease. Electronically Signed   By: Marijo Conception M.D.   On: 08/15/2018 13:29  [4 week]   Impression: Pleasant 58 year old gentleman presenting with approximately 2-week history of initially upper GI symptoms associated with reflux, vomiting but now with 10 pound weight loss, diarrhea associated blood per rectum.  Complains of nocturnal diarrhea.  No recent antibiotic use or ill contacts.  No fever.  In 2018 he had segmental colitis involving the sigmoid colon, biopsies benign.  Clinically had been doing well up until recently.   Interestingly however he is noted to have profound iron deficiency anemia.  Because of this, would be concerned about other etiologies outside of simple infectious etiology.  Differential diagnosis includes IBD, underlying malignancy.  We will screen for celiac disease with serologies this time.  Plan: 1. Began prep today for colonoscopy with possible upper endoscopy based on findings.  I have discussed the risks, alternatives, benefits with regards to but not limited to the risk of reaction to medication, bleeding, infection, perforation and the patient is agreeable to proceed. Written consent to be obtained. 2. Celiac serologies.  We would like to thank you for the opportunity to participate in the care of [place].  Laureen Ochs. Bernarda Caffey [facility] [phone] 5/27/20209:38 AM     LOS: 1 day

## 2018-08-17 ENCOUNTER — Encounter (HOSPITAL_COMMUNITY): Payer: Self-pay | Admitting: Internal Medicine

## 2018-08-17 ENCOUNTER — Other Ambulatory Visit: Payer: Self-pay

## 2018-08-17 ENCOUNTER — Encounter: Payer: Self-pay | Admitting: Gastroenterology

## 2018-08-17 ENCOUNTER — Telehealth: Payer: Self-pay | Admitting: Gastroenterology

## 2018-08-17 DIAGNOSIS — K5100 Ulcerative (chronic) pancolitis without complications: Secondary | ICD-10-CM

## 2018-08-17 DIAGNOSIS — K51011 Ulcerative (chronic) pancolitis with rectal bleeding: Principal | ICD-10-CM

## 2018-08-17 LAB — CBC
HCT: 24 % — ABNORMAL LOW (ref 39.0–52.0)
Hemoglobin: 7 g/dL — ABNORMAL LOW (ref 13.0–17.0)
MCH: 21.3 pg — ABNORMAL LOW (ref 26.0–34.0)
MCHC: 29.2 g/dL — ABNORMAL LOW (ref 30.0–36.0)
MCV: 72.9 fL — ABNORMAL LOW (ref 80.0–100.0)
Platelets: 292 10*3/uL (ref 150–400)
RBC: 3.29 MIL/uL — ABNORMAL LOW (ref 4.22–5.81)
RDW: 19 % — ABNORMAL HIGH (ref 11.5–15.5)
WBC: 5.1 10*3/uL (ref 4.0–10.5)
nRBC: 0 % (ref 0.0–0.2)

## 2018-08-17 LAB — HEMOGLOBIN AND HEMATOCRIT, BLOOD
HCT: 24.7 % — ABNORMAL LOW (ref 39.0–52.0)
Hemoglobin: 7 g/dL — ABNORMAL LOW (ref 13.0–17.0)

## 2018-08-17 LAB — IGA: IgA: 527 mg/dL — ABNORMAL HIGH (ref 90–386)

## 2018-08-17 LAB — TISSUE TRANSGLUTAMINASE, IGA: Tissue Transglutaminase Ab, IgA: <2 U/mL (ref 0–3)

## 2018-08-17 MED ORDER — PANTOPRAZOLE SODIUM 40 MG PO TBEC: 40.0000 mg | DELAYED_RELEASE_TABLET | Freq: Every day | ORAL | Status: DC

## 2018-08-17 MED ORDER — FERROUS SULFATE 325 (65 FE) MG PO TBEC: 325.0000 mg | DELAYED_RELEASE_TABLET | Freq: Two times a day (BID) | ORAL | 3 refills | Status: DC

## 2018-08-17 MED ORDER — PREDNISONE 10 MG PO TABS: 10.0000 mg | ORAL_TABLET | ORAL | 0 refills | Status: DC

## 2018-08-17 MED ORDER — MESALAMINE 1.2 G PO TBEC: 4.8000 g | DELAYED_RELEASE_TABLET | Freq: Every day | ORAL | 2 refills | Status: DC

## 2018-08-17 NOTE — Discharge Summary (Signed)
Brett Bright, is a 58 y.o. male  DOB 01-22-1961  MRN [id number].  Admission date:  08/15/2018  Admitting Physician  Orson Eva, MD  Discharge Date:  08/17/2018   Primary MD  Patient, No Pcp Per  Recommendations for primary care physician for things to follow:  1)Take Prednisone 40 mg (10 mg x 4) daily with food for 7 days, then 30 mg daily x 7 days then 20 mg daily x  7 days then 10 mg daily x 7 days 2) call if any bright red blood per rectum/Rectal bleeding 3)Avoid ibuprofen/Advil/Aleve/Motrin/Goody Powders/Naproxen/BC powders/Meloxicam/Diclofenac/Indomethacin and other Nonsteroidal anti-inflammatory medications as these will make you more likely to bleed and can cause stomach ulcers, can also cause Kidney problems.  4) follow-up with gastroenterologist in 1 to 2 weeks for repeat CBC/complete blood count   Admission Diagnosis  Epigastric pain [R10.13] Acute GI bleeding [K92.2] Symptomatic anemia [D64.9]  Discharge Diagnosis  Epigastric pain [R10.13] Acute GI bleeding [K92.2] Symptomatic anemia [D64.9]    Active Problems:   Acute GI bleeding   Symptomatic anemia   AKI (acute kidney injury) ([place])   Thrombocytosis (HCC)   Iron deficiency anemia due to chronic blood loss   Ulcerative pancolitis with rectal bleeding [facility])      Past Medical History:  Diagnosis Date  . Medical history non-contributory     Past Surgical History:  Procedure Laterality Date  . APPENDECTOMY    . BIOPSY  08/16/2018   Procedure: BIOPSY;  Surgeon: Daneil Dolin, MD;  [facility];  Service: Endoscopy;;  . CHOLECYSTECTOMY    . COLONOSCOPY N/A 03/23/2016   Dr. Oneida Alar: Thrombosed external hemorrhoid, internal hemorrhoids, diverticulosis, segmental moderate inflammation sigmoid colon secondary due to colitis with benign biopsies.  Next colonoscopy 5 years due to poor prep right colon.  . COLONOSCOPY N/A 08/16/2018    Procedure: COLONOSCOPY;  Surgeon: Daneil Dolin, MD;  Location: [facility];  Service: Endoscopy;  Laterality: N/A;  . ESOPHAGOGASTRODUODENOSCOPY N/A 03/23/2016   Dr. Oneida Alar: Gastritis, no H. pylori, multiple small sessile polyps biopsy consistent with fundic gland, mild duodenitis.  Marland Kitchen ESOPHAGOGASTRODUODENOSCOPY N/A 08/16/2018   Procedure: ESOPHAGOGASTRODUODENOSCOPY (EGD);  Surgeon: Daneil Dolin, MD;  Location[facility];  Service: Endoscopy;  Laterality: N/A;  . VASECTOMY       HPI  from the history and physical done on the day of admission:    HPI:  Brett Bright is a 58 y.o. male with medical history of chronic cholecystitis status post cholecystectomy January 2018 and no other documented chronic medical problems presenting with loose stools and hematochezia for the past 10 days.  The patient denies any recent travels or eating any unusual or exotic foods.  He denies any recent antibiotics.  He has had small-volume hematochezia associated with the loose stools.  He is also had some intermittent nausea and vomiting for the past 10 days without any hematemesis.  He denies any NSAID use.  He denies any other over-the-counter medication use.  He does not drink  any alcohol or smoke any cigarettes.  He denies any illegal drug use.  The patient denies any abdominal pain but complains of some intermittent "sour stomach" after eating heavy meals.  He denies any fevers, chills, chest pain, shortness breath, dysuria, hematuria.  He has had some dizziness.  The patient contacted Dr. Barney Drain today, and he was advised to go to the emergency department for further evaluation. In the emergency department, the patient was afebrile hemodynamically stable although he had some mild tachycardia in the 110s.  Oxygen saturation was 96% room air.  Hemoglobin was 8.1.  BMP showed a potassium of 3.2 with serum creatinine 1.35.    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 Diverticulosis  Disposition Plan  : home  Consults  :  Gi  Discharge Condition: stable  Follow UP--- Dr. Gala Romney from GI service  Diet and Activity recommendation:  As advised  Discharge Instructions    Discharge Instructions    Call MD for:  difficulty breathing, headache or visual disturbances   Complete by:  As directed    Call MD for:  persistant dizziness or light-headedness   Complete by:  As directed    Call MD for:  persistant nausea and vomiting   Complete by:  As directed    Call MD for:  severe uncontrolled pain   Complete by:  As directed    Call MD for:  temperature >100.4   Complete by:  As directed    Diet - low sodium heart healthy   Complete by:  As directed    Discharge instructions   Complete by:  As directed    1)Take Prednisone 40 mg (10 mg x 4)  daily with food for 7 days, then 30 mg daily x 7 days then 20 mg daily x  7 days then 10 mg daily x 7 days 2) call if any bright red blood per rectum/Rectal bleeding 3)Avoid ibuprofen/Advil/Aleve/Motrin/Goody Powders/Naproxen/BC powders/Meloxicam/Diclofenac/Indomethacin and other Nonsteroidal anti-inflammatory medications as these will make you more likely to bleed and can cause stomach ulcers, can also cause Kidney problems.  4) follow-up with gastroenterologist in 1 to 2 weeks for repeat CBC/complete blood count   Increase activity slowly   Complete by:  As directed       Discharge Medications     Allergies as of 08/17/2018   No Known Allergies     Medication List    TAKE these medications   acetaminophen 325 MG tablet Commonly known as:  TYLENOL Take 2 tablets (650 mg  total) by mouth every 6 (six) hours as needed for mild pain (or Fever >/= 101).   ferrous sulfate 325 (65 FE) MG EC tablet Take 1 tablet (325 mg total) by mouth 2 (two) times daily with a meal.   mesalamine 1.2 g EC tablet Commonly known as:  LIALDA Take 4 tablets (4.8 g total) by mouth daily with breakfast. Start taking on:  Aug 18, 2018   omeprazole 20 MG capsule Commonly known as:  PRILOSEC Take 20 mg by mouth daily.   predniSONE 10 MG tablet Commonly known as:  DELTASONE Take 1 tablet (10 mg total) by mouth See admin instructions. Take 40 mg (10 mg x 4) daily with food for 7 days, then 30 mg daily x 7 days then 20 mg daily x  7 days then 10 mg daily x 7 days       Major procedures and Radiology Reports - PLEASE review detailed and final reports for all details, in brief -   Dg Chest Port 1 View  Result Date: 08/15/2018 CLINICAL DATA:  Epigastric abdominal pain. EXAM: PORTABLE CHEST 1 VIEW COMPARISON:  Radiographs of March 22, 2016. FINDINGS: The heart size and mediastinal contours are within normal limits. Both lungs are clear. No pneumothorax or pleural effusion is noted. The visualized skeletal  structures are unremarkable. IMPRESSION: No active disease. Electronically Signed   By: Marijo Conception M.D.   On: 08/15/2018 13:29    Micro Results   Recent Results (from the past 240 hour(s))  SARS Coronavirus 2 (CEPHEID - Performed in [place] hospital lab), Hosp Order     Status: None   Collection Time: 08/15/18  3:09 PM  Result Value Ref Range Status   SARS Coronavirus 2 NEGATIVE NEGATIVE Final    Comment: (NOTE) If result is NEGATIVE SARS-CoV-2 target nucleic acids are NOT DETECTED. The SARS-CoV-2 RNA is generally detectable in upper and lower  respiratory specimens during the acute phase of infection. The lowest  concentration of SARS-CoV-2 viral copies this assay can detect is 250  copies / mL. A negative result does not preclude SARS-CoV-2 infection  and should not be used as the sole basis for treatment or other  patient management decisions.  A negative result may occur with  improper specimen collection / handling, submission of specimen other  than nasopharyngeal swab, presence of viral mutation(s) within the  areas targeted by this assay, and inadequate number of viral copies  (<250 copies / mL). A negative result must be combined with clinical  observations, patient history, and epidemiological information. If result is POSITIVE SARS-CoV-2 target nucleic acids are DETECTED. The SARS-CoV-2 RNA is generally detectable in upper and lower  respiratory specimens dur ing the acute phase of infection.  Positive  results are indicative of active infection with SARS-CoV-2.  Clinical  correlation with patient history and other diagnostic information is  necessary to determine patient infection status.  Positive results do  not rule out bacterial infection or co-infection with other viruses. If result is PRESUMPTIVE POSTIVE SARS-CoV-2 nucleic acids MAY BE PRESENT.   A presumptive positive result was obtained on the submitted specimen  and confirmed on repeat testing.  While  2019 novel coronavirus  (SARS-CoV-2) nucleic acids may be present in the submitted sample  additional confirmatory testing may be necessary for epidemiological  and / or clinical management purposes  to differentiate between  SARS-CoV-2 and other Sarbecovirus currently known to infect humans.  If clinically indicated additional testing with an alternate  test  methodology [phone]) is advised. The SARS-CoV-2 RNA is generally  detectable in upper and lower respiratory sp ecimens during the acute  phase of infection. The expected result is Negative. Fact Sheet for Patients:  StrictlyIdeas.no Fact Sheet for Healthcare Providers: BankingDealers.co.za This test is not yet approved or cleared by the [place] FDA and has been authorized for detection and/or diagnosis of SARS-CoV-2 by FDA under an Emergency Use Authorization (EUA).  This EUA will remain in effect (meaning this test can be used) for the duration of the COVID-19 declaration under Section 564(b)(1) of the Act, 21 U.S.C. section 360bbb-3(b)(1), unless the authorization is terminated or revoked sooner. Performed at [facility], [address], [place], [place] [address]     Today   Subjective    Brett Bright today has no new concerns, patient had brown BM with no blood, eating and drinking okay, no abdominal pain, no vomiting,          Patient has been seen and examined prior to discharge   Objective   Blood pressure 106/63, pulse (!) 102, temperature 98.8 F (37.1 C), temperature source Oral, resp. rate 18, height 5' 10"  (1.778 m), weight 88.5 kg, SpO2 98 %.   Intake/Output Summary (Last 24 hours) at 08/17/2018 1655 Last data filed at 08/17/2018 1534 Gross per 24 hour  Intake 905 ml  Output -  Net 905 ml    Exam Gen:- Awake Alert,  In no apparent distress  HEENT:- .AT, No sclera icterus Neck-Supple Neck,No JVD,.  Lungs-  CTAB , fair symmetrical air movement  CV- S1, S2 normal, regular  Abd-  +ve B.Sounds, Abd Soft, No significant abdominal tenderness,    Extremity/Skin:- No  edema, pedal pulses present  Psych-affect is appropriate, oriented x3 Neuro-no new focal deficits, no tremors   Data Review   CBC w Diff:  Lab Results  Component Value Date   WBC 5.1 08/17/2018   HGB 7.0 (L) 08/17/2018   HCT 24.7 (L) 08/17/2018   HCT 25.8 (L) 03/22/2016   PLT 292 08/17/2018   LYMPHOPCT 5 03/22/2016   MONOPCT 5 03/22/2016   EOSPCT 0 03/22/2016   BASOPCT 0 03/22/2016    CMP:  Lab Results  Component Value Date   NA 138 08/16/2018   K 3.7 08/16/2018   CL 106 08/16/2018   CO2 24 08/16/2018   BUN 11 08/16/2018   CREATININE 1.01 08/16/2018   PROT 7.4 08/15/2018   ALBUMIN 2.5 (L) 08/15/2018   BILITOT 0.6 08/15/2018   ALKPHOS 64 08/15/2018   AST 20 08/15/2018   ALT 16 08/15/2018   Total Discharge time is about 33 minutes  Roxan Hockey M.D on 08/17/2018 at 4:55 PM  Go to [email or web address] -  for contact info  [facility] - Office  [phone]

## 2018-08-17 NOTE — Telephone Encounter (Signed)
Lab orders placed and mailed to pt. Pt is aware of his appointment date and time.

## 2018-08-17 NOTE — Telephone Encounter (Signed)
Patient needs hospital follow-up for pan ulcerative colitis, in 3 to 4 weeks.    CBC prior to office visit.

## 2018-08-17 NOTE — Discharge Instructions (Signed)
1)Take Prednisone 40 mg (10 mg x 4) daily with food for 7 days, then 30 mg daily x 7 days then 20 mg daily x  7 days then 10 mg daily x 7 days 2) call if any bright red blood per rectum/Rectal bleeding 3)Avoid ibuprofen/Advil/Aleve/Motrin/Goody Powders/Naproxen/BC powders/Meloxicam/Diclofenac/Indomethacin and other Nonsteroidal anti-inflammatory medications as these will make you more likely to bleed and can cause stomach ulcers, can also cause Kidney problems.  4) follow-up with gastroenterologist in 1 to 2 weeks for repeat CBC/complete blood count

## 2018-08-17 NOTE — Progress Notes (Signed)
  Subjective:  Feeling a lot better.  Less blood noted.  Appetite improved this morning.  No vomiting.  Objective: Vital signs in last 24 hours: Temp:  [97.9 F (36.6 C)-98.9 F (37.2 C)] 98.4 F (36.9 C) (05/28 0511) Pulse Rate:  [85-102] 88 (05/28 0511) Resp:  [16-22] 16 (05/28 0511) BP: (82-111)/(46-70) 99/61 (05/28 0511) SpO2:  [93 %-100 %] 98 % (05/28 0809) Last BM Date: 08/16/18 General:   Alert,  Well-developed, well-nourished, pleasant and cooperative in NAD Head:  Normocephalic and atraumatic. Eyes:  Sclera clear, no icterus.  Abdomen:  Soft, nontender and nondistended. Neurologic:  Alert and  oriented x4;  grossly normal neurologically. Psych:  Alert and cooperative. Normal mood and affect.  Intake/Output from previous day: 05/27 0701 - 05/28 0700 In: 780 [P.O.:480; I.V.:300] Out: -  Intake/Output this shift: No intake/output data recorded.  Lab Results: CBC Recent Labs    08/15/18 1314 08/15/18 1920 08/16/18 0558  WBC 8.3  --  6.0  HGB 8.1* 6.9* 7.4*  HCT 27.3* 23.6* 26.2*  MCV 68.8*  --  72.8*  PLT 436*  --  332   BMET Recent Labs    08/15/18 1314 08/16/18 0558  NA 134* 138  K 3.2* 3.7  CL 102 106  CO2 22 24  GLUCOSE 108* 89  BUN 13 11  CREATININE 1.35* 1.01  CALCIUM 7.9* 7.8*   LFTs Recent Labs    08/15/18 1314  BILITOT 0.6  ALKPHOS 64  AST 20  ALT 16  PROT 7.4  ALBUMIN 2.5*   Recent Labs    08/15/18 1314  LIPASE 35   PT/INR Recent Labs    08/15/18 1504  LABPROT 14.8  INR 1.2      Imaging Studies: Dg Chest Port 1 View  Result Date: 08/15/2018 CLINICAL DATA:  Epigastric abdominal pain. EXAM: PORTABLE CHEST 1 VIEW COMPARISON:  Radiographs of March 22, 2016. FINDINGS: The heart size and mediastinal contours are within normal limits. Both lungs are clear. No pneumothorax or pleural effusion is noted. The visualized skeletal structures are unremarkable. IMPRESSION: No active disease. Electronically Signed   By: Marijo Conception  M.D.   On: 08/15/2018 13:29  [2 weeks]   Assessment: 58 year old gentleman presented with 2-week history of weight loss, vomiting, diarrhea with blood per rectum.  Noted to have profound iron deficiency anemia on evaluation.  Received 1 unit of packed red blood cells this admission.  Colonoscopy yesterday showed pan ulcerative proctocolitis likely representing idiopathic inflammatory bowel disease.  Plan: 1. Continue prednisone 40 mg daily with plans for slow taper over the next 4 weeks. 2. Lialda 4.8 g daily. 3. Avoid NSAIDs as much as possible. 4. Low fiber diet. 5. Follow-up on biopsies. 6. Ferrous sulfate 325 mg twice daily. 7. Switch pantoprazole to p.o. daily. 8. Close interval follow-up in our office.  Laureen Ochs. Bernarda Caffey [facility] [phone] 5/28/20209:10 AM     LOS: 2 days

## 2018-08-17 NOTE — Telephone Encounter (Addendum)
Brett Bright, please let patient know that we need him to complete labs in 2 weeks as his appt is scheduled out further than anticipated.

## 2018-08-17 NOTE — Telephone Encounter (Signed)
SCHEDULED AND LETTER SENT  °

## 2018-08-17 NOTE — Progress Notes (Signed)
IVs removed and discharge instructions reviewed.  Parked at [place] and will drive self home.  Scripts sent to pharmacy except hard script for prednisone

## 2018-08-17 NOTE — Progress Notes (Addendum)
Had bm this morning and no bright blood noted in toilet.  Has not complained of pain.  Tolerating diet.

## 2018-08-18 ENCOUNTER — Encounter: Payer: Self-pay | Admitting: Internal Medicine

## 2018-08-18 LAB — TYPE AND SCREEN
ABO/RH(D): B POS
Antibody Screen: NEGATIVE
Unit division: 0

## 2018-08-18 LAB — BPAM RBC
Blood Product Expiration Date: 202006252359
ISSUE DATE / TIME: 202005262142
Unit Type and Rh: 5100

## 2018-08-21 ENCOUNTER — Telehealth: Payer: Self-pay

## 2018-08-21 NOTE — Telephone Encounter (Signed)
Noted. Letter mailed.

## 2018-08-21 NOTE — Telephone Encounter (Signed)
Per RMR- Send letter to patient.  Send copy of letter with path to referring provider and PCP.  He should have appt wn next 4 weeks.  Send him info on UC please.

## 2018-08-21 NOTE — Telephone Encounter (Signed)
Noted, pt is aware 

## 2018-08-22 NOTE — Telephone Encounter (Signed)
OV made °

## 2018-09-11 ENCOUNTER — Telehealth: Payer: Self-pay

## 2018-09-11 NOTE — Telephone Encounter (Signed)
Brett Bright, please call pt. He has questions about taking Lialda since being discharged from hospital.

## 2018-09-11 NOTE — Telephone Encounter (Signed)
Pt was prescribed Lialda when he had his colonoscopy with RMR. Pt took his last pill this morning. Pt needs a refill and his next apt in our office is 09/2018.

## 2018-09-13 ENCOUNTER — Telehealth: Payer: Self-pay | Admitting: Gastroenterology

## 2018-09-13 ENCOUNTER — Encounter: Payer: Self-pay | Admitting: Gastroenterology

## 2018-09-13 MED ORDER — MESALAMINE 1.2 G PO TBEC: 4.8000 g | DELAYED_RELEASE_TABLET | Freq: Every day | ORAL | 11 refills | Status: DC

## 2018-09-13 NOTE — Telephone Encounter (Signed)
Pt called asking to speak with nurse. He has questions about his medication. Please call [phone]

## 2018-09-13 NOTE — Telephone Encounter (Signed)
See separate phone note, pt has called again.

## 2018-09-13 NOTE — Addendum Note (Signed)
Addended by: Danie Binder on: 09/13/2018 04:29 PM   Modules accepted: Orders

## 2018-09-13 NOTE — Telephone Encounter (Signed)
Pt called again wanting to follow up on his Lialda Rx. He said he has been out for 3 days and he doesn't want the symptoms to come back. I told him the nurse was aware and she was waiting to get recommendations from [facility]. [phone]

## 2018-09-13 NOTE — Telephone Encounter (Addendum)
PLEASE CALL PT. HE SHOULD CONTINUE LIALDA. HE SHOULD TAKE 4 A DAY FOR ANOTHER 4 WEEKS THEN 2 A DAY. HE SHOULD AVOID ASA/NSAIDS AND TOBACCO PRODUCTS.

## 2018-09-13 NOTE — Telephone Encounter (Signed)
See separate note. I have spoken to pt.

## 2018-09-13 NOTE — Telephone Encounter (Signed)
Pt got limited amount of Lialda when he was at the hospital.  He would like an Rx sent in and an idea of how long he might be on the medication.   Dr. Oneida Alar, please advise!

## 2018-09-13 NOTE — Telephone Encounter (Signed)
PT is aware.

## 2018-09-15 NOTE — Telephone Encounter (Signed)
Appears Rx sent 6/24. Please verify he received it.

## 2018-09-18 NOTE — Telephone Encounter (Signed)
Pt picked up medication.

## 2018-10-05 ENCOUNTER — Ambulatory Visit: Payer: 59 | Admitting: Gastroenterology

## 2018-10-05 LAB — CBC WITH DIFFERENTIAL/PLATELET
Absolute Monocytes: 599 cells/uL (ref 200–950)
Basophils Absolute: 131 cells/uL (ref 0–200)
Basophils Relative: 2.3 %
Eosinophils Absolute: 302 cells/uL (ref 15–500)
Eosinophils Relative: 5.3 %
HCT: 27.8 % — ABNORMAL LOW (ref 38.5–50.0)
Hemoglobin: 8.2 g/dL — ABNORMAL LOW (ref 13.2–17.1)
Lymphs Abs: 929 cells/uL (ref 850–3900)
MCH: 20.9 pg — ABNORMAL LOW (ref 27.0–33.0)
MCHC: 29.5 g/dL — ABNORMAL LOW (ref 32.0–36.0)
MCV: 70.7 fL — ABNORMAL LOW (ref 80.0–100.0)
MPV: 9.8 fL (ref 7.5–12.5)
Monocytes Relative: 10.5 %
Neutro Abs: 3739 cells/uL (ref 1500–7800)
Neutrophils Relative %: 65.6 %
Platelets: 326 10*3/uL (ref 140–400)
RBC: 3.93 10*6/uL — ABNORMAL LOW (ref 4.20–5.80)
RDW: 17.6 % — ABNORMAL HIGH (ref 11.0–15.0)
Total Lymphocyte: 16.3 %
WBC: 5.7 10*3/uL (ref 3.8–10.8)

## 2018-10-09 NOTE — Progress Notes (Signed)
Pt is aware and will keep apt this week.

## 2018-10-12 ENCOUNTER — Ambulatory Visit: Payer: 59 | Admitting: Gastroenterology

## 2018-11-22 ENCOUNTER — Ambulatory Visit: Payer: 59 | Admitting: Gastroenterology

## 2018-11-22 ENCOUNTER — Encounter: Payer: Self-pay | Admitting: Gastroenterology

## 2018-11-22 ENCOUNTER — Other Ambulatory Visit: Payer: Self-pay

## 2018-11-22 VITALS — BP 121/77 | HR 105 | Temp 97.1°F | Ht 69.0 in | Wt 205.2 lb

## 2018-11-22 DIAGNOSIS — K51011 Ulcerative (chronic) pancolitis with rectal bleeding: Secondary | ICD-10-CM | POA: Diagnosis not present

## 2018-11-22 DIAGNOSIS — D500 Iron deficiency anemia secondary to blood loss (chronic): Secondary | ICD-10-CM

## 2018-11-22 MED ORDER — FERROUS SULFATE 325 (65 FE) MG PO TBEC: 325.0000 mg | DELAYED_RELEASE_TABLET | Freq: Two times a day (BID) | ORAL | 3 refills | Status: DC

## 2018-11-22 NOTE — Progress Notes (Signed)
Primary Care Physician: Patient, No Pcp Per  Primary Gastroenterologist:  Barney Drain, MD   Chief Complaint  Patient presents with  . pan ulcerative colitis    f/u. Doing okay    HPI: Brett Bright is a 58 y.o. male here for hospital follow-up.  Diagnosed with pan ulcerative colitis in May 2020 during hospitalization.  Presented with 2-week history of weight loss, vomiting, diarrhea with blood per rectum.  Noted to have profound iron deficiency anemia (Hgb 6.9, ferritin 5) on evaluation, received 1 unit of packed red blood cells.  Patient has weaned off of prednisone.  He initially took Lialda 4.8 g daily.  He is back down to 2.4 g daily at this time.  Patient is currently doing well. BMs are regular. No blood in the stool. No abdominal pain. No heartburn, n/v. Weight up a few pounds. Still on iron twice per day. Last labs in 09/2018 with mild improvement in hemoglobin. States he feels stronger every week.   Current Outpatient Medications  Medication Sig Dispense Refill  . acetaminophen (TYLENOL) 325 MG tablet Take 2 tablets (650 mg total) by mouth every 6 (six) hours as needed for mild pain (or Fever >/= 101).    . ferrous sulfate 325 (65 FE) MG EC tablet Take 1 tablet (325 mg total) by mouth 2 (two) times daily with a meal. 60 tablet 3  . mesalamine (LIALDA) 1.2 g EC tablet Take 4 tablets (4.8 g total) by mouth daily with breakfast. (Patient taking differently: Take 2.4 g by mouth daily with breakfast. ) 120 tablet 11  . omeprazole (PRILOSEC) 20 MG capsule Take 20 mg by mouth daily.     No current facility-administered medications for this visit.     Allergies as of 11/22/2018  . (No Known Allergies)    ROS:  General: Negative for anorexia, weight loss, fever, chills, fatigue, weakness. ENT: Negative for hoarseness, difficulty swallowing , nasal congestion. CV: Negative for chest pain, angina, palpitations, dyspnea on exertion, peripheral edema.  Respiratory:  Negative for dyspnea at rest, dyspnea on exertion, cough, sputum, wheezing.  GI: See history of present illness. GU:  Negative for dysuria, hematuria, urinary incontinence, urinary frequency, nocturnal urination.  Endo: Negative for unusual weight change.    Physical Examination:   BP 121/77   Pulse (!) 105   Temp (!) 97.1 F (36.2 C) (Oral)   Ht 5' 9"  (1.753 m)   Wt 205 lb 3.2 oz (93.1 kg)   BMI 30.30 kg/m   General: Well-nourished, well-developed in no acute distress.  Eyes: No icterus. Mouth: Oropharyngeal mucosa moist and pink , no lesions erythema or exudate. Lungs: Clear to auscultation bilaterally.  Heart: Regular rate and rhythm, no murmurs rubs or gallops.  Abdomen: Bowel sounds are normal, nontender, nondistended, no hepatosplenomegaly or masses, no abdominal bruits or hernia , no rebound or guarding.   Extremities: No lower extremity edema. No clubbing or deformities. Neuro: Alert and oriented x 4   Skin: Warm and dry, no jaundice.   Psych: Alert and cooperative, normal mood and affect.  Labs:  Lab Results  Component Value Date   CREATININE 1.01 08/16/2018   BUN 11 08/16/2018   NA 138 08/16/2018   K 3.7 08/16/2018   CL 106 08/16/2018   CO2 24 08/16/2018   Lab Results  Component Value Date   WBC 5.7 10/05/2018   HGB 8.2 (L) 10/05/2018   HCT 27.8 (L) 10/05/2018   MCV 70.7 (L) 10/05/2018  PLT 326 10/05/2018   Lab Results  Component Value Date   IRON 5 (L) 08/15/2018   TIBC 221 (L) 08/15/2018   FERRITIN 5 (L) 08/15/2018   Lab Results  Component Value Date   ALT 16 08/15/2018   AST 20 08/15/2018   ALKPHOS 64 08/15/2018   BILITOT 0.6 08/15/2018    Imaging Studies: No results found.

## 2018-11-22 NOTE — Assessment & Plan Note (Signed)
Clinically doing well, in remission.  Continue current dosage of Lialda 2.4 grams daily.  We will plan to see him back in 6 months.

## 2018-11-22 NOTE — Patient Instructions (Addendum)
1. Have labs done at your convenience in the next few weeks.  2. Continue ferrous sulfate 340m twice a day with a meal. 3. Continue Lialda two tablets daily.  4. Return to the office in six months.

## 2018-11-22 NOTE — Assessment & Plan Note (Signed)
Profound IDA in the setting of undiagnosed ulcerative colitis. Continue oral iron. Recheck CBC, ferritin, basic met panel in the next few weeks. Return to the office in six months.

## 2018-12-20 LAB — CBC WITH DIFFERENTIAL/PLATELET
Absolute Monocytes: 718 cells/uL (ref 200–950)
Basophils Absolute: 90 cells/uL (ref 0–200)
Basophils Relative: 1.3 %
Eosinophils Absolute: 269 cells/uL (ref 15–500)
Eosinophils Relative: 3.9 %
HCT: 37.8 % — ABNORMAL LOW (ref 38.5–50.0)
Hemoglobin: 12.3 g/dL — ABNORMAL LOW (ref 13.2–17.1)
Lymphs Abs: 1097 cells/uL (ref 850–3900)
MCH: 24.9 pg — ABNORMAL LOW (ref 27.0–33.0)
MCHC: 32.5 g/dL (ref 32.0–36.0)
MCV: 76.5 fL — ABNORMAL LOW (ref 80.0–100.0)
MPV: 10.8 fL (ref 7.5–12.5)
Monocytes Relative: 10.4 %
Neutro Abs: 4727 cells/uL (ref 1500–7800)
Neutrophils Relative %: 68.5 %
Platelets: 228 10*3/uL (ref 140–400)
RBC: 4.94 10*6/uL (ref 4.20–5.80)
RDW: 16.7 % — ABNORMAL HIGH (ref 11.0–15.0)
Total Lymphocyte: 15.9 %
WBC: 6.9 10*3/uL (ref 3.8–10.8)

## 2018-12-20 LAB — BASIC METABOLIC PANEL
BUN: 14 mg/dL (ref 7–25)
CO2: 23 mmol/L (ref 20–32)
Calcium: 9 mg/dL (ref 8.6–10.3)
Chloride: 108 mmol/L (ref 98–110)
Creat: 0.88 mg/dL (ref 0.70–1.33)
Glucose, Bld: 83 mg/dL (ref 65–139)
Potassium: 3.8 mmol/L (ref 3.5–5.3)
Sodium: 140 mmol/L (ref 135–146)

## 2018-12-20 LAB — FERRITIN: Ferritin: 20 ng/mL — ABNORMAL LOW (ref 38–380)

## 2018-12-21 ENCOUNTER — Other Ambulatory Visit: Payer: Self-pay

## 2018-12-21 DIAGNOSIS — D649 Anemia, unspecified: Secondary | ICD-10-CM

## 2019-04-26 ENCOUNTER — Encounter: Payer: Self-pay | Admitting: Gastroenterology

## 2019-05-17 ENCOUNTER — Other Ambulatory Visit: Payer: Self-pay | Admitting: Gastroenterology

## 2019-07-17 ENCOUNTER — Encounter: Payer: Self-pay | Admitting: Emergency Medicine

## 2019-07-17 ENCOUNTER — Other Ambulatory Visit: Payer: Self-pay

## 2019-07-17 ENCOUNTER — Ambulatory Visit: Payer: 59 | Admitting: Nurse Practitioner

## 2019-07-17 ENCOUNTER — Encounter: Payer: Self-pay | Admitting: Nurse Practitioner

## 2019-07-17 DIAGNOSIS — R112 Nausea with vomiting, unspecified: Secondary | ICD-10-CM | POA: Diagnosis not present

## 2019-07-17 MED ORDER — ONDANSETRON 4 MG PO TBDP: 4.0000 mg | ORAL_TABLET | Freq: Four times a day (QID) | ORAL | 0 refills | Status: DC | PRN

## 2019-07-17 NOTE — Progress Notes (Signed)
No pcp per patient 

## 2019-07-17 NOTE — Patient Instructions (Addendum)
Your health issues we discussed today were:   Nausea and vomiting without abdominal pain, diarrhea, bloody stools: 1. As we discussed, I feel your symptoms are either due to food poisoning or a "stomach bug" that she picked up 2. I have sent Zofran dissolvable tablets to your pharmacy.  You can take this up to every 6 hours, as needed for nausea/vomiting 3. If you are unable to keep down food or fluids for another 24 hours, call us and/or go to the emergency room 4. We will call you on Thursday to check on you 5. When you are able to tolerate food and fluids, start slow with your food including clear liquids that she can advance to soft foods/bland diet, and then eventually back to a regular diet 6. Call us if you have any worsening symptoms or if you have any questions about anything we discussed today  Overall I recommend:  1. Continue your other current medications 2. Return for follow-up in 2 to 4 weeks 3. Call us if you have any questions or concerns 4. As we discussed, you should obtain a COVID-19 vaccine with ulcerative colitis.  There is no side effects to indicate a problem with UC and COVID-19 vaccine.   ---------------------------------------------------------------  COVID-19 Vaccine Information can be found at: [email or web address].co.uk For questions related to vaccine distribution or appointments, please email [email or web address] or call [phone].   ---------------------------------------------------------------   At [facility] we value your feedback. You may receive a survey about your visit today. Please share your experience as we strive to create trusting relationships with our patients to provide genuine, compassionate, quality care.  We appreciate your understanding and patience as we review any laboratory studies, imaging, and other diagnostic tests that are ordered as we care for you. Our office  policy is 5 business days for review of these results, and any emergent or urgent results are addressed in a timely manner for your best interest. If you do not hear from our office in 1 week, please contact us.   We also encourage the use of MyChart, which contains your medical information for your review as well. If you are not enrolled in this feature, an access code is on this after visit summary for your convenience. Thank you for allowing [place] to be involved in your care.  It was great to see you today!  I hope you have a great Summer!!      Nausea and Vomiting, Adult Nausea is the feeling that you have an upset stomach or that you are about to vomit. Vomiting is when stomach contents are thrown up and out of the mouth as a result of nausea. Vomiting can make you feel weak and cause you to become dehydrated. Dehydration can make you feel tired and thirsty, cause you to have a dry mouth, and decrease how often you urinate. Older adults and people with other diseases or a weak disease-fighting system (immune system) are at higher risk for dehydration. It is important to treat your nausea and vomiting as told by your health care provider. Follow these instructions at home: Watch your symptoms for any changes. Tell your health care provider about them. Follow these instructions to care for yourself at home. Eating and drinking      Take an oral rehydration solution (ORS). This is a drink that is sold at pharmacies and retail stores.  Drink clear fluids slowly and in small amounts as you are able. Clear fluids include water, ice chips, low-calorie sports drinks,  and fruit juice that has water added (diluted fruit juice).  Eat bland, easy-to-digest foods in small amounts as you are able. These foods include bananas, applesauce, rice, lean meats, toast, and crackers.  Avoid fluids that contain a lot of sugar or caffeine, such as energy drinks, sports drinks, and soda.  Avoid  alcohol.  Avoid spicy or fatty foods. General instructions  Take over-the-counter and prescription medicines only as told by your health care provider.  Drink enough fluid to keep your urine pale yellow.  Wash your hands often using soap and water. If soap and water are not available, use hand sanitizer.  Make sure that all people in your household wash their hands well and often.  Rest at home while you recover.  Watch your condition for any changes.  Breathe slowly and deeply when you feel nauseated.  Keep all follow-up visits as told by your health care provider. This is important. Contact a health care provider if:  Your symptoms get worse.  You have new symptoms.  You have a fever.  You cannot drink fluids without vomiting.  Your nausea does not go away after 2 days.  You feel light-headed or dizzy.  You have a headache.  You have muscle cramps.  You have a rash.  You have pain while urinating. Get help right away if:  You have pain in your chest, neck, arm, or jaw.  You feel extremely weak or you faint.  You have persistent vomiting.  You have vomit that is bright red or looks like black coffee grounds.  You have bloody or black stools or stools that look like tar.  You have a severe headache, a stiff neck, or both.  You have severe pain, cramping, or bloating in your abdomen.  You have difficulty breathing, or you are breathing very quickly.  Your heart is beating very quickly.  Your skin feels cold and clammy.  You feel confused.  You have signs of dehydration, such as: ? Dark urine, very little urine, or no urine. ? Cracked lips. ? Dry mouth. ? Sunken eyes. ? Sleepiness. ? Weakness. These symptoms may represent a serious problem that is an emergency. Do not wait to see if the symptoms will go away. Get medical help right away. Call your local emergency services (911 in the [place] Do not drive yourself to the hospital. Summary  Nausea  is the feeling that you have an upset stomach or that you are about to vomit. As nausea gets worse, it can lead to vomiting. Vomiting can make you feel weak and cause you to become dehydrated.  Follow instructions from your health care provider about eating and drinking to prevent dehydration.  Take over-the-counter and prescription medicines only as told by your health care provider.  Contact your health care provider if your symptoms get worse, or you have new symptoms.  Keep all follow-up visits as told by your health care provider. This is important. This information is not intended to replace advice given to you by your health care provider. Make sure you discuss any questions you have with your health care provider. Document Revised: 06/30/2018 Document Reviewed: 08/16/2017 Elsevier Patient Education  [place].

## 2019-07-17 NOTE — Assessment & Plan Note (Signed)
The patient describes that last Friday he was feeling fine.  He went out to eat to a restaurant that had greasy food, which he typically does not eat.  He woke up Saturday morning with a low-grade fever, nausea, vomiting.  His fever resolved about 24 hours later but he has persistent nausea and vomiting.  He has been unable to keep down much food or fluids in the past 72 hours.  He does not appear grossly dehydrated at this point.  He denies abdominal pain, diarrhea, hematochezia.  Not generally consistent with a UC flare.  Overall I feel he likely has either food poisoning or self-limiting viral gastroenteritis.  I will send him for Zofran ODT to the pharmacy.  We discussed ER precautions including if he is unable to tolerate food or fluids for another 12 to 24 hours then he should call us and/or go to the emergency room.  I will have staff check on him in 48 hours.  We will provide him with a work note.  Follow-up in 2 to 4 weeks for close monitoring.

## 2019-07-17 NOTE — Progress Notes (Signed)
Referring Provider: No ref. provider found Primary Care Physician:  Patient, No Pcp Per Primary GI:  Dr. Oneida Alar  Chief Complaint  Patient presents with  . Nausea    w/ vomiting x saturday. Denies abd pain, no diarrhea, no constipation. thinks may be having colitis flare    HPI:   Brett Bright is a 59 y.o. male who presents for nausea and vomiting.  The patient was last seen in our office 11/22/2018 for ulcerative pancolitis and IDA.  Diagnosed with ulcerative colitis in May 2020 during hospitalization after a 2-week history of weight loss, vomiting, diarrhea, bright red blood per rectum.  Noted profound iron deficiency anemia with a hemoglobin of 6.9 and ferritin of 5.  Was transfused 1 unit of PRBCs during that hospitalization.  At his last visit noted to be weaned off prednisone, initially took Lialda 4.8 g daily and is back down to 2.4 g.  Was doing well at last visit with regular bowel movements, no pain, no blood.  No heartburn, nausea/vomiting.  Weight has come up a few pounds.  Still on iron twice daily.  Feeling stronger.  Recommended update labs, continue iron, continue Lialda, follow-up in 6 months  Labs completed 12/19/2018.  CBC found significantly improved hemoglobin at 12.3 (previously 8.2).  No leukocytosis or thrombocytopenia.  Indices remain consistent with IDA.  Ferritin found improvement to 20 (previously 5).  BMP was normal.  Again recommended continue oral iron, repeat CBC and ferritin in 4 months.  It does not appear the repeat labs were completed.  Today he states he's doing ok overall. He started having nausea and vomiting (just bile) and low grade fever Saturday morning. The fever quickly resolved and none since Sunday (2 days ago). Still with persistent nausea and vomiting. Today he was vomiting every other hour, typically after eating/drinking. Unable to keep down food/fluids in the past 2-3 days. Friday night he had a cheeseburger at [facility] which he  says "was probably a mistake." He doesn't usually eat a lot of greasy foods. No other foods out of the ordinary. No recent sick contacts or travel. Denies abdominal pain, diarrhea, hematochezia, melena, unintentional weight loss. Yesterday his stools were dark, but they've since lightened. Denies URI or flu-like symptoms. Denies loss of sense of taste or smell. He has not had a COVID-19 vaccine. He was wondering about vaccination with UC. We discussed and I printed resources for him.  Past Medical History:  Diagnosis Date  . Ulcerative colitis with rectal bleeding ([place]) 07/2018    Past Surgical History:  Procedure Laterality Date  . APPENDECTOMY    . BIOPSY  08/16/2018   Procedure: BIOPSY;  Surgeon: Daneil Dolin, MD;  Location: [facility];  Service: Endoscopy;;  . CHOLECYSTECTOMY    . COLONOSCOPY N/A 03/23/2016   Dr. Oneida Alar: Thrombosed external hemorrhoid, internal hemorrhoids, diverticulosis, segmental moderate inflammation sigmoid colon secondary due to colitis with benign biopsies.  Next colonoscopy 5 years due to poor prep right colon.  . COLONOSCOPY N/A 08/16/2018   Dr. Gala Romney: pan-ulcerative colitis, diverticulosis. Bx c/w UC  . ESOPHAGOGASTRODUODENOSCOPY N/A 03/23/2016   Dr. Oneida Alar: Gastritis, no H. pylori, multiple small sessile polyps biopsy consistent with fundic gland, mild duodenitis.  Marland Kitchen ESOPHAGOGASTRODUODENOSCOPY N/A 08/16/2018   Procedure: ESOPHAGOGASTRODUODENOSCOPY (EGD);  Surgeon: Daneil Dolin, MD;  Location: [facility];  Service: Endoscopy;  Laterality: N/A;  . VASECTOMY      Current Outpatient Medications  Medication Sig Dispense Refill  . Ascorbic  Acid (VITAMIN C PO) Take by mouth. Drops daily    . aspirin 325 MG tablet Take 325 mg by mouth as needed.    . FEROSUL 325 (65 Fe) MG tablet TAKE 1 TABLET BY MOUTH TWICE DAILY WITH A MEAL 60 tablet 3  . mesalamine (LIALDA) 1.2 g EC tablet Take 4 tablets (4.8 g total) by mouth daily with breakfast. (Patient taking  differently: Take 2.4 g by mouth daily with breakfast. ) 120 tablet 11  . omeprazole (PRILOSEC) 20 MG capsule Take 20 mg by mouth daily.    . ondansetron (ZOFRAN ODT) 4 MG disintegrating tablet Take 1 tablet (4 mg total) by mouth every 6 (six) hours as needed for nausea or vomiting. 20 tablet 0   No current facility-administered medications for this visit.    Allergies as of 07/17/2019  . (No Known Allergies)    Family History  Problem Relation Age of Onset  . Stroke Mother   . Cancer Maternal Grandfather        Unknown type of cancer  . Heart attack Paternal Grandmother   . Cancer Maternal Aunt        Possibly lung cancer  . Cancer Paternal Uncle        Unknown type of cancer  . Cancer Paternal Uncle        Unknown type of cancer  . Colon cancer Neg Hx     Social History   Socioeconomic History  . Marital status: Married    Spouse name: Not on file  . Number of children: 3  . Years of education: Not on file  . Highest education level: Not on file  Occupational History  . Occupation: Primary school teacher    Comment: At [employer]   Tobacco Use  . Smoking status: Never Smoker  . Smokeless tobacco: Never Used  Substance and Sexual Activity  . Alcohol use: No  . Drug use: No  . Sexual activity: Not on file  Other Topics Concern  . 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   He is married and has 3 children, who are ages 43, 7, and 68 as of 2017.   The 89 and 82 year old children are still living at home, but the 58 yo is married and lives elsewhere.   The patient lives in [place], [place].      His primary care is [facility] in [place], [place].   Social Determinants of Health   Financial Resource Strain:   . Difficulty of Paying Living Expenses:   Food Insecurity:   . Worried About Charity fundraiser in the Last Year:   . Arboriculturist in the Last Year:   Transportation Needs:     . Film/video editor (Medical):   Marland Kitchen Lack of Transportation (Non-Medical):   Physical Activity:   . Days of Exercise per Week:   . Minutes of Exercise per Session:   Stress:   . Feeling of Stress :   Social Connections:   . Frequency of Communication with Friends and Family:   . Frequency of Social Gatherings with Friends and Family:   . Attends Religious Services:   . Active Member of Clubs or Organizations:   . Attends Archivist Meetings:   Marland Kitchen Marital Status:     Subjective: Review of Systems  Constitutional: Positive for fever (low grade, resolved). Negative  for chills, malaise/fatigue and weight loss.  HENT: Negative for congestion and sore throat.   Respiratory: Negative for cough and shortness of breath.   Cardiovascular: Negative for chest pain and palpitations.  Gastrointestinal: Positive for nausea and vomiting. Negative for abdominal pain, blood in stool, constipation, diarrhea and melena.  Musculoskeletal: Negative for joint pain and myalgias.  Skin: Negative for rash.  Neurological: Negative for dizziness and weakness.  Endo/Heme/Allergies: Does not bruise/bleed easily.  Psychiatric/Behavioral: Negative for depression. The patient is not nervous/anxious.   All other systems reviewed and are negative.    Objective: BP 117/78   Pulse 97   Temp 98.7 F (37.1 C) (Oral)   Ht 5' 9"  (1.753 m)   Wt 220 lb 3.2 oz (99.9 kg)   BMI 32.52 kg/m  Physical Exam Vitals and nursing note reviewed.  Constitutional:      General: He is not in acute distress.    Appearance: Normal appearance. He is obese. He is not ill-appearing, toxic-appearing or diaphoretic.     Comments: Appears uncomfortable  HENT:     Head: Normocephalic and atraumatic.     Nose: No congestion or rhinorrhea.  Eyes:     General: No scleral icterus. Cardiovascular:     Rate and Rhythm: Normal rate and regular rhythm.     Heart sounds: Normal heart sounds.  Pulmonary:     Effort:  Pulmonary effort is normal.     Breath sounds: Normal breath sounds.  Abdominal:     General: Bowel sounds are normal. There is no distension.     Palpations: Abdomen is soft. There is no hepatomegaly, splenomegaly or mass.     Tenderness: There is no abdominal tenderness. There is no guarding or rebound.     Hernia: No hernia is present.  Musculoskeletal:     Cervical back: Neck supple.  Skin:    General: Skin is warm and dry.     Coloration: Skin is not jaundiced.     Findings: No bruising or rash.  Neurological:     General: No focal deficit present.     Mental Status: He is alert and oriented to person, place, and time. Mental status is at baseline.  Psychiatric:        Mood and Affect: Mood normal.        Behavior: Behavior normal.        Thought Content: Thought content normal.       07/17/2019 3:23 PM   Disclaimer: This note was dictated with voice recognition software. Similar sounding words can inadvertently be transcribed and may not be corrected upon review.

## 2019-07-19 ENCOUNTER — Encounter (HOSPITAL_COMMUNITY): Payer: Self-pay

## 2019-07-19 ENCOUNTER — Observation Stay (HOSPITAL_COMMUNITY)
Admission: EM | Admit: 2019-07-19 | Discharge: 2019-07-20 | Disposition: A | Discharge status: Home / Self Care | Payer: 59 | Attending: Family Medicine | Admitting: Family Medicine

## 2019-07-19 ENCOUNTER — Other Ambulatory Visit: Payer: Self-pay

## 2019-07-19 ENCOUNTER — Emergency Department (HOSPITAL_COMMUNITY): Payer: 59

## 2019-07-19 DIAGNOSIS — K51018 Ulcerative (chronic) pancolitis with other complication: Secondary | ICD-10-CM

## 2019-07-19 DIAGNOSIS — K5190 Ulcerative colitis, unspecified, without complications: Principal | ICD-10-CM | POA: Insufficient documentation

## 2019-07-19 DIAGNOSIS — E871 Hypo-osmolality and hyponatremia: Secondary | ICD-10-CM | POA: Diagnosis not present

## 2019-07-19 DIAGNOSIS — N289 Disorder of kidney and ureter, unspecified: Secondary | ICD-10-CM | POA: Diagnosis not present

## 2019-07-19 DIAGNOSIS — Z7982 Long term (current) use of aspirin: Secondary | ICD-10-CM | POA: Diagnosis not present

## 2019-07-19 DIAGNOSIS — Z79899 Other long term (current) drug therapy: Secondary | ICD-10-CM | POA: Insufficient documentation

## 2019-07-19 DIAGNOSIS — E876 Hypokalemia: Secondary | ICD-10-CM | POA: Insufficient documentation

## 2019-07-19 DIAGNOSIS — Z20822 Contact with and (suspected) exposure to covid-19: Secondary | ICD-10-CM | POA: Insufficient documentation

## 2019-07-19 DIAGNOSIS — R112 Nausea with vomiting, unspecified: Secondary | ICD-10-CM | POA: Diagnosis present

## 2019-07-19 DIAGNOSIS — K51919 Ulcerative colitis, unspecified with unspecified complications: Secondary | ICD-10-CM | POA: Diagnosis not present

## 2019-07-19 LAB — URINALYSIS, ROUTINE W REFLEX MICROSCOPIC
Glucose, UA: 100 mg/dL — AB
Hgb urine dipstick: NEGATIVE
Ketones, ur: NEGATIVE mg/dL
Leukocytes,Ua: NEGATIVE
Nitrite: NEGATIVE
Protein, ur: 100 mg/dL — AB
Specific Gravity, Urine: 1.02 (ref 1.005–1.030)
pH: 6 (ref 5.0–8.0)

## 2019-07-19 LAB — CBC
HCT: 40 % (ref 39.0–52.0)
Hemoglobin: 13.2 g/dL (ref 13.0–17.0)
MCH: 27.4 pg (ref 26.0–34.0)
MCHC: 33 g/dL (ref 30.0–36.0)
MCV: 83 fL (ref 80.0–100.0)
Platelets: 167 10*3/uL (ref 150–400)
RBC: 4.82 MIL/uL (ref 4.22–5.81)
RDW: 13.2 % (ref 11.5–15.5)
WBC: 3.7 10*3/uL — ABNORMAL LOW (ref 4.0–10.5)
nRBC: 0 % (ref 0.0–0.2)

## 2019-07-19 LAB — COMPREHENSIVE METABOLIC PANEL
ALT: 51 U/L — ABNORMAL HIGH (ref 0–44)
AST: 58 U/L — ABNORMAL HIGH (ref 15–41)
Albumin: 3.2 g/dL — ABNORMAL LOW (ref 3.5–5.0)
Alkaline Phosphatase: 95 U/L (ref 38–126)
Anion gap: 12 (ref 5–15)
BUN: 19 mg/dL (ref 6–20)
CO2: 21 mmol/L — ABNORMAL LOW (ref 22–32)
Calcium: 8.2 mg/dL — ABNORMAL LOW (ref 8.9–10.3)
Chloride: 98 mmol/L (ref 98–111)
Creatinine, Ser: 0.87 mg/dL (ref 0.61–1.24)
GFR calc Af Amer: >60 mL/min (ref >60)
GFR calc non Af Amer: >60 mL/min (ref >60)
Glucose, Bld: 135 mg/dL — ABNORMAL HIGH (ref 70–99)
Potassium: 3 mmol/L — ABNORMAL LOW (ref 3.5–5.1)
Sodium: 131 mmol/L — ABNORMAL LOW (ref 135–145)
Total Bilirubin: 1.1 mg/dL (ref 0.3–1.2)
Total Protein: 7.7 g/dL (ref 6.5–8.1)

## 2019-07-19 LAB — URINALYSIS, MICROSCOPIC (REFLEX)
RBC / HPF: NONE SEEN RBC/hpf (ref 0–5)
Squamous Epithelial / HPF: NONE SEEN (ref 0–5)

## 2019-07-19 LAB — RESPIRATORY PANEL BY RT PCR (FLU A&B, COVID)
Influenza A by PCR: NEGATIVE
Influenza B by PCR: NEGATIVE
SARS Coronavirus 2 by RT PCR: NEGATIVE

## 2019-07-19 LAB — LIPASE, BLOOD: Lipase: 44 U/L (ref 11–51)

## 2019-07-19 MED ORDER — SODIUM CHLORIDE 0.9 % IV BOLUS
1000.0000 mL | Freq: Once | INTRAVENOUS | Status: AC
  Administered 2019-07-19: 14:00:00 1000 mL via INTRAVENOUS

## 2019-07-19 MED ORDER — LACTATED RINGERS IV SOLN: INTRAVENOUS | Status: DC

## 2019-07-19 MED ORDER — ACETAMINOPHEN 650 MG RE SUPP: 650.0000 mg | Freq: Four times a day (QID) | RECTAL | Status: DC | PRN

## 2019-07-19 MED ORDER — ACETAMINOPHEN 325 MG PO TABS: 650.0000 mg | ORAL_TABLET | Freq: Four times a day (QID) | ORAL | Status: DC | PRN

## 2019-07-19 MED ORDER — PANTOPRAZOLE SODIUM 40 MG PO TBEC
40.0000 mg | DELAYED_RELEASE_TABLET | Freq: Every day | ORAL | Status: DC
  Administered 2019-07-20: 40 mg via ORAL
  Filled 2019-07-19 (×2): qty 1

## 2019-07-19 MED ORDER — MESALAMINE 1.2 G PO TBEC
4.8000 g | DELAYED_RELEASE_TABLET | Freq: Every day | ORAL | Status: DC
  Administered 2019-07-20: 4.8 g via ORAL
  Filled 2019-07-19 (×4): qty 4

## 2019-07-19 MED ORDER — ENOXAPARIN SODIUM 40 MG/0.4ML ~~LOC~~ SOLN
40.0000 mg | Freq: Every day | SUBCUTANEOUS | Status: DC
  Administered 2019-07-20: 40 mg via SUBCUTANEOUS
  Filled 2019-07-19: qty 0.4

## 2019-07-19 MED ORDER — IOHEXOL 300 MG/ML  SOLN
100.0000 mL | Freq: Once | INTRAMUSCULAR | Status: AC | PRN
  Administered 2019-07-19: 17:00:00 100 mL via INTRAVENOUS

## 2019-07-19 MED ORDER — ONDANSETRON HCL 4 MG PO TABS: 4.0000 mg | ORAL_TABLET | Freq: Four times a day (QID) | ORAL | Status: DC | PRN

## 2019-07-19 MED ORDER — POTASSIUM CHLORIDE CRYS ER 20 MEQ PO TBCR
40.0000 meq | EXTENDED_RELEASE_TABLET | Freq: Four times a day (QID) | ORAL | Status: AC
  Administered 2019-07-19 – 2019-07-20 (×2): 40 meq via ORAL
  Filled 2019-07-19 (×2): qty 2

## 2019-07-19 MED ORDER — METHYLPREDNISOLONE SODIUM SUCC 40 MG IJ SOLR
30.0000 mg | Freq: Two times a day (BID) | INTRAMUSCULAR | Status: DC
  Administered 2019-07-19 – 2019-07-20 (×2): 30 mg via INTRAVENOUS
  Filled 2019-07-19 (×2): qty 1

## 2019-07-19 MED ORDER — ONDANSETRON HCL 4 MG/2ML IJ SOLN
4.0000 mg | Freq: Four times a day (QID) | INTRAMUSCULAR | Status: DC | PRN
  Administered 2019-07-19: 4 mg via INTRAVENOUS
  Filled 2019-07-19: qty 2

## 2019-07-19 MED ORDER — ACETAMINOPHEN 325 MG PO TABS
650.0000 mg | ORAL_TABLET | Freq: Once | ORAL | Status: AC
  Administered 2019-07-19: 09:00:00 650 mg via ORAL
  Filled 2019-07-19: qty 2

## 2019-07-19 MED ORDER — ONDANSETRON HCL 4 MG/2ML IJ SOLN
4.0000 mg | Freq: Once | INTRAMUSCULAR | Status: AC
  Administered 2019-07-19: 14:00:00 4 mg via INTRAVENOUS
  Filled 2019-07-19: qty 2

## 2019-07-19 NOTE — H&P (Signed)
TRH H&P   Patient Demographics:    Brett Bright, is a 59 y.o. male  MRN: [id number]   DOB - Sep 13, 1960  Admit Date - 07/19/2019  Outpatient Primary MD for the patient is Patient, No Pcp Per  Referring MD/NP/PA: PA Triplett  Patient coming from: Home  Chief Complaint  Patient presents with  . Emesis      HPI:    Brett Bright  is a 59 y.o. male, with past medical history of ulcerative colitis, iron deficiency anemia, ulcerative colitis diagnosed last year, since then he has been on remission with mesalamine, patient presents to ED secondary to complaints of nausea and diarrhea, patient reports symptoms started on Saturday, nausea and diarrhea, he denies any vomiting, abdominal pain, he reports he had some low-grade temperature as well, he was seen by GI office 2 days ago for possible food poisoning, with no improvement of symptoms which prompted him to come to ED, he denies any chest pain, shortness of breath, abdominal pain, coffee-ground emesis or blood per rectum(his previous presentation was diarrhea with blood when he was diagnosed with ulcerative colitis). -In ED CT abdomen and pelvis was significant for acute on chronic colitis with pericolonic stranding, and IVM thrombosis, his potassium 3, and sodium is low at 131, we are consulted to admit.    Review of systems:    In addition to the HPI above,  No Fever-chills, No Headache, No changes with Vision or hearing, No problems swallowing food or Liquids, No Chest pain, Cough or Shortness of Breath, No Abdominal pain, does report nausea and diarrhea .No Blood in stool or Urine, No dysuria, No new skin rashes or bruises, No new joints pains-aches,  No new weakness, tingling, numbness in any extremity, No recent weight gain or loss, No polyuria, polydypsia or polyphagia, No significant Mental Stressors.  A full 10  point Review of Systems was done, except as stated above, all other Review of Systems were negative.   With Past History of the following :    Past Medical History:  Diagnosis Date  . Ulcerative colitis with rectal bleeding ([place]) 07/2018      Past Surgical History:  Procedure Laterality Date  . APPENDECTOMY    . BIOPSY  08/16/2018   Procedure: BIOPSY;  Surgeon: Daneil Dolin, MD;  Location: [facility];  Service: Endoscopy;;  . CHOLECYSTECTOMY    . COLONOSCOPY N/A 03/23/2016   Dr. Oneida Alar: Thrombosed external hemorrhoid, internal hemorrhoids, diverticulosis, segmental moderate inflammation sigmoid colon secondary due to colitis with benign biopsies.  Next colonoscopy 5 years due to poor prep right colon.  . COLONOSCOPY N/A 08/16/2018   Dr. Gala Romney: pan-ulcerative colitis, diverticulosis. Bx c/w UC  . ESOPHAGOGASTRODUODENOSCOPY N/A 03/23/2016   Dr. Oneida Alar: Gastritis, no H. pylori, multiple small sessile polyps biopsy consistent with fundic gland, mild duodenitis.  Marland Kitchen ESOPHAGOGASTRODUODENOSCOPY N/A 08/16/2018   Procedure: ESOPHAGOGASTRODUODENOSCOPY (EGD);  Surgeon: Daneil Dolin, MD;  Location: [facility];  Service: Endoscopy;  Laterality: N/A;  . VASECTOMY        Social History:     Social History   Tobacco Use  . Smoking status: Never Smoker  . Smokeless tobacco: Never Used  Substance Use Topics  . Alcohol use: No       Family History :     Family History  Problem Relation Age of Onset  . Stroke Mother   . Cancer Maternal Grandfather        Unknown type of cancer  . Heart attack Paternal Grandmother   . Cancer Maternal Aunt        Possibly lung cancer  . Cancer Paternal Uncle        Unknown type of cancer  . Cancer Paternal Uncle        Unknown type of cancer  . Colon cancer Neg Hx      Home Medications:   Prior to Admission medications   Medication Sig Start Date End Date Taking? Authorizing Provider  Ascorbic Acid (VITAMIN C PO) Take by mouth. Drops  daily    [provider]  aspirin 325 MG tablet Take 325 mg by mouth as needed.    [provider]  FEROSUL 325 (65 Fe) MG tablet TAKE 1 TABLET BY MOUTH TWICE DAILY WITH A MEAL 05/22/19   Aliene Altes S, PA-C  mesalamine (LIALDA) 1.2 g EC tablet Take 4 tablets (4.8 g total) by mouth daily with breakfast. Patient taking differently: Take 2.4 g by mouth daily with breakfast.  09/13/18   Fields, Marga Melnick, MD  omeprazole (PRILOSEC) 20 MG capsule Take 20 mg by mouth daily.    [provider]  ondansetron (ZOFRAN ODT) 4 MG disintegrating tablet Take 1 tablet (4 mg total) by mouth every 6 (six) hours as needed for nausea or vomiting. 07/17/19   Carlis Stable, NP     Allergies:    No Known Allergies   Physical Exam:   Vitals  Blood pressure 101/67, pulse 91, temperature (!) 100.7 F (38.2 C), temperature source Oral, resp. rate 20, height 5' 9"  (1.753 m), weight 99.8 kg, SpO2 98 %.   1. General developed male, laying in bed in no apparent distress.  2. Normal affect and insight, Not Suicidal or Homicidal, Awake Alert, Oriented X 3.  3. No F.N deficits, ALL C.Nerves Intact, Strength 5/5 all 4 extremities, Sensation intact all 4 extremities, Plantars down going.  4. Ears and Eyes appear Normal, Conjunctivae clear, PERRLA. Moist Oral Mucosa.  5. Supple Neck, No JVD, No cervical lymphadenopathy appriciated, No Carotid Bruits.  6. Symmetrical Chest wall movement, Good air movement bilaterally, CTAB.  7. RRR, No Gallops, Rubs or Murmurs, No Parasternal Heave.  8. Positive Bowel Sounds, Abdomen Soft, No tenderness, No organomegaly appriciated,No rebound -guarding or rigidity.  9.  No Cyanosis, Normal Skin Turgor, No Skin Rash or Bruise.  10. Good muscle tone,  joints appear normal , no effusions, Normal ROM.  11. No Palpable Lymph Nodes in Neck or Axillae    Data Review:    CBC Recent Labs  Lab 07/19/19 0923  WBC 3.7*  HGB 13.2  HCT 40.0  PLT 167  MCV  83.0  MCH 27.4  MCHC 33.0  RDW 13.2   ------------------------------------------------------------------------------------------------------------------  Chemistries  Recent Labs  Lab 07/19/19 0923  NA 131*  K 3.0*  CL 98  CO2 21*  GLUCOSE 135*  BUN 19  CREATININE 0.87  CALCIUM 8.2*  AST 58*  ALT 51*  ALKPHOS 95  BILITOT 1.1   ------------------------------------------------------------------------------------------------------------------ estimated creatinine clearance is 107.7 mL/min (by C-G formula based on SCr of 0.87 mg/dL). ------------------------------------------------------------------------------------------------------------------ No results for input(s): TSH, T4TOTAL, T3FREE, THYROIDAB in the last 72 hours.  Invalid input(s): FREET3  Coagulation profile No results for input(s): INR, PROTIME in the last 168 hours. ------------------------------------------------------------------------------------------------------------------- No results for input(s): DDIMER in the last 72 hours. -------------------------------------------------------------------------------------------------------------------  Cardiac Enzymes No results for input(s): CKMB, TROPONINI, MYOGLOBIN in the last 168 hours.  Invalid input(s): CK ------------------------------------------------------------------------------------------------------------------ No results found for: BNP   ---------------------------------------------------------------------------------------------------------------  Urinalysis    Component Value Date/Time   COLORURINE YELLOW 07/19/2019 [place] 07/19/2019 0955   LABSPEC 1.020 07/19/2019 0955   PHURINE 6.0 07/19/2019 0955   GLUCOSEU 100 (A) 07/19/2019 0955   HGBUR NEGATIVE 07/19/2019 0955   BILIRUBINUR SMALL (A) 07/19/2019 0955   KETONESUR NEGATIVE 07/19/2019 0955   PROTEINUR 100 (A) 07/19/2019 0955   UROBILINOGEN 0.2 08/20/2012 0923    NITRITE NEGATIVE 07/19/2019 0955   LEUKOCYTESUR NEGATIVE 07/19/2019 0955    ----------------------------------------------------------------------------------------------------------------   Imaging Results:    CT ABDOMEN PELVIS W CONTRAST  Addendum Date: 07/19/2019   ADDENDUM REPORT: 07/19/2019 17:37 ADDENDUM: Critical Value/emergent results were called by telephone at the time of interpretation on 07/19/2019 at 5:37 pm to provider Kem Parkinson, who verbally acknowledged these results. Electronically Signed   By: Zetta Bills M.D.   On: 07/19/2019 17:37   Result Date: 07/19/2019 CLINICAL DATA:  Abdominal abscess, infection suspected in the setting of ulcerative colitis EXAM: CT ABDOMEN AND PELVIS WITH CONTRAST TECHNIQUE: Multidetector CT imaging of the abdomen and pelvis was performed using the standard protocol following bolus administration of intravenous contrast. CONTRAST:  138m OMNIPAQUE IOHEXOL 300 MG/ML  SOLN COMPARISON:  03/22/2016 FINDINGS: Lower chest: Lung bases are clear. No consolidation. No pleural effusion. Hepatobiliary: Lobular hepatic contours with fissural widening. Signs of low-density with linear pattern tracking into the RIGHT hepatic lobe. This is a vascular pattern in likely relates to prior portal vein thrombosis that was present in 2018. Post cholecystectomy without significant biliary ductal distension. Pancreas: Pancreas is normal. Spleen: Spleen is normal size without focal lesion. Adrenals/Urinary Tract: Adrenal glands are normal. Large RIGHT renal cyst is increased in size arising from lower pole without suspicious features measuring 12 x 11 cm as compared to 11 cm greatest axial dimension. There is no hydronephrosis. Multiple RIGHT renal lesions of variable density most reflecting hemorrhagic cysts. Some new however compared to exam dating back to 2014. The largest hyperdense lesion along the anterior margin measured 80 Hounsfield units in 2014 and is compatible with  hemorrhagic cyst. Other small lesions higher density are indeterminate and potentially enhancing based on this evaluation. Upper pole cyst on the LEFT density 6 Hounsfield units. No hydronephrosis. Urinary bladder is normal. Stomach/Bowel: Stomach and small bowel are normal. Positive contrast fills the gastrointestinal tract. Segmental thickening of the sigmoid colon with marked pericolonic stranding. Contrast mixed with gas along the posterior wall likely intramural sinus tract. (Image 78, series 3 through image 73, series 3 going from inferior to superior. Focal outpouching along the inferior margin of this area may also represent a focus of intramural sinus tract formation best seen on coronal images. Extensive pericolonic inflammation. Numerous lymph nodes within the colonic mesentery largest along the LEFT psoas muscle (image 67, series 3) 1.6 cm in short axis dimension. Thrombosis of the superior rectal vein and  a portion of the IMV in this patient with history of prior portal thrombus suspicious for septic thrombophlebitis Vascular/Lymphatic: Normal caliber abdominal aorta. No additional adenopathy in the retroperitoneum or in the upper abdomen. No pelvic lymphadenopathy. Reproductive: Prostate is heterogeneous. Other: No sign of free air. Extensive sigmoid stranding, worse than on the previous exam. Musculoskeletal: No acute bone finding. No destructive bone process. IMPRESSION: 1. Signs of worsening acute on chronic inflammation, sinus tract formation in the wall of the colon and increased pericolonic stranding in this patient with ulcerative colitis. 2. 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 Inflammation within the colon likely leading to septic thrombophlebitis. 3. Given the marked colonic thickening the possibility of underlying neoplasm remains a consideration and is seen in the setting of increasing size of retroperitoneal lymph nodes. Follow-up colonoscopy is suggested. 4.  Lobular hepatic contours raise the question of liver disease/fibrosis or cirrhosis. Laboratory and clinical correlation may be helpful. Scarring in the liver from prior RIGHT portal thrombosis. 5. Multiple RIGHT renal lesions of variable density most reflecting hemorrhagic cysts. Some new however compared to exam dating back to 2014. Other small lesions higher density are indeterminate and potentially enhancing based on this evaluation. Consider follow-up renal protocol CT or MRI on a nonemergent basis, within 3-6 months. Call is out to the referring provider to further discuss findings in the above case. Electronically Signed: By: Zetta Bills M.D. On: 07/19/2019 17:30      Assessment & Plan:    Active Problems:   Ulcerative colitis ([place])   Ulcerative colitis exacerbation -Patient diagnosed with ulcerative colitis last year, he is mesalamine, he presents with worsening nausea and diarrhea over last 5 days, CT abdomen pelvis showing acute on chronic colitis, with thickening of sigmoid colon with pericolonic stranding, as well extensive pericolonic inflammation thrombosis of the superior rectal vein and portion of the IMV with previous history of portal thrombus suspicious for septic thrombophlebitis. -I have discussed with Dr. Rutherford Limerick, will start on IV Solu-Medrol, clear liquid diet, IV fluids. -We will hold on anticoagulation given IMV is small vein, and this will be reassessed further by GI tomorrow. -Continue IV fluids.  Hypokalemia -Repleted, recheck in a.m.  Hyponatremia -Sodium of 131, volume depletion, continue with IV fluids  Scarring in the liver from prior RIGHT portal thrombosis.  Multiple RIGHT renal lesions of variable density most reflecting hemorrhagic cysts. Some new however compared to exam dating back to 2014. Other small lesions higher density are indeterminate and potentially enhancing based on this evaluation.  - Consider follow-up renal protocol CT or MRI on a  nonemergent basis, within 3-6 months.   DVT Prophylaxis   Lovenox  AM Labs Ordered, also please review Full Orders  Family Communication: Admission, patients condition and plan of care including tests being ordered have been discussed with the patient who indicate understanding and agree with the plan and Code Status.  Code Status Full  Likely DC to  Home  Condition GUARDED    Consults called: GI  Admission status: Inpatient  Time spent in minutes : 55 minutes   Phillips Climes M.D on 07/19/2019 at 8:30 PM  Between 7am to 7pm - Pager - [phone]. After 7pm go to [email or web address] - password [email or web address]  [facility] - Office  [phone]

## 2019-07-19 NOTE — ED Provider Notes (Signed)
Pt signed out to me by Evalee Jefferson, PA-C pending CT abd/pelvis results.   Patient is a 59 year old male with a history of ulcerative colitis and iron deficient anemia.  He presented to the ER with nausea and vomiting for several days with low-grade fever.  He was seen at his GI office 2 days ago for possible food poisoning.  No diarrhea or abdominal pain which he states is typical for his UC flares.  Takes Lialda.    CT ABDOMEN PELVIS W CONTRAST  Result Date: 07/19/2019 CLINICAL DATA:  Abdominal abscess, infection suspected in the setting of ulcerative colitis EXAM: CT ABDOMEN AND PELVIS WITH CONTRAST TECHNIQUE: Multidetector CT imaging of the abdomen and pelvis was performed using the standard protocol following bolus administration of intravenous contrast. CONTRAST:  119m OMNIPAQUE IOHEXOL 300 MG/ML  SOLN COMPARISON:  03/22/2016 FINDINGS: Lower chest: Lung bases are clear. No consolidation. No pleural effusion. Hepatobiliary: Lobular hepatic contours with fissural widening. Signs of low-density with linear pattern tracking into the RIGHT hepatic lobe. This is a vascular pattern in likely relates to prior portal vein thrombosis that was present in 2018. Post cholecystectomy without significant biliary ductal distension. Pancreas: Pancreas is normal. Spleen: Spleen is normal size without focal lesion. Adrenals/Urinary Tract: Adrenal glands are normal. Large RIGHT renal cyst is increased in size arising from lower pole without suspicious features measuring 12 x 11 cm as compared to 11 cm greatest axial dimension. There is no hydronephrosis. Multiple RIGHT renal lesions of variable density most reflecting hemorrhagic cysts. Some new however compared to exam dating back to 2014. The largest hyperdense lesion along the anterior margin measured 80 Hounsfield units in 2014 and is compatible with hemorrhagic cyst. Other small lesions higher density are indeterminate and potentially enhancing based on this  evaluation. Upper pole cyst on the LEFT density 6 Hounsfield units. No hydronephrosis. Urinary bladder is normal. Stomach/Bowel: Stomach and small bowel are normal. Positive contrast fills the gastrointestinal tract. Segmental thickening of the sigmoid colon with marked pericolonic stranding. Contrast mixed with gas along the posterior wall likely intramural sinus tract. (Image 78, series 3 through image 73, series 3 going from inferior to superior. Focal outpouching along the inferior margin of this area may also represent a focus of intramural sinus tract formation best seen on coronal images. Extensive pericolonic inflammation. Numerous lymph nodes within the colonic mesentery largest along the LEFT psoas muscle (image 67, series 3) 1.6 cm in short axis dimension. Thrombosis of the superior rectal vein and a portion of the IMV in this patient with history of prior portal thrombus suspicious for septic thrombophlebitis Vascular/Lymphatic: Normal caliber abdominal aorta. No additional adenopathy in the retroperitoneum or in the upper abdomen. No pelvic lymphadenopathy. Reproductive: Prostate is heterogeneous. Other: No sign of free air. Extensive sigmoid stranding, worse than on the previous exam. Musculoskeletal: No acute bone finding. No destructive bone process. IMPRESSION: 1. Signs of worsening acute on chronic inflammation, sinus tract formation in the wall of the colon and increased pericolonic stranding in this patient with ulcerative colitis. 2. 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 Inflammation within the colon likely leading to septic thrombophlebitis. 3. Given the marked colonic thickening the possibility of underlying neoplasm remains a consideration and is seen in the setting of increasing size of retroperitoneal lymph nodes. Follow-up colonoscopy is suggested. 4. Lobular hepatic contours raise the question of liver disease/fibrosis or cirrhosis. Laboratory and clinical  correlation may be helpful. Scarring in the liver from prior RIGHT portal  thrombosis. 5. Multiple RIGHT renal lesions of variable density most reflecting hemorrhagic cysts. Some new however compared to exam dating back to 2014. Other small lesions higher density are indeterminate and potentially enhancing based on this evaluation. Consider follow-up renal protocol CT or MRI on a nonemergent basis, within 3-6 months. Call is out to the referring provider to further discuss findings in the above case. Electronically Signed   By: Zetta Bills M.D.   On: 07/19/2019 17:30   Labs Reviewed  COMPREHENSIVE METABOLIC PANEL - Abnormal; Notable for the following components:      Result Value   Sodium 131 (*)    Potassium 3.0 (*)    CO2 21 (*)    Glucose, Bld 135 (*)    Calcium 8.2 (*)    Albumin 3.2 (*)    AST 58 (*)    ALT 51 (*)    All other components within normal limits  CBC - Abnormal; Notable for the following components:   WBC 3.7 (*)    All other components within normal limits  URINALYSIS, ROUTINE W REFLEX MICROSCOPIC - Abnormal; Notable for the following components:   Glucose, UA 100 (*)    Bilirubin Urine SMALL (*)    Protein, ur 100 (*)    All other components within normal limits  URINALYSIS, MICROSCOPIC (REFLEX) - Abnormal; Notable for the following components:   Bacteria, UA RARE (*)    All other components within normal limits  LIPASE, BLOOD    1810 consulted Dr. Buford Dresser and discussed findings.  He recommends patient be admitted by hospitalist service and he will consult tomorrow morning.  He recommends symptomatic treatment at this time with antiemetics and IV fluids and prefers to refrain from steroids at this time.  73 20 Consulted hospitalist, Dr. Waldron Labs, who agrees to admit.     Kem Parkinson, PA-C 07/19/19 1843    Milton Ferguson, MD 07/20/19 [phone]

## 2019-07-19 NOTE — ED Triage Notes (Signed)
Pt reports has had n/v since Saturday.  Reports woke up with fever.  Reports saw GI Tuesday and was told they thought it was viral or food poisoning.   Denies any diarrhea or abd pain.

## 2019-07-19 NOTE — ED Provider Notes (Signed)
[facility] EMERGENCY DEPARTMENT Provider Note   CSN: [id number] Arrival date & time: 07/19/19  7824     History Chief Complaint  Patient presents with  . Emesis    Brett Bright is a 59 y.o. male with a past medical history significant for ulcerative pancolitis, history of iron deficiency anemia secondary to GI bleeding presenting with nausea and vomiting which initially started 5 days ago.  He reports having a low-grade fever on the first day of symptoms.  After this he had a symptom-free day and then his symptoms began again.  He was seen by his GI specialist 2 days ago at which time it was felt he had a viral food poisoning infection.  He did eat at the [employer] the day before onset of symptoms, but was not suspicious for improperly cooked food.  He denies abdominal pain, also denies diarrhea but does endorse seeing small amounts of bright red blood mixed with his stool the past several days.  He denies grossly bloody stools.  He continues to have low-grade fever.  He states his symptoms are very similar to ulcerative colitis flares, stating he never has abdominal pain or distention with his UC.  He takes Lialda with no recent changes in doses.  Past surgical history is significant for cholecystectomy and appendectomy.  He denies dysuria or hematuria, also denies hematemesis.  The history is provided by the patient.       Past Medical History:  Diagnosis Date  . Ulcerative colitis with rectal bleeding ([place]) 07/2018    Patient Active Problem List   Diagnosis Date Noted  . Nausea with vomiting 07/17/2019  . Ulcerative pancolitis with rectal bleeding ([place])   . Iron deficiency anemia due to chronic blood loss   . Acute GI bleeding 08/15/2018  . Symptomatic anemia 08/15/2018  . AKI (acute kidney injury) ([place]) 08/15/2018  . Thrombocytosis ([employer]) 08/15/2018  . Epigastric pain   . Colitis   . Nausea vomiting and diarrhea   . Sepsis due to undetermined organism ([place])  03/22/2016  . Acute calculous cholecystitis 03/22/2016  . 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 Elevated LFTs 03/22/2016  . Hyponatremia 03/22/2016  . Hypokalemia 03/22/2016  . Acute blood loss anemia 03/22/2016  . Abnormal serum level of lipase 03/22/2016  . 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 APPENDECTOMY    . BIOPSY  08/16/2018   Procedure: BIOPSY;  Surgeon: Daneil Dolin, MD;  Location: [facility] ENDO SUITE;  Service: Endoscopy;;  . CHOLECYSTECTOMY    . COLONOSCOPY N/A 03/23/2016   Dr. Oneida Alar: Thrombosed external hemorrhoid, internal hemorrhoids, diverticulosis, segmental moderate inflammation sigmoid colon secondary due to colitis with benign biopsies.  Next colonoscopy 5 years due to poor prep right colon.  . COLONOSCOPY N/A 08/16/2018   Dr. Gala Romney: pan-ulcerative colitis, diverticulosis. Bx c/w UC  . ESOPHAGOGASTRODUODENOSCOPY N/A 03/23/2016   Dr. Oneida Alar: Gastritis, no H. pylori, multiple small sessile polyps biopsy consistent with fundic gland, mild duodenitis.  Marland Kitchen ESOPHAGOGASTRODUODENOSCOPY N/A 08/16/2018   Procedure: ESOPHAGOGASTRODUODENOSCOPY (EGD);  Surgeon: Daneil Dolin, MD;  Location: [facility];  Service: Endoscopy;  Laterality: N/A;  . VASECTOMY         Family History  Problem Relation Age of Onset  . Stroke Mother   . Cancer Maternal Grandfather        Unknown type of cancer  .  Heart attack Paternal Grandmother   . Cancer Maternal Aunt        Possibly lung cancer  . Cancer Paternal Uncle        Unknown type of cancer  . Cancer Paternal Uncle        Unknown type of cancer  . Colon cancer Neg Hx     Social History   Tobacco Use  . Smoking status: Never Smoker  . Smokeless tobacco: Never Used  Substance Use Topics  . Alcohol use: No  . Drug use: No    Home Medications Prior to Admission medications   Medication Sig  Start Date End Date Taking? Authorizing Provider  Ascorbic Acid (VITAMIN C PO) Take by mouth. Drops daily    [provider]  aspirin 325 MG tablet Take 325 mg by mouth as needed.    [provider]  FEROSUL 325 (65 Fe) MG tablet TAKE 1 TABLET BY MOUTH TWICE DAILY WITH A MEAL 05/22/19   Aliene Altes S, PA-C  mesalamine (LIALDA) 1.2 g EC tablet Take 4 tablets (4.8 g total) by mouth daily with breakfast. Patient taking differently: Take 2.4 g by mouth daily with breakfast.  09/13/18   Fields, Marga Melnick, MD  omeprazole (PRILOSEC) 20 MG capsule Take 20 mg by mouth daily.    [provider]  ondansetron (ZOFRAN ODT) 4 MG disintegrating tablet Take 1 tablet (4 mg total) by mouth every 6 (six) hours as needed for nausea or vomiting. 07/17/19   Carlis Stable, NP    Allergies    Patient has no known allergies.  Review of Systems   Review of Systems  Constitutional: Positive for fever.  HENT: Negative for congestion and sore throat.   Eyes: Negative.   Respiratory: Negative for chest tightness and shortness of breath.   Cardiovascular: Negative for chest pain.  Gastrointestinal: Positive for blood in stool, nausea and vomiting. Negative for abdominal distention and abdominal pain.  Genitourinary: Negative.   Musculoskeletal: Negative for arthralgias, joint swelling and neck pain.  Skin: Negative.  Negative for rash and wound.  Neurological: Negative for dizziness, weakness, light-headedness, numbness and headaches.  Psychiatric/Behavioral: Negative.     Physical Exam Updated Vital Signs BP 100/64   Pulse 95   Temp (!) 100.7 F (38.2 C) (Oral)   Resp 20   Ht 5' 9"  (1.753 m)   Wt 99.8 kg   SpO2 96%   BMI 32.49 kg/m   Physical Exam Vitals and nursing note reviewed.  Constitutional:      Appearance: He is well-developed.  HENT:     Head: Normocephalic and atraumatic.  Eyes:     Conjunctiva/sclera: Conjunctivae normal.  Cardiovascular:     Rate and Rhythm:  Normal rate and regular rhythm.     Heart sounds: Normal heart sounds.  Pulmonary:     Effort: Pulmonary effort is normal.     Breath sounds: Normal breath sounds. No wheezing.  Abdominal:     General: Bowel sounds are decreased. There is distension.     Palpations: Abdomen is soft.     Tenderness: There is no abdominal tenderness. There is no guarding or rebound.     Comments: Mild distention without increased tympany.  Decreased in lower quadrants.  Musculoskeletal:        General: Normal range of motion.     Cervical back: Normal range of motion.  Skin:    General: Skin is warm and dry.  Neurological:     Mental  Status: He is alert.     ED Results / Procedures / Treatments   Labs (all labs ordered are listed, but only abnormal results are displayed) Labs Reviewed  COMPREHENSIVE METABOLIC PANEL - Abnormal; Notable for the following components:      Result Value   Sodium 131 (*)    Potassium 3.0 (*)    CO2 21 (*)    Glucose, Bld 135 (*)    Calcium 8.2 (*)    Albumin 3.2 (*)    AST 58 (*)    ALT 51 (*)    All other components within normal limits  CBC - Abnormal; Notable for the following components:   WBC 3.7 (*)    All other components within normal limits  URINALYSIS, ROUTINE W REFLEX MICROSCOPIC - Abnormal; Notable for the following components:   Glucose, UA 100 (*)    Bilirubin Urine SMALL (*)    Protein, ur 100 (*)    All other components within normal limits  URINALYSIS, MICROSCOPIC (REFLEX) - Abnormal; Notable for the following components:   Bacteria, UA RARE (*)    All other components within normal limits  LIPASE, BLOOD    EKG None  Radiology No results found.  Procedures Procedures (including critical care time)  Medications Ordered in ED Medications  acetaminophen (TYLENOL) tablet 650 mg (650 mg Oral Given 07/19/19 0855)  sodium chloride 0.9 % bolus 1,000 mL (1,000 mLs Intravenous New Bag/Given 07/19/19 1422)  ondansetron (ZOFRAN) injection 4 mg  (4 mg Intravenous Given 07/19/19 1424)    ED Course  I have reviewed the triage vital signs and the nursing notes.  Pertinent labs & imaging results that were available during my care of the patient were reviewed by me and considered in my medical decision making (see chart for details).    MDM Rules/Calculators/A&P                      Pt with history of ulcerative colitis with symptoms suggestive of prior flares. Labs revealing a leukopenia at 3.7.  LFT's slight elevation but chronic finding .  Hypokalemia. Pending Ct imaging for further evaluation of suspected UC vs other source of sx. He does have diverticulosis per colonoscopy, possible diverticulitis as source of sx.  Nausea improved with zofran. Pt appears stable for discharge home but may need medication adjustment/ ? abx based on Ct findings.   Signed out to [employer] pending Ct imaging results and disposition.    Final Clinical Impression(s) / ED Diagnoses Final diagnoses:  Non-intractable vomiting with nausea, unspecified vomiting type  Hypokalemia    Rx / DC Orders ED Discharge Orders    None       Landis Martins 07/19/19 1625    Elnora Morrison, MD 07/20/19 1540

## 2019-07-20 DIAGNOSIS — R112 Nausea with vomiting, unspecified: Secondary | ICD-10-CM

## 2019-07-20 DIAGNOSIS — E876 Hypokalemia: Secondary | ICD-10-CM

## 2019-07-20 DIAGNOSIS — K51919 Ulcerative colitis, unspecified with unspecified complications: Secondary | ICD-10-CM

## 2019-07-20 LAB — COMPREHENSIVE METABOLIC PANEL
ALT: 42 U/L (ref 0–44)
AST: 42 U/L — ABNORMAL HIGH (ref 15–41)
Albumin: 2.6 g/dL — ABNORMAL LOW (ref 3.5–5.0)
Alkaline Phosphatase: 69 U/L (ref 38–126)
Anion gap: 8 (ref 5–15)
BUN: 15 mg/dL (ref 6–20)
CO2: 25 mmol/L (ref 22–32)
Calcium: 8.3 mg/dL — ABNORMAL LOW (ref 8.9–10.3)
Chloride: 101 mmol/L (ref 98–111)
Creatinine, Ser: 0.81 mg/dL (ref 0.61–1.24)
GFR calc Af Amer: >60 mL/min (ref >60)
GFR calc non Af Amer: >60 mL/min (ref >60)
Glucose, Bld: 122 mg/dL — ABNORMAL HIGH (ref 70–99)
Potassium: 4.1 mmol/L (ref 3.5–5.1)
Sodium: 134 mmol/L — ABNORMAL LOW (ref 135–145)
Total Bilirubin: 0.5 mg/dL (ref 0.3–1.2)
Total Protein: 6.7 g/dL (ref 6.5–8.1)

## 2019-07-20 LAB — CBC
HCT: 36.5 % — ABNORMAL LOW (ref 39.0–52.0)
Hemoglobin: 11.6 g/dL — ABNORMAL LOW (ref 13.0–17.0)
MCH: 26.8 pg (ref 26.0–34.0)
MCHC: 31.8 g/dL (ref 30.0–36.0)
MCV: 84.3 fL (ref 80.0–100.0)
Platelets: 159 10*3/uL (ref 150–400)
RBC: 4.33 MIL/uL (ref 4.22–5.81)
RDW: 13.2 % (ref 11.5–15.5)
WBC: 5.8 10*3/uL (ref 4.0–10.5)
nRBC: 0 % (ref 0.0–0.2)

## 2019-07-20 LAB — C-REACTIVE PROTEIN: CRP: 20.7 mg/dL — ABNORMAL HIGH (ref <1.0)

## 2019-07-20 MED ORDER — METHYLPREDNISOLONE SODIUM SUCC 40 MG IJ SOLR
20.0000 mg | Freq: Two times a day (BID) | INTRAMUSCULAR | Status: DC
  Filled 2019-07-20: qty 1

## 2019-07-20 MED ORDER — PREDNISONE 10 MG PO TABS
ORAL_TABLET | ORAL | 0 refills | Status: DC
Start: 2019-07-21 — End: 2019-08-24

## 2019-07-20 MED ORDER — MESALAMINE 1.2 G PO TBEC: 4.8000 g | DELAYED_RELEASE_TABLET | Freq: Every day | ORAL | 11 refills | Status: DC

## 2019-07-20 MED ORDER — ONDANSETRON 4 MG PO TBDP: 4.0000 mg | ORAL_TABLET | Freq: Four times a day (QID) | ORAL | 1 refills | Status: DC | PRN

## 2019-07-20 MED ORDER — FERROUS SULFATE 325 (65 FE) MG PO TABS: 325.0000 mg | ORAL_TABLET | Freq: Every day | ORAL | 3 refills | Status: DC

## 2019-07-20 NOTE — Progress Notes (Signed)
PROGRESS NOTE   Brett Bright  PPJ:[id number] DOB: 06/02/1960 DOA: 07/19/2019 PCP: Patient, No Pcp Per   Chief Complaint  Patient presents with  . Emesis    Brief Narrative:   59 y.o. male, with past medical history of ulcerative colitis, iron deficiency anemia, ulcerative colitis diagnosed last year, since then he has been on remission with mesalamine, patient presents to ED secondary to complaints of nausea and diarrhea, patient reports symptoms started on Saturday, nausea and diarrhea, he denies any vomiting, abdominal pain, he reports he had some low-grade temperature as well, he was seen by GI office 2 days ago for possible food poisoning, with no improvement of symptoms which prompted him to come to ED, he denies any chest pain, shortness of breath, abdominal pain, coffee-ground emesis or blood per rectum(his previous presentation was diarrhea with blood when he was diagnosed with ulcerative colitis).   Assessment & Plan:   Active Problems:   Ulcerative colitis ([place])   1. UC Flare - Pt seems to be responding to the IV steroids and supportive care.  Appreciate GI consult and assistance with management.  Please see GI consult notes and recommendations.  Continue current management. 2. Hypokalemia-this is been repleted we will continue to follow. 3. Hyponatremia-this is improving with IV fluid hydration. 4. Right renal lesions likely hemorrhagic cysts, follow-up renal protocol CT or MRI on a nonemergent basis within 3 to 6 months.   DVT prophylaxis: Lovenox Code Status: Full Family Communication: Updated patient at bedside who verbalizes understanding Disposition:   Status is: Inpatient  Remains inpatient appropriate because:Persistent severe electrolyte disturbances and IV treatments appropriate due to intensity of illness or inability to take PO   Dispo: The patient is from: Home              Anticipated d/c is to: Home              Anticipated d/c date is: 3 days               Patient currently is not medically stable to d/c.    Consultants:   GI  Procedures:    Antimicrobials:    Subjective: Pt says he is feeling better today.   Objective: Vitals:   07/20/19 0007 07/20/19 0359 07/20/19 0800 07/20/19 1200  BP: 102/66 100/67 99/68 104/70  Pulse: 92 79 85 81  Resp: 16 16 20 20   Temp: 99.1 F (37.3 C) 98.3 F (36.8 C) 98.5 F (36.9 C) 98.4 F (36.9 C)  TempSrc: Oral Oral Oral Oral  SpO2: 96% 96% 97% 99%  Weight:      Height:        Intake/Output Summary (Last 24 hours) at 07/20/2019 1406 Last data filed at 07/20/2019 1016 Gross per 24 hour  Intake 1064.66 ml  Output --  Net 1064.66 ml   Filed Weights   07/19/19 0845  Weight: 99.8 kg    Examination:  General exam: Appears calm and comfortable  Respiratory system: Clear to auscultation. Respiratory effort normal. Cardiovascular system: S1 & S2 heard, RRR. No JVD, murmurs, rubs, gallops or clicks. No pedal edema. Gastrointestinal system: Abdomen is nondistended, soft and nontender. No organomegaly or masses felt. Normal bowel sounds heard. Central nervous system: Alert and oriented. No focal neurological deficits. Extremities: Symmetric 5 x 5 power. Skin: No rashes, lesions or ulcers Psychiatry: Judgement and insight appear normal. Mood & affect appropriate.   Data Reviewed: I have personally reviewed following labs and imaging studies  CBC: Recent  Labs  Lab 07/19/19 0923 07/20/19 0445  WBC 3.7* 5.8  HGB 13.2 11.6*  HCT 40.0 36.5*  MCV 83.0 84.3  PLT 167 852    Basic Metabolic Panel: Recent Labs  Lab 07/19/19 0923 07/20/19 0445  NA 131* 134*  K 3.0* 4.1  CL 98 101  CO2 21* 25  GLUCOSE 135* 122*  BUN 19 15  CREATININE 0.87 0.81  CALCIUM 8.2* 8.3*    GFR: Estimated Creatinine Clearance: 115.7 mL/min (by C-G formula based on SCr of 0.81 mg/dL).  Liver Function Tests: Recent Labs  Lab 07/19/19 0923 07/20/19 0445  AST 58* 42*  ALT 51* 42  ALKPHOS 95  69  BILITOT 1.1 0.5  PROT 7.7 6.7  ALBUMIN 3.2* 2.6*    CBG: No results for input(s): GLUCAP in the last 168 hours.   Recent Results (from the past 240 hour(s))  Respiratory Panel by RT PCR (Flu A&B, Covid) - Nasopharyngeal Swab     Status: None   Collection Time: 07/19/19  6:16 PM   Specimen: Nasopharyngeal Swab  Result Value Ref Range Status   SARS Coronavirus 2 by RT PCR NEGATIVE NEGATIVE Final    Comment: (NOTE) SARS-CoV-2 target nucleic acids are NOT DETECTED. The SARS-CoV-2 RNA is generally detectable in upper respiratoy specimens during the acute phase of infection. The lowest concentration of SARS-CoV-2 viral copies this assay can detect is 131 copies/mL. A negative result does not preclude SARS-Cov-2 infection and should not be used as the sole basis for treatment or other patient management decisions. A negative result may occur with  improper specimen collection/handling, submission of specimen other than nasopharyngeal swab, presence of viral mutation(s) within the areas targeted by this assay, and inadequate number of viral copies (<131 copies/mL). A negative result must be combined with clinical observations, patient history, and epidemiological information. The expected result is Negative. Fact Sheet for Patients:  PinkCheek.be Fact Sheet for Healthcare Providers:  GravelBags.it This test is not yet ap proved or cleared by the [place] FDA and  has been authorized for detection and/or diagnosis of SARS-CoV-2 by FDA under an Emergency Use Authorization (EUA). This EUA will remain  in effect (meaning this test can be used) for the duration of the COVID-19 declaration under Section 564(b)(1) of the Act, 21 U.S.C. section 360bbb-3(b)(1), unless the authorization is terminated or revoked sooner.    Influenza A by PCR NEGATIVE NEGATIVE Final   Influenza B by PCR NEGATIVE NEGATIVE Final    Comment:  (NOTE) The Xpert Xpress SARS-CoV-2/FLU/RSV assay is intended as an aid in  the diagnosis of influenza from Nasopharyngeal swab specimens and  should not be used as a sole basis for treatment. Nasal washings and  aspirates are unacceptable for Xpert Xpress SARS-CoV-2/FLU/RSV  testing. Fact Sheet for Patients: PinkCheek.be Fact Sheet for Healthcare Providers: GravelBags.it This test is not yet approved or cleared by the [place] FDA and  has been authorized for detection and/or diagnosis of SARS-CoV-2 by  FDA under an Emergency Use Authorization (EUA). This EUA will remain  in effect (meaning this test can be used) for the duration of the  Covid-19 declaration under Section 564(b)(1) of the Act, 21  U.S.C. section 360bbb-3(b)(1), unless the authorization is  terminated or revoked. Performed at [facility], [address] [place], [place] [address]     Radiology Studies: CT ABDOMEN PELVIS W CONTRAST  Addendum Date: 07/19/2019   ADDENDUM REPORT: 07/19/2019 17:37 ADDENDUM: Critical Value/emergent results were called by telephone at the  time of interpretation on 07/19/2019 at 5:37 pm to provider Kem Parkinson, who verbally acknowledged these results. Electronically Signed   By: Zetta Bills M.D.   On: 07/19/2019 17:37   Result Date: 07/19/2019 CLINICAL DATA:  Abdominal abscess, infection suspected in the setting of ulcerative colitis EXAM: CT ABDOMEN AND PELVIS WITH CONTRAST TECHNIQUE: Multidetector CT imaging of the abdomen and pelvis was performed using the standard protocol following bolus administration of intravenous contrast. CONTRAST:  155m OMNIPAQUE IOHEXOL 300 MG/ML  SOLN COMPARISON:  03/22/2016 FINDINGS: Lower chest: Lung bases are clear. No consolidation. No pleural effusion. Hepatobiliary: Lobular hepatic contours with fissural widening. Signs of low-density with linear pattern tracking into the RIGHT hepatic lobe. This  is a vascular pattern in likely relates to prior portal vein thrombosis that was present in 2018. Post cholecystectomy without significant biliary ductal distension. Pancreas: Pancreas is normal. Spleen: Spleen is normal size without focal lesion. Adrenals/Urinary Tract: Adrenal glands are normal. Large RIGHT renal cyst is increased in size arising from lower pole without suspicious features measuring 12 x 11 cm as compared to 11 cm greatest axial dimension. There is no hydronephrosis. Multiple RIGHT renal lesions of variable density most reflecting hemorrhagic cysts. Some new however compared to exam dating back to 2014. The largest hyperdense lesion along the anterior margin measured 80 Hounsfield units in 2014 and is compatible with hemorrhagic cyst. Other small lesions higher density are indeterminate and potentially enhancing based on this evaluation. Upper pole cyst on the LEFT density 6 Hounsfield units. No hydronephrosis. Urinary bladder is normal. Stomach/Bowel: Stomach and small bowel are normal. Positive contrast fills the gastrointestinal tract. Segmental thickening of the sigmoid colon with marked pericolonic stranding. Contrast mixed with gas along the posterior wall likely intramural sinus tract. (Image 78, series 3 through image 73, series 3 going from inferior to superior. Focal outpouching along the inferior margin of this area may also represent a focus of intramural sinus tract formation best seen on coronal images. Extensive pericolonic inflammation. Numerous lymph nodes within the colonic mesentery largest along the LEFT psoas muscle (image 67, series 3) 1.6 cm in short axis dimension. Thrombosis of the superior rectal vein and a portion of the IMV in this patient with history of prior portal thrombus suspicious for septic thrombophlebitis Vascular/Lymphatic: Normal caliber abdominal aorta. No additional adenopathy in the retroperitoneum or in the upper abdomen. No pelvic lymphadenopathy.  Reproductive: Prostate is heterogeneous. Other: No sign of free air. Extensive sigmoid stranding, worse than on the previous exam. Musculoskeletal: No acute bone finding. No destructive bone process. IMPRESSION: 1. Signs of worsening acute on chronic inflammation, sinus tract formation in the wall of the colon and increased pericolonic stranding in this patient with ulcerative colitis. 2. 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 Inflammation within the colon likely leading to septic thrombophlebitis. 3. Given the marked colonic thickening the possibility of underlying neoplasm remains a consideration and is seen in the setting of increasing size of retroperitoneal lymph nodes. Follow-up colonoscopy is suggested. 4. Lobular hepatic contours raise the question of liver disease/fibrosis or cirrhosis. Laboratory and clinical correlation may be helpful. Scarring in the liver from prior RIGHT portal thrombosis. 5. Multiple RIGHT renal lesions of variable density most reflecting hemorrhagic cysts. Some new however compared to exam dating back to 2014. Other small lesions higher density are indeterminate and potentially enhancing based on this evaluation. Consider follow-up renal protocol CT or MRI on a nonemergent basis, within 3-6 months. Call is out to the  referring provider to further discuss findings in the above case. Electronically Signed: By: Zetta Bills M.D. On: 07/19/2019 17:30   Scheduled Meds: . enoxaparin (LOVENOX) injection  40 mg Subcutaneous QHS  . mesalamine  4.8 g Oral Q breakfast  . methylPREDNISolone (SOLU-MEDROL) injection  30 mg Intravenous Q12H  . pantoprazole  40 mg Oral Daily   Continuous Infusions: . lactated ringers 75 mL/hr at 07/20/19 0700     LOS: 1 day    Time spent: 25 mins    Hajer Dwyer Wynetta Emery, MD [facility]   To contact the attending provider between 7A-7P or the covering provider during after hours 7P-7A, please log into the web site  [email or web address] and access using universal [place] password for that web site. If you do not have the password, please call the hospital operator.  07/20/2019, 2:06 PM

## 2019-07-20 NOTE — Discharge Summary (Signed)
Physician Discharge Summary  Brett Bright NWG:[id number] DOB: Aug 13, 1960 DOA: 07/19/2019  GI: [facility]  Admit date: 07/19/2019 Discharge date: 07/20/2019  Admitted From:  Home  Disposition: Home   Recommendations for Outpatient Follow-up:  1. Follow up with PCP in 1 weeks 2. Follow up with GI in 2-3 weeks for recheck 3. Recommend outpatient renal protocol CT or MRI in 3-6 months to follow up renal cysts.    Discharge Condition: STABLE  CODE STATUS: FULL    Brief Hospitalization Summary: Please see all hospital notes, images, labs for full details of the hospitalization. Brief Narrative:  59 y.o.male,with past medical history of ulcerative colitis, iron deficiency anemia, ulcerative colitis diagnosed last year, since then he has been on remission with mesalamine, patient presents to ED secondary to complaints of nausea and diarrhea, patient reports symptoms started on Saturday, nausea and diarrhea, he denies any vomiting, abdominal pain, he reports he had some low-grade temperature as well, he was seen by GI office 2 days ago for possible food poisoning, with no improvement of symptoms which prompted him to come to ED, he denies any chest pain, shortness of breath, abdominal pain, coffee-ground emesis or blood per rectum(his previous presentation was diarrhea with blood when he was diagnosed with ulcerative colitis).   Assessment & Plan:   Active Problems:   Ulcerative colitis (HCC)   1. UC Flare - Symptoms much improved.  I spoke with Dr. Oneida Alar with GI team.  Pt's images are stable from 2018.  He can safely discharge home with oral prednisone taper, zofran for nausea and outpatient follow up with GI.  Pt seems to have responded well to the IV steroids and supportive care.  Appreciate GI consult and assistance with management. DC home with outpatient GI follow up.   2. Hypokalemia-this is been repleted. 3. Hyponatremia-this is improved with IV fluid hydration. 4. Right  renal lesions likely hemorrhagic cysts, follow-up renal protocol CT or MRI on a nonemergent basis within 3 to 6 months.   DVT prophylaxis: Lovenox Code Status: Full Family Communication: Updated patient at bedside who verbalizes understanding Disposition:   Discharge Diagnoses:  Active Problems:   Ulcerative colitis [facility])   Discharge Instructions:  Allergies as of 07/20/2019   No Known Allergies     Medication List    STOP taking these medications   aspirin 325 MG tablet     TAKE these medications   acetaminophen 325 MG tablet Commonly known as: TYLENOL Take 325 mg by mouth every 6 (six) hours as needed for fever.   ferrous sulfate 325 (65 FE) MG tablet Commonly known as: FeroSul Take 1 tablet (325 mg total) by mouth daily with breakfast. What changed:   medication strength  See the new instructions.   mesalamine 1.2 g EC tablet Commonly known as: LIALDA Take 4 tablets (4.8 g total) by mouth daily with breakfast. What changed: how much to take   omeprazole 20 MG capsule Commonly known as: PRILOSEC Take 20 mg by mouth daily.   ondansetron 4 MG disintegrating tablet Commonly known as: Zofran ODT Take 1 tablet (4 mg total) by mouth every 6 (six) hours as needed for nausea or vomiting.   predniSONE 10 MG tablet Commonly known as: DELTASONE Take 4 PO QAM x7days, 3 PO QAM x7days, 2 PO QAM x7days, 1 po QAM x 7 Start taking on: Jul 21, 2019   VITAMIN C PO Take by mouth. Drops daily      Follow-up Information    [facility]. Schedule an appointment as soon as possible for a visit in 2 week(s).   Why: Hospital Follow Up Contact information: [address] [place] [phone]         No Known Allergies Allergies as of 07/20/2019   No Known Allergies     Medication List    STOP taking these medications   aspirin 325 MG tablet     TAKE these medications   acetaminophen 325 MG tablet Commonly  known as: TYLENOL Take 325 mg by mouth every 6 (six) hours as needed for fever.   ferrous sulfate 325 (65 FE) MG tablet Commonly known as: FeroSul Take 1 tablet (325 mg total) by mouth daily with breakfast. What changed:   medication strength  See the new instructions.   mesalamine 1.2 g EC tablet Commonly known as: LIALDA Take 4 tablets (4.8 g total) by mouth daily with breakfast. What changed: how much to take   omeprazole 20 MG capsule Commonly known as: PRILOSEC Take 20 mg by mouth daily.   ondansetron 4 MG disintegrating tablet Commonly known as: Zofran ODT Take 1 tablet (4 mg total) by mouth every 6 (six) hours as needed for nausea or vomiting.   predniSONE 10 MG tablet Commonly known as: DELTASONE Take 4 PO QAM x7days, 3 PO QAM x7days, 2 PO QAM x7days, 1 po QAM x 7 Start taking on: Jul 21, 2019   VITAMIN C PO Take by mouth. Drops daily       Procedures/Studies: CT ABDOMEN PELVIS W CONTRAST  Addendum Date: 07/19/2019   ADDENDUM REPORT: 07/19/2019 17:37 ADDENDUM: Critical Value/emergent results were called by telephone at the time of interpretation on 07/19/2019 at 5:37 pm to provider Kem Parkinson, who verbally acknowledged these results. Electronically Signed   By: Zetta Bills M.D.   On: 07/19/2019 17:37   Result Date: 07/19/2019 CLINICAL DATA:  Abdominal abscess, infection suspected in the setting of ulcerative colitis EXAM: CT ABDOMEN AND PELVIS WITH CONTRAST TECHNIQUE: Multidetector CT imaging of the abdomen and pelvis was performed using the standard protocol following bolus administration of intravenous contrast. CONTRAST:  167m OMNIPAQUE IOHEXOL 300 MG/ML  SOLN COMPARISON:  03/22/2016 FINDINGS: Lower chest: Lung bases are clear. No consolidation. No pleural effusion. Hepatobiliary: Lobular hepatic contours with fissural widening. Signs of low-density with linear pattern tracking into the RIGHT hepatic lobe. This is a vascular pattern in likely relates to prior  portal vein thrombosis that was present in 2018. Post cholecystectomy without significant biliary ductal distension. Pancreas: Pancreas is normal. Spleen: Spleen is normal size without focal lesion. Adrenals/Urinary Tract: Adrenal glands are normal. Large RIGHT renal cyst is increased in size arising from lower pole without suspicious features measuring 12 x 11 cm as compared to 11 cm greatest axial dimension. There is no hydronephrosis. Multiple RIGHT renal lesions of variable density most reflecting hemorrhagic cysts. Some new however compared to exam dating back to 2014. The largest hyperdense lesion along the anterior margin measured 80 Hounsfield units in 2014 and is compatible with hemorrhagic cyst. Other small lesions higher density are indeterminate and potentially enhancing based on this evaluation. Upper pole cyst on the LEFT density 6 Hounsfield units. No hydronephrosis. Urinary bladder is normal. Stomach/Bowel: Stomach and small bowel are normal. Positive contrast fills the gastrointestinal tract. Segmental thickening of the sigmoid colon with marked pericolonic stranding. Contrast mixed with gas along the posterior wall likely intramural sinus tract. (Image 78, series 3 through image 73, series 3 going from inferior  to superior. Focal outpouching along the inferior margin of this area may also represent a focus of intramural sinus tract formation best seen on coronal images. Extensive pericolonic inflammation. Numerous lymph nodes within the colonic mesentery largest along the LEFT psoas muscle (image 67, series 3) 1.6 cm in short axis dimension. Thrombosis of the superior rectal vein and a portion of the IMV in this patient with history of prior portal thrombus suspicious for septic thrombophlebitis Vascular/Lymphatic: Normal caliber abdominal aorta. No additional adenopathy in the retroperitoneum or in the upper abdomen. No pelvic lymphadenopathy. Reproductive: Prostate is heterogeneous. Other: No  sign of free air. Extensive sigmoid stranding, worse than on the previous exam. Musculoskeletal: No acute bone finding. No destructive bone process. IMPRESSION: 1. Signs of worsening acute on chronic inflammation, sinus tract formation in the wall of the colon and increased pericolonic stranding in this patient with ulcerative colitis. 2. 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 Inflammation within the colon likely leading to septic thrombophlebitis. 3. Given the marked colonic thickening the possibility of underlying neoplasm remains a consideration and is seen in the setting of increasing size of retroperitoneal lymph nodes. Follow-up colonoscopy is suggested. 4. Lobular hepatic contours raise the question of liver disease/fibrosis or cirrhosis. Laboratory and clinical correlation may be helpful. Scarring in the liver from prior RIGHT portal thrombosis. 5. Multiple RIGHT renal lesions of variable density most reflecting hemorrhagic cysts. Some new however compared to exam dating back to 2014. Other small lesions higher density are indeterminate and potentially enhancing based on this evaluation. Consider follow-up renal protocol CT or MRI on a nonemergent basis, within 3-6 months. Call is out to the referring provider to further discuss findings in the above case. Electronically Signed: By: Zetta Bills M.D. On: 07/19/2019 17:30      Subjective: Pt says nausea is much better controlled, he is not having any abdominal pain.  He was seen by GI and he can go home.  He would like to go home.    Discharge Exam: Vitals:   07/20/19 1200 07/20/19 1610  BP: 104/70 110/73  Pulse: 81 83  Resp: 20 19  Temp: 98.4 F (36.9 C) 98.5 F (36.9 C)  SpO2: 99% 98%   Vitals:   07/20/19 0359 07/20/19 0800 07/20/19 1200 07/20/19 1610  BP: 100/67 99/68 104/70 110/73  Pulse: 79 85 81 83  Resp: 16 20 20 19   Temp: 98.3 F (36.8 C) 98.5 F (36.9 C) 98.4 F (36.9 C) 98.5 F (36.9 C)  TempSrc:  Oral Oral Oral Oral  SpO2: 96% 97% 99% 98%  Weight:      Height:        General: Pt is alert, awake, not in acute distress Cardiovascular: RRR, S1/S2 +, no rubs, no gallops Respiratory: CTA bilaterally, no wheezing, no rhonchi Abdominal: Soft, NT, ND, bowel sounds + Extremities: no edema, no cyanosis   The results of significant diagnostics from this hospitalization (including imaging, microbiology, ancillary and laboratory) are listed below for reference.     Microbiology: Recent Results (from the past 240 hour(s))  Respiratory Panel by RT PCR (Flu A&B, Covid) - Nasopharyngeal Swab     Status: None   Collection Time: 07/19/19  6:16 PM   Specimen: Nasopharyngeal Swab  Result Value Ref Range Status   SARS Coronavirus 2 by RT PCR NEGATIVE NEGATIVE Final    Comment: (NOTE) SARS-CoV-2 target nucleic acids are NOT DETECTED. The SARS-CoV-2 RNA is generally detectable in upper respiratoy specimens during the  acute phase of infection. The lowest concentration of SARS-CoV-2 viral copies this assay can detect is 131 copies/mL. A negative result does not preclude SARS-Cov-2 infection and should not be used as the sole basis for treatment or other patient management decisions. A negative result may occur with  improper specimen collection/handling, submission of specimen other than nasopharyngeal swab, presence of viral mutation(s) within the areas targeted by this assay, and inadequate number of viral copies (<131 copies/mL). A negative result must be combined with clinical observations, patient history, and epidemiological information. The expected result is Negative. Fact Sheet for Patients:  PinkCheek.be Fact Sheet for Healthcare Providers:  GravelBags.it This test is not yet ap proved or cleared by the [place] FDA and  has been authorized for detection and/or diagnosis of SARS-CoV-2 by FDA under an Emergency Use  Authorization (EUA). This EUA will remain  in effect (meaning this test can be used) for the duration of the COVID-19 declaration under Section 564(b)(1) of the Act, 21 U.S.C. section 360bbb-3(b)(1), unless the authorization is terminated or revoked sooner.    Influenza A by PCR NEGATIVE NEGATIVE Final   Influenza B by PCR NEGATIVE NEGATIVE Final    Comment: (NOTE) The Xpert Xpress SARS-CoV-2/FLU/RSV assay is intended as an aid in  the diagnosis of influenza from Nasopharyngeal swab specimens and  should not be used as a sole basis for treatment. Nasal washings and  aspirates are unacceptable for Xpert Xpress SARS-CoV-2/FLU/RSV  testing. Fact Sheet for Patients: PinkCheek.be Fact Sheet for Healthcare Providers: GravelBags.it This test is not yet approved or cleared by the [place] FDA and  has been authorized for detection and/or diagnosis of SARS-CoV-2 by  FDA under an Emergency Use Authorization (EUA). This EUA will remain  in effect (meaning this test can be used) for the duration of the  Covid-19 declaration under Section 564(b)(1) of the Act, 21  U.S.C. section 360bbb-3(b)(1), unless the authorization is  terminated or revoked. Performed at [facility], [address] [place], [place] [address]      Labs: BNP (last 3 results) No results for input(s): BNP in the last 8760 hours. Basic Metabolic Panel: Recent Labs  Lab 07/19/19 0923 07/20/19 0445  NA 131* 134*  K 3.0* 4.1  CL 98 101  CO2 21* 25  GLUCOSE 135* 122*  BUN 19 15  CREATININE 0.87 0.81  CALCIUM 8.2* 8.3*   Liver Function Tests: Recent Labs  Lab 07/19/19 0923 07/20/19 0445  AST 58* 42*  ALT 51* 42  ALKPHOS 95 69  BILITOT 1.1 0.5  PROT 7.7 6.7  ALBUMIN 3.2* 2.6*   Recent Labs  Lab 07/19/19 0923  LIPASE 44   No results for input(s): AMMONIA in the last 168 hours. CBC: Recent Labs  Lab 07/19/19 0923 07/20/19 0445  WBC 3.7* 5.8   HGB 13.2 11.6*  HCT 40.0 36.5*  MCV 83.0 84.3  PLT 167 159   Cardiac Enzymes: No results for input(s): CKTOTAL, CKMB, CKMBINDEX, TROPONINI in the last 168 hours. BNP: Invalid input(s): POCBNP CBG: No results for input(s): GLUCAP in the last 168 hours. D-Dimer No results for input(s): DDIMER in the last 72 hours. Hgb A1c No results for input(s): HGBA1C in the last 72 hours. Lipid Profile No results for input(s): CHOL, HDL, LDLCALC, TRIG, CHOLHDL, LDLDIRECT in the last 72 hours. Thyroid function studies No results for input(s): TSH, T4TOTAL, T3FREE, THYROIDAB in the last 72 hours.  Invalid input(s): FREET3 Anemia work up No results for input(s): VITAMINB12, FOLATE,  FERRITIN, TIBC, IRON, RETICCTPCT in the last 72 hours. Urinalysis    Component Value Date/Time   COLORURINE YELLOW 07/19/2019 [place] 07/19/2019 0955   LABSPEC 1.020 07/19/2019 0955   PHURINE 6.0 07/19/2019 0955   GLUCOSEU 100 (A) 07/19/2019 0955   HGBUR NEGATIVE 07/19/2019 0955   BILIRUBINUR SMALL (A) 07/19/2019 0955   KETONESUR NEGATIVE 07/19/2019 0955   PROTEINUR 100 (A) 07/19/2019 0955   UROBILINOGEN 0.2 08/20/2012 0923   NITRITE NEGATIVE 07/19/2019 0955   LEUKOCYTESUR NEGATIVE 07/19/2019 0955   Sepsis Labs Invalid input(s): PROCALCITONIN,  WBC,  LACTICIDVEN Microbiology Recent Results (from the past 240 hour(s))  Respiratory Panel by RT PCR (Flu A&B, Covid) - Nasopharyngeal Swab     Status: None   Collection Time: 07/19/19  6:16 PM   Specimen: Nasopharyngeal Swab  Result Value Ref Range Status   SARS Coronavirus 2 by RT PCR NEGATIVE NEGATIVE Final    Comment: (NOTE) SARS-CoV-2 target nucleic acids are NOT DETECTED. The SARS-CoV-2 RNA is generally detectable in upper respiratoy specimens during the acute phase of infection. The lowest concentration of SARS-CoV-2 viral copies this assay can detect is 131 copies/mL. A negative result does not preclude SARS-Cov-2 infection and should  not be used as the sole basis for treatment or other patient management decisions. A negative result may occur with  improper specimen collection/handling, submission of specimen other than nasopharyngeal swab, presence of viral mutation(s) within the areas targeted by this assay, and inadequate number of viral copies (<131 copies/mL). A negative result must be combined with clinical observations, patient history, and epidemiological information. The expected result is Negative. Fact Sheet for Patients:  PinkCheek.be Fact Sheet for Healthcare Providers:  GravelBags.it This test is not yet ap proved or cleared by the [place] FDA and  has been authorized for detection and/or diagnosis of SARS-CoV-2 by FDA under an Emergency Use Authorization (EUA). This EUA will remain  in effect (meaning this test can be used) for the duration of the COVID-19 declaration under Section 564(b)(1) of the Act, 21 U.S.C. section 360bbb-3(b)(1), unless the authorization is terminated or revoked sooner.    Influenza A by PCR NEGATIVE NEGATIVE Final   Influenza B by PCR NEGATIVE NEGATIVE Final    Comment: (NOTE) The Xpert Xpress SARS-CoV-2/FLU/RSV assay is intended as an aid in  the diagnosis of influenza from Nasopharyngeal swab specimens and  should not be used as a sole basis for treatment. Nasal washings and  aspirates are unacceptable for Xpert Xpress SARS-CoV-2/FLU/RSV  testing. Fact Sheet for Patients: PinkCheek.be Fact Sheet for Healthcare Providers: GravelBags.it This test is not yet approved or cleared by the [place] FDA and  has been authorized for detection and/or diagnosis of SARS-CoV-2 by  FDA under an Emergency Use Authorization (EUA). This EUA will remain  in effect (meaning this test can be used) for the duration of the  Covid-19 declaration under Section 564(b)(1)  of the Act, 21  U.S.C. section 360bbb-3(b)(1), unless the authorization is  terminated or revoked. Performed at [facility], [address], [place],  [address]     Time coordinating discharge:   SIGNED:  Irwin Brakeman, MD  [facility] 07/20/2019, 5:54 PM How to contact the [facility] Attending or Consulting provider [place] or covering provider during after hours [place], for this patient?  1. Check the care team in [facility] and look for a) attending/consulting [facility] provider listed and b) the [facility] team listed 2. Log into [email or web address] and use  [facility] universal password to access. If you do not have the password, please contact the hospital operator. 3. Locate the [facility] provider you are looking for under [facility] and page to a number that you can be directly reached. 4. If you still have difficulty reaching the provider, please page the [facility] (Director on Call) for the Hospitalists listed on amion for assistance.

## 2019-07-20 NOTE — Consult Note (Addendum)
Referring Provider: [facility] Primary Care Physician:  Patient, No Pcp Per Primary Gastroenterologist:  Dr. Oneida Alar  Date of Admission: 07/19/19 Date of Consultation: 07/20/19  Reason for Consultation:  Colitis, N/V, diarrhea  HPI:  Brett Bright is a 59 y.o. male with a past medical history of ulcerative colitis diagnosed by our office in 2019.  At the time of diagnosis he was hospitalized with weight loss, vomiting, diarrhea, bright red blood per rectum and noted profound iron deficiency anemia with a hemoglobin of 6.9 ferritin of 5.  He has been treated with Lialda 4.8 g daily which was backed down to 2.4 g daily and generally doing well.  The patient was just recently seen in our office 07/17/2019 for complaints of nausea and vomiting for the previous 2 to 3 days.  He began having nausea and bilious vomiting as well as low-grade fever that began Friday night after eating a cheeseburger at the [employer] which she states "was probably a mistake" because he does not generally eat greasy foods.  No sick contacts or travel at that time.  He specifically denied abdominal pain, diarrhea, hematochezia, weight loss.  His low-grade fever resolved a day after his symptoms began.  Overall impression was possible food poisoning versus viral gastroenteritis.  Recommended supportive care including Zofran, push fluids, if unable to tolerate intake for 24 hours proceed to the emergency room, recommended our office check on the patient Thursday.  Requested short interval follow-up in 2 to 4 weeks.  The patient presented to the emergency department yesterday with complaints of recurrent low-grade fever.  In the ER he denied abdominal pain and diarrhea but did endorse seeing a small amount of bright red blood mixed in his stools for the past several days, but no grossly bloody stools.  He told ER provider that his symptoms currently are typical for him for UC flares.  Labs found leukopenia at 3.7,  LFTs with slight elevation but noted to be chronic.  Also noted hypokalemia.  Previously noted diverticulosis.  His nausea did improve with Zofran.  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  Given marked colonic thickening underlying neoplasm cannot be ruled out.  Also noted increasing size of retroperitoneal lymph nodes.  Lobular hepatic contour query liver fibrosis/cirrhosis.  Labs in the ED found normal lipase, electrolyte derangements with sodium of 131, calcium 3.0, creatinine normal.  AST/ALT of 58/51, alk phos and bilirubin normal.  Hemoglobin normal at 13.2, platelet count low normal at 167.  Respiratory panel negative.  The case was discussed with GI on-call who recommended IV Solu-Medrol, clinically diet, fluids.  No anticoagulation given IV a small vein and reassess in the morning.  Labs this morning show drifting hemoglobin to 11.6, possible delusional component.  Platelets remain low normal at 159.  Leukopenia resolved and now 5.8.  Improvement in electrolytes including sodium and potassium.  LFTs improved with AST mildly elevated at 42, ALT, alkaline phosphatase, and bilirubin all remain normal.  Today he states he's feeling much better. He feels diarrhea was from oral contrast, no episodes since the first one yesterday. Did see a small amount of blood in his stool yetserday. Still no abdominal pain. N/V is significantly improved. Is wondering if he could possibly go home today. Tolerating clear liquids. No other GI complaints.  Past Medical History:  Diagnosis Date  . Ulcerative colitis with rectal bleeding ([place]) 07/2018    Past Surgical History:  Procedure Laterality Date  . APPENDECTOMY    . BIOPSY  08/16/2018   Procedure: BIOPSY;  Surgeon:  Daneil Dolin, MD;  Location: [facility];  Service: Endoscopy;;  . CHOLECYSTECTOMY    . COLONOSCOPY N/A 03/23/2016   Dr. Oneida Alar: Thrombosed external hemorrhoid, internal hemorrhoids, diverticulosis, segmental moderate inflammation sigmoid colon secondary due to colitis with benign biopsies.  Next colonoscopy 5 years due to poor prep right colon.  . COLONOSCOPY N/A 08/16/2018   Dr. Gala Romney: pan-ulcerative colitis, diverticulosis. Bx c/w UC  . ESOPHAGOGASTRODUODENOSCOPY N/A 03/23/2016   Dr. Oneida Alar: Gastritis, no H. pylori, multiple small sessile polyps biopsy consistent with fundic gland, mild duodenitis.  Marland Kitchen ESOPHAGOGASTRODUODENOSCOPY N/A 08/16/2018   Procedure: ESOPHAGOGASTRODUODENOSCOPY (EGD);  Surgeon: Daneil Dolin, MD;  Location: [facility];  Service: Endoscopy;  Laterality: N/A;  . VASECTOMY      Prior to Admission medications   Medication Sig Start Date End Date Taking? Authorizing Provider  Ascorbic Acid (VITAMIN C PO) Take by mouth. Drops daily    [provider]  aspirin 325 MG tablet Take 325 mg by mouth as needed.    [provider]  FEROSUL 325 (65 Fe) MG tablet TAKE 1 TABLET BY MOUTH TWICE DAILY WITH A MEAL 05/22/19   Aliene Altes S, PA-C  mesalamine (LIALDA) 1.2 g EC tablet Take 4 tablets (4.8 g total) by mouth daily with breakfast. Patient taking differently: Take 2.4 g by mouth daily with breakfast.  09/13/18   Fields, Marga Melnick, MD  omeprazole (PRILOSEC) 20 MG capsule Take 20 mg by mouth daily.    [provider]  ondansetron (ZOFRAN ODT) 4 MG disintegrating tablet Take 1 tablet (4 mg total) by mouth every 6 (six) hours as needed for nausea or vomiting. 07/17/19   Carlis Stable, NP    Current Facility-Administered Medications  Medication Dose Route Frequency Provider Last Rate Last Admin  . acetaminophen (TYLENOL) tablet 650 mg  650 mg Oral Q6H PRN Elgergawy, Silver Huguenin, MD       Or  . acetaminophen (TYLENOL) suppository 650 mg  650 mg Rectal Q6H PRN  Elgergawy, Silver Huguenin, MD      . enoxaparin (LOVENOX) injection 40 mg  40 mg Subcutaneous QHS Elgergawy, Silver Huguenin, MD   40 mg at 07/20/19 0001  . lactated ringers infusion   Intravenous Continuous Elgergawy, Silver Huguenin, MD 75 mL/hr at 07/20/19 0700 Rate Verify at 07/20/19 0700  . mesalamine (LIALDA) EC tablet 4.8 g  4.8 g Oral Q breakfast Elgergawy, Silver Huguenin, MD      . methylPREDNISolone sodium succinate (SOLU-MEDROL) 40 mg/mL injection 30 mg  30 mg Intravenous Q12H Elgergawy, Silver Huguenin, MD   30 mg at 07/19/19 2350  . ondansetron (ZOFRAN) tablet 4 mg  4 mg Oral Q6H PRN Elgergawy, Silver Huguenin, MD       Or  . ondansetron (ZOFRAN) injection 4 mg  4 mg Intravenous Q6H PRN Elgergawy, Silver Huguenin, MD   4 mg at 07/19/19 2356  . pantoprazole (PROTONIX) EC tablet 40 mg  40 mg Oral Daily Elgergawy, Silver Huguenin, MD        Allergies as of 07/19/2019  . (No Known Allergies)    Family History  Problem Relation Age of Onset  . Stroke Mother   . Cancer Maternal Grandfather        Unknown  type of cancer  . Heart attack Paternal Grandmother   . Cancer Maternal Aunt        Possibly lung cancer  . Cancer Paternal Uncle        Unknown type of cancer  . Cancer Paternal Uncle        Unknown type of cancer  . Colon cancer Neg Hx     Social History   Socioeconomic History  . Marital status: Married    Spouse name: Not on file  . Number of children: 3  . Years of education: Not on file  . Highest education level: Not on file  Occupational History  . Occupation: Primary school teacher    Comment: At [employer]   Tobacco Use  . Smoking status: Never Smoker  . Smokeless tobacco: Never Used  Substance and Sexual Activity  . Alcohol use: No  . Drug use: No  . Sexual activity: Not on file  Other Topics Concern  . 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   He is married and has 3 children, who are ages 15, 61, and 20 as of 2017.   The 22 and  65 year old children are still living at home, but the 58 yo is married and lives elsewhere.   The patient lives in [place], [place].      His primary care is [facility] in [place], [place].   Social Determinants of Health   Financial Resource Strain:   . Difficulty of Paying Living Expenses:   Food Insecurity:   . Worried About Charity fundraiser in the Last Year:   . Arboriculturist in the Last Year:   Transportation Needs:   . Film/video editor (Medical):   Marland Kitchen Lack of Transportation (Non-Medical):   Physical Activity:   . Days of Exercise per Week:   . Minutes of Exercise per Session:   Stress:   . Feeling of Stress :   Social Connections:   . Frequency of Communication with Friends and Family:   . Frequency of Social Gatherings with Friends and Family:   . Attends Religious Services:   . Active Member of Clubs or Organizations:   . Attends Archivist Meetings:   Marland Kitchen Marital Status:   Intimate Partner Violence:   . Fear of Current or Ex-Partner:   . Emotionally Abused:   Marland Kitchen Physically Abused:   . Sexually Abused:     Review of Systems: General: Negative for anorexia, weight loss, fever, chills, fatigue, weakness. ENT: Negative for hoarseness, nasal congestion. CV: Negative for chest pain, angina, palpitations, peripheral edema.  Respiratory: Negative for dyspnea at rest, dyspnea on exertion, cough, sputum, wheezing.  GI: See history of present illness. GU:  Negative for dysuria, hematuria, urinary incontinence, urinary frequency, nocturnal urination.  Heme: Negative for bruising or bleeding. Allergy: Negative for rash or hives.  Physical Exam: Vital signs in last 24 hours: Temp:  [98.3 F (36.8 C)-99.8 F (37.7 C)] 98.5 F (36.9 C) (04/30 0800) Pulse Rate:  [79-95] 85 (04/30 0800) Resp:  [16-20] 20 (04/30 0800) BP: (91-102)/(56-68) 99/68 (04/30 0800) SpO2:  [94 %-98 %] 97 % (04/30 0800) Last BM Date: 07/19/19 General:   Alert,  Well-developed,  well-nourished, pleasant and cooperative in NAD Head:  Normocephalic and atraumatic. Eyes:  Sclera clear, no icterus. Conjunctiva pink. Ears:  Normal auditory acuity. Neck:  Supple; no  masses or thyromegaly. Lungs:  Clear throughout to auscultation.  No wheezes, crackles, or rhonchi. No acute distress. Heart:  Regular rate and rhythm; no murmurs, clicks, rubs,  or gallops. Abdomen:  Soft, nontender and nondistended. No masses, hepatosplenomegaly or hernias noted. Normal bowel sounds, without guarding, and without rebound.   Rectal:  Deferred.   Msk:  Symmetrical without gross deformities. Pulses:  Normal bilateral DP pulses noted. Extremities:  Without clubbing or edema. Neurologic:  Alert and  oriented x4;  grossly normal neurologically. Psych:  Alert and cooperative. Normal mood and affect.  Intake/Output from previous day: 04/29 0701 - 04/30 0700 In: 824.7 [P.O.:300; I.V.:524.7] Out: -  Intake/Output this shift: No intake/output data recorded.  Lab Results: Recent Labs    07/19/19 0923 07/20/19 0445  WBC 3.7* 5.8  HGB 13.2 11.6*  HCT 40.0 36.5*  PLT 167 159   BMET Recent Labs    07/19/19 0923 07/20/19 0445  NA 131* 134*  K 3.0* 4.1  CL 98 101  CO2 21* 25  GLUCOSE 135* 122*  BUN 19 15  CREATININE 0.87 0.81  CALCIUM 8.2* 8.3*   LFT Recent Labs    07/19/19 0923 07/20/19 0445  PROT 7.7 6.7  ALBUMIN 3.2* 2.6*  AST 58* 42*  ALT 51* 42  ALKPHOS 95 69  BILITOT 1.1 0.5   PT/INR No results for input(s): LABPROT, INR in the last 72 hours. Hepatitis Panel No results for input(s): HEPBSAG, HCVAB, HEPAIGM, HEPBIGM in the last 72 hours. C-Diff No results for input(s): CDIFFTOX in the last 72 hours.  Studies/Results: CT ABDOMEN PELVIS W CONTRAST  Addendum Date: 07/19/2019   ADDENDUM REPORT: 07/19/2019 17:37 ADDENDUM: Critical Value/emergent results were called by telephone at the time of interpretation on 07/19/2019 at 5:37 pm to provider Kem Parkinson, who  verbally acknowledged these results. Electronically Signed   By: Zetta Bills M.D.   On: 07/19/2019 17:37   Result Date: 07/19/2019 CLINICAL DATA:  Abdominal abscess, infection suspected in the setting of ulcerative colitis EXAM: CT ABDOMEN AND PELVIS WITH CONTRAST TECHNIQUE: Multidetector CT imaging of the abdomen and pelvis was performed using the standard protocol following bolus administration of intravenous contrast. CONTRAST:  179m OMNIPAQUE IOHEXOL 300 MG/ML  SOLN COMPARISON:  03/22/2016 FINDINGS: Lower chest: Lung bases are clear. No consolidation. No pleural effusion. Hepatobiliary: Lobular hepatic contours with fissural widening. Signs of low-density with linear pattern tracking into the RIGHT hepatic lobe. This is a vascular pattern in likely relates to prior portal vein thrombosis that was present in 2018. Post cholecystectomy without significant biliary ductal distension. Pancreas: Pancreas is normal. Spleen: Spleen is normal size without focal lesion. Adrenals/Urinary Tract: Adrenal glands are normal. Large RIGHT renal cyst is increased in size arising from lower pole without suspicious features measuring 12 x 11 cm as compared to 11 cm greatest axial dimension. There is no hydronephrosis. Multiple RIGHT renal lesions of variable density most reflecting hemorrhagic cysts. Some new however compared to exam dating back to 2014. The largest hyperdense lesion along the anterior margin measured 80 Hounsfield units in 2014 and is compatible with hemorrhagic cyst. Other small lesions higher density are indeterminate and potentially enhancing based on this evaluation. Upper pole cyst on the LEFT density 6 Hounsfield units. No hydronephrosis. Urinary bladder is normal. Stomach/Bowel: Stomach and small bowel are normal. Positive contrast fills the gastrointestinal tract. Segmental thickening of the sigmoid colon with marked pericolonic stranding. Contrast mixed with gas along the posterior wall likely  intramural sinus tract. (Image  78, series 3 through image 73, series 3 going from inferior to superior. Focal outpouching along the inferior margin of this area may also represent a focus of intramural sinus tract formation best seen on coronal images. Extensive pericolonic inflammation. Numerous lymph nodes within the colonic mesentery largest along the LEFT psoas muscle (image 67, series 3) 1.6 cm in short axis dimension. Thrombosis of the superior rectal vein and a portion of the IMV in this patient with history of prior portal thrombus suspicious for septic thrombophlebitis Vascular/Lymphatic: Normal caliber abdominal aorta. No additional adenopathy in the retroperitoneum or in the upper abdomen. No pelvic lymphadenopathy. Reproductive: Prostate is heterogeneous. Other: No sign of free air. Extensive sigmoid stranding, worse than on the previous exam. Musculoskeletal: No acute bone finding. No destructive bone process. IMPRESSION: 1. Signs of worsening acute on chronic inflammation, sinus tract formation in the wall of the colon and increased pericolonic stranding in this patient with ulcerative colitis. 2. 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 Inflammation within the colon likely leading to septic thrombophlebitis. 3. Given the marked colonic thickening the possibility of underlying neoplasm remains a consideration and is seen in the setting of increasing size of retroperitoneal lymph nodes. Follow-up colonoscopy is suggested. 4. Lobular hepatic contours raise the question of liver disease/fibrosis or cirrhosis. Laboratory and clinical correlation may be helpful. Scarring in the liver from prior RIGHT portal thrombosis. 5. Multiple RIGHT renal lesions of variable density most reflecting hemorrhagic cysts. Some new however compared to exam dating back to 2014. Other small lesions higher density are indeterminate and potentially enhancing based on this evaluation. Consider follow-up renal  protocol CT or MRI on a nonemergent basis, within 3-6 months. Call is out to the referring provider to further discuss findings in the above case. Electronically Signed: By: Zetta Bills M.D. On: 07/19/2019 17:30    Impression: Pleasant 59 year old male with a history of UC diagnosed as inpatient in 2019 with weakness, weight loss, bloody diarrhea; managed on Lialda 4.8g daily that was decreased previously to 2.4g daily. He was in the office a couple days ago with complaints of brief (1 day) low grade fever, N/V which began after eating a "greasy" cheeseburger from the [facility]. Denies abdominal pain, diarrhea, hematochezia. Felt likely food poisoning vs. Gastroenteritis and treated symptomatically with ER precautions given.  Yesterday he was feeling worse, did see small amount of blood in his stool so he came to the ED. One episode of "diarrhea" after oral contrast, none since. CT findings above suggestive of acute on chronic colitis with IMV occlusion and septic thrombophlebitis. He did have a low grade temp in the ER and leukopenia.  Colitis- Initially queried possible infectious etiology given reports of diarrhea but after discussing with the patient this was limited to post-oral contrast (which has happened previously). He was started on steroids IV last night and is feeling significantly improved today. Likely a UC flare. No inflammatory markers noted. Will probably need increased dose of Lialda at discharge. Consider trial of topical therapy for sigmoid colitis.  Nausea/Vomiting- initially felt to be foodborne illness versus gastroenteritis. However, this may be a component of this patients typical flare symptoms. Significantly improved this morning with IV steroids and antiemetic (has only requested one dose last night, none this morning). Continued supportive care  Plan: 1. Continued IV Solumedrol for now 2. CRP 3. Stool studies for any further diarrhea 4. Continue  antiemetic 5. Supportive measures   Thank you for allowing [place]  to participate in the care of [place], DNP, AGNP-C Adult & Gerontological Nurse Practitioner [facility]   ADDENDUM: {ersonally reviewed images with Radiologist Dr. Thornton Papas. Apparent chronic colon ulceration into the wall (noted on CT 3 years ago) that now has contrast due to the contrast for CT. Local inflammatory changes noted sigmoid colon without significant rectal involvement. Cannot rule out diverticulitis, but less likely.. IMV occlusion (thrombosis vs DVT technicality). No contrast or air outside of the colon so does not appear to be perforation. With colon wall thickening, cannot rule out neoplasm and would likely benefit for repeat colonoscopy.   LOS: 1 day     07/20/2019, 9:12 AM

## 2019-07-20 NOTE — Plan of Care (Signed)

## 2019-07-20 NOTE — Progress Notes (Signed)
Nsg Discharge Note  Admit Date:  07/19/2019 Discharge date: 07/20/2019   Arty Baumgartner Pavich to be D/C'd Home per MD order.  AVS completed.  Copy for chart, and copy for patient signed, and dated. Removed IV-clean, dry, intact. Reviewed d/c paperwork with patient and answered all questions. Wheeled stable patient and belongings to ED entrance where his car was parked. Patient/caregiver able to verbalize understanding.  Discharge Medication: Allergies as of 07/20/2019   No Known Allergies     Medication List    STOP taking these medications   aspirin 325 MG tablet     TAKE these medications   acetaminophen 325 MG tablet Commonly known as: TYLENOL Take 325 mg by mouth every 6 (six) hours as needed for fever.   ferrous sulfate 325 (65 FE) MG tablet Commonly known as: FeroSul Take 1 tablet (325 mg total) by mouth daily with breakfast. What changed:   medication strength  See the new instructions.   mesalamine 1.2 g EC tablet Commonly known as: LIALDA Take 4 tablets (4.8 g total) by mouth daily with breakfast. What changed: how much to take   omeprazole 20 MG capsule Commonly known as: PRILOSEC Take 20 mg by mouth daily.   ondansetron 4 MG disintegrating tablet Commonly known as: Zofran ODT Take 1 tablet (4 mg total) by mouth every 6 (six) hours as needed for nausea or vomiting.   predniSONE 10 MG tablet Commonly known as: DELTASONE Take 4 PO QAM x7days, 3 PO QAM x7days, 2 PO QAM x7days, 1 po QAM x 7 Start taking on: Jul 21, 2019   VITAMIN C PO Take by mouth. Drops daily       Discharge Assessment: Vitals:   07/20/19 1200 07/20/19 1610  BP: 104/70 110/73  Pulse: 81 83  Resp: 20 19  Temp: 98.4 F (36.9 C) 98.5 F (36.9 C)  SpO2: 99% 98%   Skin clean, dry and intact without evidence of skin break down, no evidence of skin tears noted. IV catheter discontinued intact. Site without signs and symptoms of complications - no redness or edema noted at insertion site,  patient denies c/o pain - only slight tenderness at site.  Dressing with slight pressure applied.  D/c Instructions-Education: Discharge instructions given to patient/family with verbalized understanding. D/c education completed with patient/family including follow up instructions, medication list, d/c activities limitations if indicated, with other d/c instructions as indicated by MD - patient able to verbalize understanding, all questions fully answered. Patient instructed to return to ED, call 911, or call MD for any changes in condition.  Patient escorted via [place], and D/C home via private auto.  Santa Lighter, RN 07/20/2019 7:42 PM

## 2019-07-20 NOTE — Discharge Instructions (Signed)
IMPORTANT INFORMATION: PAY CLOSE ATTENTION   PHYSICIAN DISCHARGE INSTRUCTIONS  Follow with Primary care provider  Patient, No Pcp Per  and other consultants as instructed by your Hospitalist Physician  SEEK MEDICAL CARE OR RETURN TO EMERGENCY ROOM IF SYMPTOMS COME BACK, WORSEN OR NEW PROBLEM DEVELOPS   Please note: You were cared for by a hospitalist during your hospital stay. Every effort will be made to forward records to your primary care provider.  You can request that your primary care provider send for your hospital records if they have not received them.  Once you are discharged, your primary care physician will handle any further medical issues. Please note that NO REFILLS for any discharge medications will be authorized once you are discharged, as it is imperative that you return to your primary care physician (or establish a relationship with a primary care physician if you do not have one) for your post hospital discharge needs so that they can reassess your need for medications and monitor your lab values.  Please get a complete blood count and chemistry panel checked by your Primary MD at your next visit, and again as instructed by your Primary MD.  Get Medicines reviewed and adjusted: Please take all your medications with you for your next visit with your Primary MD  Laboratory/radiological data: Please request your Primary MD to go over all hospital tests and procedure/radiological results at the follow up, please ask your primary care provider to get all Hospital records sent to his/her office.  In some cases, they will be blood work, cultures and biopsy results pending at the time of your discharge. Please request that your primary care provider follow up on these results.  If you are diabetic, please bring your blood sugar readings with you to your follow up appointment with primary care.    Please call and make your follow up appointments as soon as possible.    Also Note  the following: If you experience worsening of your admission symptoms, develop shortness of breath, life threatening emergency, suicidal or homicidal thoughts you must seek medical attention immediately by calling 911 or calling your MD immediately  if symptoms less severe.  You must read complete instructions/literature along with all the possible adverse reactions/side effects for all the Medicines you take and that have been prescribed to you. Take any new Medicines after you have completely understood and accpet all the possible adverse reactions/side effects.   Do not drive when taking Pain medications or sleeping medications (Benzodiazepines)  Do not take more than prescribed Pain, Sleep and Anxiety Medications. It is not advisable to combine anxiety,sleep and pain medications without talking with your primary care practitioner  Special Instructions: If you have smoked or chewed Tobacco  in the last 2 yrs please stop smoking, stop any regular Alcohol  and or any Recreational drug use.  Wear Seat belts while driving.  Do not drive if taking any narcotic, mind altering or controlled substances or recreational drugs or alcohol.        

## 2019-07-21 LAB — GASTROINTESTINAL PANEL BY PCR, STOOL (REPLACES STOOL CULTURE)

## 2019-07-30 ENCOUNTER — Telehealth: Payer: Self-pay | Admitting: *Deleted

## 2019-07-30 NOTE — Telephone Encounter (Signed)
Pt consented to a virtual visit. 

## 2019-07-30 NOTE — Telephone Encounter (Signed)
.  Arty Baumgartner Upshaw, you are scheduled for a virtual visit with your provider today.  Just as we do with appointments in the office, we must obtain your consent to participate.  Your consent will be active for this visit and any virtual visit you may have with one of our providers in the next 365 days.  If you have a MyChart account, I can also send a copy of this consent to you electronically.  All virtual visits are billed to your insurance company just like a traditional visit in the office.  As this is a virtual visit, video technology does not allow for your provider to perform a traditional examination.  This may limit your provider's ability to fully assess your condition.  If your provider identifies any concerns that need to be evaluated in person or the need to arrange testing such as labs, EKG, etc, we will make arrangements to do so.  Although advances in technology are sophisticated, we cannot ensure that it will always work on either your end or our end.  If the connection with a video visit is poor, we may have to switch to a telephone visit.  With either a video or telephone visit, we are not always able to ensure that we have a secure connection.   I need to obtain your verbal consent now.   Are you willing to proceed with your visit today?

## 2019-08-01 ENCOUNTER — Telehealth (INDEPENDENT_AMBULATORY_CARE_PROVIDER_SITE_OTHER): Payer: 59 | Admitting: Nurse Practitioner

## 2019-08-01 ENCOUNTER — Encounter: Payer: Self-pay | Admitting: Nurse Practitioner

## 2019-08-01 ENCOUNTER — Other Ambulatory Visit: Payer: Self-pay

## 2019-08-01 DIAGNOSIS — K51919 Ulcerative colitis, unspecified with unspecified complications: Secondary | ICD-10-CM

## 2019-08-01 DIAGNOSIS — R112 Nausea with vomiting, unspecified: Secondary | ICD-10-CM

## 2019-08-01 NOTE — Patient Instructions (Signed)
Your health issues we discussed today were:   Ulcerative colitis: 1. I am glad you are doing better 2. You can take your prednisone until it is 3. Continue taking Lialda 4.8 g daily (4 pills daily) call us if you have any worsening or severe symptoms 4. There is not much in the way of significant dietary recommendations for ulcerative colitis that is well managed.  I was not able to find any specific patient handouts on our research database.  What I did found states: "For most patients with IBD in remission, general dietary advice includes consuming a diet comprised of carbohydrates, fats, and protein, while limiting processed foods and artificial sweeteners." 5. However, I did find some information on WebMD that I have printed for you. 6. Call us if you have any worsening symptoms or if you feel you are having another flare  Overall I recommend:  1. Return for follow-up in 3 months for a follow-up visit and labs 2. Call us if you have any questions or current medications   At [facility] we value your feedback. You may receive a survey about your visit today. Please share your experience as we strive to create trusting relationships with our patients to provide genuine, compassionate, quality care.  We appreciate your understanding and patience as we review any laboratory studies, imaging, and other diagnostic tests that are ordered as we care for you. Our office policy is 5 business days for review of these results, and any emergent or urgent results are addressed in a timely manner for your best interest. If you do not hear from our office in 1 week, please contact us.   We also encourage the use of MyChart, which contains your medical information for your review as well. If you are not enrolled in this feature, an access code is on this after visit summary for your convenience. Thank you for allowing [place] to be involved in your care.  It was great to see you today!  I hope you  have a great Summer!!

## 2019-08-01 NOTE — Assessment & Plan Note (Signed)
Nausea and vomiting, as per HPI, initially felt to be viral gastroenteritis versus food poisoning.  Ended up being a flare of ulcerative colitis which she was admitted and started on steroids.  Further treatment of UC as per above.  His symptoms have resolved at this time.  Continue current medications and follow-up in 3 months for a follow-up visit and labs.

## 2019-08-01 NOTE — Assessment & Plan Note (Signed)
Recent admission for a flare of ulcerative colitis with unusual symptoms including nausea and not initially having diarrhea or he did eventually have diarrhea in the hospital, but never had rectal CRP was quite elevated.  He was started on IV steroids which was converted over to a prolonged prednisone taper.  He remains on prednisone and will be finished within the next couple weeks.  We also recommended increasing his Lialda to 4.8 g daily and continue this dose forward.  He is currently following recommendations.  He is asking for any dietary recommendations to help as well.  At this point feeling good overall we will have him follow-up in 3 months for follow-up visit and labs I have asked him to call us if he has any problems including early symptoms of possible flare that we can check with CRP attentionally try to head off before becomes severe.  He verbalized understanding.

## 2019-08-01 NOTE — Progress Notes (Signed)
Referring Provider: No ref. provider found Primary Care Physician:  Patient, No Pcp Per Primary GI:  Dr. Gala Romney (in the absence of Dr. Oneida Alar); pending Dr. Abbey Chatters  NOTE: Service was provided via telemedicine and was requested by the patient due to COVID-19 pandemic.  Patient Location: Home  Provider Location: [place] office  Reason for Phone Visit: Hospital follow-up  Persons present on the phone encounter, with roles: Patient, myself (provider),Mindy Estudillo (updated meds and allergies)  Total time (minutes) spent on medical discussion: 23 minutes  Due to COVID-19, visit was conducted using telephonic method (no video was available).  Visit was requested by patient.  Virtual Visit via Telephone only  I connected with Jaimie Redditt on 08/01/19 at  3:30 PM EDT by telephone and verified that I am speaking with the correct person using two identifiers.   I discussed the limitations, risks, security and privacy concerns of performing an evaluation and management service by telephone and the availability of in person appointments. I also discussed with the patient that there may be a patient responsible charge related to this service. The patient expressed understanding and agreed to proceed.  Chief Complaint  Patient presents with  . Nausea    none and no vomiting, no abd pain, stools are solid    HPI:   Brett Bright is a 60 y.o. male who presents for virtual visit regarding: hospital follow-up.  The patient was last seen in our office 07/17/2019 for nausea and vomiting.  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  Started on Lialda 4.8 g daily subsequently back down to 2.4 g daily.  Labs in September 2020 showed improvement of hemoglobin to 12.3 and  ferritin improved to twenty.  At his visit he had acute onset of significant nausea and vomiting with a mild low-grade fever for 1 day.  Vomiting typically every other hour after eating and drinking, unable to keep down food or fluids in the past 2 to 3 days.  This all started several hours after having a cheeseburger at the [facility] which she noted was greasy and typically does not eat greasy foods.  No recent sick contacts or travel.  No other foods out of the ordinary.  Other overt GI complaints.  Overall felt to be likely self-limiting viral gastroenteritis versus food poisoning.  I sent Zofran dissolvable tabs to the pharmacy for him, clear liquids for 24 hours and then advance to bland go to the emergency room if unable to keep down food or fluids for an additional 24 hours.  Follow-up in 2 to 4 weeks.  The patient was admitted to the hospital from 07/19/2019-07/20/2019.  He presented for persistent nausea and vomiting and unable to keep down food and fluids.  At that time he did admit diarrhea (previously denied to me.)  He denied hematochezia.  After GI work-up overall felt likely ulcerative colitis flare which improved on oral prednisone.  Recommended a prednisone taper, Zofran for nausea, outpatient follow-up.  Recommended outpatient treatment with Lialda 4.8 g daily.  C. difficile and GI pathogen panel resulted negative.  CRP during his admission was elevated at 20.7.  Today he states he's doing ok overall. States he started trning the corner this past Sunday and has since gotten progressively better. Appetite has come back,  strength improved. Denies abdominal pain. No more N/V. Denies diarrhea, hematochezia, melena. Denies fever, chills. Did have some unintentional weight loss but this is improving. Is planning diet changes to include healthier foods, eating healthier overall; eliminate greasy, fatty, high cholesterol foods.  He is still on the Prednisone (currently on 3 a day, starts 2 a  day on Saturday). Taking Lialda 4.8g daily.  Past Medical History:  Diagnosis Date  . Ulcerative colitis with rectal bleeding ([place]) 07/2018    Past Surgical History:  Procedure Laterality Date  . APPENDECTOMY    . BIOPSY  08/16/2018   Procedure: BIOPSY;  Surgeon: Daneil Dolin, MD;  Location: [facility];  Service: Endoscopy;;  . CHOLECYSTECTOMY    . COLONOSCOPY N/A 03/23/2016   Dr. Oneida Alar: Thrombosed external hemorrhoid, internal hemorrhoids, diverticulosis, segmental moderate inflammation sigmoid colon secondary due to colitis with benign biopsies.  Next colonoscopy 5 years due to poor prep right colon.  . COLONOSCOPY N/A 08/16/2018   Dr. Gala Romney: pan-ulcerative colitis, diverticulosis. Bx c/w UC  . ESOPHAGOGASTRODUODENOSCOPY N/A 03/23/2016   Dr. Oneida Alar: Gastritis, no H. pylori, multiple small sessile polyps biopsy consistent with fundic gland, mild duodenitis.  Marland Kitchen ESOPHAGOGASTRODUODENOSCOPY N/A 08/16/2018   Procedure: ESOPHAGOGASTRODUODENOSCOPY (EGD);  Surgeon: Daneil Dolin, MD;  Location: [facility];  Service: Endoscopy;  Laterality: N/A;  . VASECTOMY      Current Outpatient Medications  Medication Sig Dispense Refill  . acetaminophen (TYLENOL) 325 MG tablet Take 325 mg by mouth every 6 (six) hours as needed for fever.    . Ascorbic Acid (VITAMIN C PO) Take by mouth. Drops daily    . ferrous sulfate (FEROSUL) 325 (65 FE) MG tablet Take 1 tablet (325 mg total) by mouth daily with breakfast. 60 tablet 3  . mesalamine (LIALDA) 1.2 g EC tablet Take 4 tablets (4.8 g total) by mouth daily with breakfast. 120 tablet 11  . omeprazole (PRILOSEC) 20 MG capsule Take 20 mg by mouth daily.    . predniSONE (DELTASONE) 10 MG tablet Take 4 PO QAM x7days, 3 PO QAM x7days, 2 PO QAM x7days, 1 po QAM x 7 70 tablet 0   No current facility-administered medications for this visit.    Allergies as of 08/01/2019  . (No Known Allergies)    Family History  Problem Relation Age of Onset  . Stroke  Mother   . Cancer Maternal Grandfather        Unknown type of cancer  . Heart attack Paternal Grandmother   . Cancer Maternal Aunt        Possibly lung cancer  . Cancer Paternal Uncle        Unknown type of cancer  . Cancer Paternal Uncle        Unknown type of cancer  . Colon cancer Neg Hx     Social History   Socioeconomic History  . Marital status: Married    Spouse name: Not on file  . Number of children: 3  . Years of education: Not on file  . Highest education level: Not on file  Occupational History  . Occupation: Primary school teacher    Comment: At [employer]   Tobacco Use  . Smoking status: Never Smoker  . Smokeless tobacco: Never Used  Substance and Sexual Activity  . Alcohol use: No  . Drug use: No  . Sexual activity: Not on file  Other Topics Concern  . 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   He is married and has 3 children, who are ages 36, 47, and 35 as of 2017.   The 41 and 32 year old children are still living at home, but the 58 yo is married and lives elsewhere.   The patient lives in [place], [place].      His primary care is [facility] in [place], [place].   Social Determinants of Health   Financial Resource Strain:   . Difficulty of Paying Living Expenses:   Food Insecurity:   . Worried About Charity fundraiser in the Last Year:   . Arboriculturist in the Last Year:   Transportation Needs:   . Film/video editor (Medical):   Marland Kitchen Lack of Transportation (Non-Medical):   Physical Activity:   . Days of Exercise per Week:   . Minutes of Exercise per Session:   Stress:   . Feeling of Stress :   Social Connections:   . Frequency of Communication with Friends and Family:   . Frequency of Social Gatherings with Friends and Family:   . Attends Religious Services:   . Active Member of Clubs or Organizations:   . Attends Archivist Meetings:   Marland Kitchen Marital  Status:     Review of Systems: Review of Systems  Constitutional: Negative for chills, fever, malaise/fatigue and weight loss.  HENT: Negative for congestion and sore throat.   Respiratory: Negative for cough and shortness of breath.   Cardiovascular: Negative for chest pain and palpitations.  Gastrointestinal: Negative for abdominal pain, blood in stool, diarrhea, melena, nausea and vomiting.  Musculoskeletal: Negative for joint pain and myalgias.  Skin: Negative for rash.  Neurological: Negative for dizziness and weakness.  Endo/Heme/Allergies: Does not bruise/bleed easily.  Psychiatric/Behavioral: Negative for depression. The patient is not nervous/anxious.   All other systems reviewed and are negative.   Physical Exam: Note: limited exam due to virtual visit There were no vitals taken for this visit. Physical Exam Nursing note reviewed.  Constitutional:      General: He is not in acute distress.    Comments: No acute distress noted on telephone  HENT:     Nose: No congestion.     Comments: No obvious congestion noted on telephone Pulmonary:     Effort: No respiratory distress.     Comments: Conversational with no obvious distress heard on telephone Neurological:     General: No focal deficit present.     Mental Status: He is alert and oriented to person, place, and time. Mental status is at baseline.  Psychiatric:        Mood and Affect: Mood normal.        Behavior: Behavior normal.        Thought Content: Thought content normal.

## 2019-08-02 ENCOUNTER — Encounter: Payer: Self-pay | Admitting: Nurse Practitioner

## 2019-08-02 NOTE — Progress Notes (Signed)
No pcp per patient 

## 2019-08-09 ENCOUNTER — Telehealth: Payer: Self-pay

## 2019-08-09 NOTE — Telephone Encounter (Signed)
VM received today 08/09/19 @ 11:07AM. I returned pts call. Pt is having Nausea x 5 days. Pt states the nausea comes and goes. Pt didn't have nausea at all last week. Nausea started on Sunday after eating breakfast and has been staying for the most part. Pt hasn't taken any meds to help with nausea. Pt has a Zofran rx from another doctor but hasn't taken it due to side affects. Pt is taking Prednisone and has one more week left. Pt feels it may be the prednisone causing the nausea but states the nausea is making him feel bad. Pt is having a BM daily and its more loose stools.

## 2019-08-10 NOTE — Telephone Encounter (Signed)
Pt says that he is worried about taking it because of his heart.  He has some Zofran.  I discussed the side effects of Phenergan and he is not interested in that one. Pt wants to know if you think it is safe to take Zofran?

## 2019-08-10 NOTE — Telephone Encounter (Signed)
Called pt and informed him of EG's recommendations.  Pt was informed that EG thinks Zofran will be ok to try but do not take any more than twice a day. Call if any concerns or side effects.  Pt voiced understanding to this information given.

## 2019-08-10 NOTE — Telephone Encounter (Signed)
Pt called back in today to fu on note.  Wants to know if we can respond before we close today.

## 2019-08-10 NOTE — Telephone Encounter (Signed)
His last EKG doesn't show a particularly prolonged OTc interval. QTc prolongation is a pretty rare thing and he's not on any other medications that can contribute to that. I think Zofran will be ok to try, do not take any more than twice a day. Call if any concerns or side effects

## 2019-08-10 NOTE — Telephone Encounter (Signed)
What side effects does Zofran cause?  Only other option would be Phenergan which can cause sleepiness/drowsiness and would be advised to not drive while taking it.

## 2019-08-13 ENCOUNTER — Inpatient Hospital Stay (HOSPITAL_COMMUNITY)
Admission: EM | Admit: 2019-08-13 | Discharge: 2019-08-24 | DRG: 391 | Disposition: A | Discharge status: Home Health | Payer: 59 | Attending: Internal Medicine | Admitting: Internal Medicine

## 2019-08-13 ENCOUNTER — Emergency Department (HOSPITAL_COMMUNITY): Payer: 59

## 2019-08-13 ENCOUNTER — Other Ambulatory Visit: Payer: Self-pay

## 2019-08-13 ENCOUNTER — Encounter (HOSPITAL_COMMUNITY): Payer: Self-pay | Admitting: Emergency Medicine

## 2019-08-13 DIAGNOSIS — Z1629 Resistance to other single specified antibiotic: Secondary | ICD-10-CM | POA: Diagnosis present

## 2019-08-13 DIAGNOSIS — Z9049 Acquired absence of other specified parts of digestive tract: Secondary | ICD-10-CM

## 2019-08-13 DIAGNOSIS — R198 Other specified symptoms and signs involving the digestive system and abdomen: Secondary | ICD-10-CM

## 2019-08-13 DIAGNOSIS — N289 Disorder of kidney and ureter, unspecified: Secondary | ICD-10-CM | POA: Diagnosis present

## 2019-08-13 DIAGNOSIS — K631 Perforation of intestine (nontraumatic): Secondary | ICD-10-CM | POA: Diagnosis not present

## 2019-08-13 DIAGNOSIS — R319 Hematuria, unspecified: Secondary | ICD-10-CM | POA: Diagnosis present

## 2019-08-13 DIAGNOSIS — E871 Hypo-osmolality and hyponatremia: Secondary | ICD-10-CM | POA: Diagnosis present

## 2019-08-13 DIAGNOSIS — K651 Peritoneal abscess: Secondary | ICD-10-CM

## 2019-08-13 DIAGNOSIS — D500 Iron deficiency anemia secondary to blood loss (chronic): Secondary | ICD-10-CM | POA: Diagnosis present

## 2019-08-13 DIAGNOSIS — E878 Other disorders of electrolyte and fluid balance, not elsewhere classified: Secondary | ICD-10-CM | POA: Diagnosis present

## 2019-08-13 DIAGNOSIS — R7401 Elevation of levels of liver transaminase levels: Secondary | ICD-10-CM | POA: Diagnosis present

## 2019-08-13 DIAGNOSIS — D638 Anemia in other chronic diseases classified elsewhere: Secondary | ICD-10-CM | POA: Diagnosis present

## 2019-08-13 DIAGNOSIS — K219 Gastro-esophageal reflux disease without esophagitis: Secondary | ICD-10-CM | POA: Diagnosis present

## 2019-08-13 DIAGNOSIS — B9620 Unspecified Escherichia coli [E. coli] as the cause of diseases classified elsewhere: Secondary | ICD-10-CM | POA: Diagnosis present

## 2019-08-13 DIAGNOSIS — Z823 Family history of stroke: Secondary | ICD-10-CM

## 2019-08-13 DIAGNOSIS — Z20822 Contact with and (suspected) exposure to covid-19: Secondary | ICD-10-CM | POA: Diagnosis present

## 2019-08-13 DIAGNOSIS — Z6830 Body mass index (BMI) 30.0-30.9, adult: Secondary | ICD-10-CM

## 2019-08-13 DIAGNOSIS — K5190 Ulcerative colitis, unspecified, without complications: Secondary | ICD-10-CM

## 2019-08-13 DIAGNOSIS — K604 Rectal fistula: Secondary | ICD-10-CM | POA: Diagnosis present

## 2019-08-13 DIAGNOSIS — I809 Phlebitis and thrombophlebitis of unspecified site: Secondary | ICD-10-CM | POA: Diagnosis present

## 2019-08-13 DIAGNOSIS — E860 Dehydration: Secondary | ICD-10-CM | POA: Diagnosis present

## 2019-08-13 DIAGNOSIS — R197 Diarrhea, unspecified: Secondary | ICD-10-CM

## 2019-08-13 DIAGNOSIS — K5720 Diverticulitis of large intestine with perforation and abscess without bleeding: Principal | ICD-10-CM | POA: Diagnosis present

## 2019-08-13 DIAGNOSIS — Z8249 Family history of ischemic heart disease and other diseases of the circulatory system: Secondary | ICD-10-CM

## 2019-08-13 DIAGNOSIS — E876 Hypokalemia: Secondary | ICD-10-CM | POA: Diagnosis present

## 2019-08-13 DIAGNOSIS — R112 Nausea with vomiting, unspecified: Secondary | ICD-10-CM | POA: Diagnosis present

## 2019-08-13 DIAGNOSIS — Z79899 Other long term (current) drug therapy: Secondary | ICD-10-CM

## 2019-08-13 DIAGNOSIS — K51913 Ulcerative colitis, unspecified with fistula: Secondary | ICD-10-CM | POA: Diagnosis present

## 2019-08-13 DIAGNOSIS — E538 Deficiency of other specified B group vitamins: Secondary | ICD-10-CM | POA: Diagnosis present

## 2019-08-13 DIAGNOSIS — I81 Portal vein thrombosis: Secondary | ICD-10-CM | POA: Diagnosis present

## 2019-08-13 DIAGNOSIS — E43 Unspecified severe protein-calorie malnutrition: Secondary | ICD-10-CM | POA: Diagnosis present

## 2019-08-13 DIAGNOSIS — K6819 Other retroperitoneal abscess: Secondary | ICD-10-CM | POA: Diagnosis present

## 2019-08-13 DIAGNOSIS — E861 Hypovolemia: Secondary | ICD-10-CM | POA: Diagnosis present

## 2019-08-13 LAB — COMPREHENSIVE METABOLIC PANEL
ALT: 143 U/L — ABNORMAL HIGH (ref 0–44)
AST: 107 U/L — ABNORMAL HIGH (ref 15–41)
Albumin: 2.3 g/dL — ABNORMAL LOW (ref 3.5–5.0)
Alkaline Phosphatase: 77 U/L (ref 38–126)
Anion gap: 11 (ref 5–15)
BUN: 13 mg/dL (ref 6–20)
CO2: 23 mmol/L (ref 22–32)
Calcium: 8.1 mg/dL — ABNORMAL LOW (ref 8.9–10.3)
Chloride: 93 mmol/L — ABNORMAL LOW (ref 98–111)
Creatinine, Ser: 0.78 mg/dL (ref 0.61–1.24)
GFR calc Af Amer: >60 mL/min (ref >60)
GFR calc non Af Amer: >60 mL/min (ref >60)
Glucose, Bld: 121 mg/dL — ABNORMAL HIGH (ref 70–99)
Potassium: 3.2 mmol/L — ABNORMAL LOW (ref 3.5–5.1)
Sodium: 127 mmol/L — ABNORMAL LOW (ref 135–145)
Total Bilirubin: 0.6 mg/dL (ref 0.3–1.2)
Total Protein: 7.2 g/dL (ref 6.5–8.1)

## 2019-08-13 LAB — CBC WITH DIFFERENTIAL/PLATELET
Abs Immature Granulocytes: 0.07 10*3/uL (ref 0.00–0.07)
Basophils Absolute: 0.1 10*3/uL (ref 0.0–0.1)
Basophils Relative: 1 %
Eosinophils Absolute: 0 10*3/uL (ref 0.0–0.5)
Eosinophils Relative: 0 %
HCT: 35.5 % — ABNORMAL LOW (ref 39.0–52.0)
Hemoglobin: 11.4 g/dL — ABNORMAL LOW (ref 13.0–17.0)
Immature Granulocytes: 1 %
Lymphocytes Relative: 4 %
Lymphs Abs: 0.6 10*3/uL — ABNORMAL LOW (ref 0.7–4.0)
MCH: 27 pg (ref 26.0–34.0)
MCHC: 32.1 g/dL (ref 30.0–36.0)
MCV: 83.9 fL (ref 80.0–100.0)
Monocytes Absolute: 1 10*3/uL (ref 0.1–1.0)
Monocytes Relative: 7 %
Neutro Abs: 13 10*3/uL — ABNORMAL HIGH (ref 1.7–7.7)
Neutrophils Relative %: 87 %
Platelets: 312 10*3/uL (ref 150–400)
RBC: 4.23 MIL/uL (ref 4.22–5.81)
RDW: 13.1 % (ref 11.5–15.5)
WBC: 14.9 10*3/uL — ABNORMAL HIGH (ref 4.0–10.5)
nRBC: 0 % (ref 0.0–0.2)

## 2019-08-13 LAB — URINALYSIS, ROUTINE W REFLEX MICROSCOPIC
Bilirubin Urine: NEGATIVE
Glucose, UA: NEGATIVE mg/dL
Hgb urine dipstick: NEGATIVE
Ketones, ur: NEGATIVE mg/dL
Leukocytes,Ua: NEGATIVE
Nitrite: NEGATIVE
Protein, ur: NEGATIVE mg/dL
Specific Gravity, Urine: 1.008 (ref 1.005–1.030)
pH: 6 (ref 5.0–8.0)

## 2019-08-13 LAB — LACTIC ACID, PLASMA
Lactic Acid, Venous: 0.9 mmol/L (ref 0.5–1.9)
Lactic Acid, Venous: 1 mmol/L (ref 0.5–1.9)

## 2019-08-13 LAB — SARS CORONAVIRUS 2 BY RT PCR (HOSPITAL ORDER, PERFORMED IN ~~LOC~~ HOSPITAL LAB): SARS Coronavirus 2: NEGATIVE

## 2019-08-13 LAB — MAGNESIUM: Magnesium: 2.1 mg/dL (ref 1.7–2.4)

## 2019-08-13 LAB — LIPASE, BLOOD: Lipase: 51 U/L (ref 11–51)

## 2019-08-13 MED ORDER — ONDANSETRON HCL 4 MG/2ML IJ SOLN
4.0000 mg | Freq: Once | INTRAMUSCULAR | Status: AC
  Administered 2019-08-13: 4 mg via INTRAVENOUS
  Filled 2019-08-13: qty 2

## 2019-08-13 MED ORDER — MORPHINE SULFATE (PF) 4 MG/ML IV SOLN
4.0000 mg | INTRAVENOUS | Status: DC | PRN
  Administered 2019-08-16 – 2019-08-17 (×2): 4 mg via INTRAVENOUS
  Filled 2019-08-13 (×2): qty 1

## 2019-08-13 MED ORDER — PIPERACILLIN-TAZOBACTAM 3.375 G IVPB 30 MIN
3.3750 g | Freq: Three times a day (TID) | INTRAVENOUS | Status: DC
  Administered 2019-08-13 – 2019-08-19 (×18): 3.375 g via INTRAVENOUS
  Filled 2019-08-13 (×31): qty 50

## 2019-08-13 MED ORDER — IOHEXOL 300 MG/ML  SOLN
100.0000 mL | Freq: Once | INTRAMUSCULAR | Status: AC | PRN
  Administered 2019-08-13: 100 mL via INTRAVENOUS

## 2019-08-13 MED ORDER — ONDANSETRON HCL 4 MG/2ML IJ SOLN: 4.0000 mg | Freq: Once | INTRAMUSCULAR | Status: AC

## 2019-08-13 MED ORDER — ONDANSETRON HCL 4 MG/2ML IJ SOLN
INTRAMUSCULAR | Status: AC
  Administered 2019-08-13: 4 mg via INTRAVENOUS
  Filled 2019-08-13: qty 2

## 2019-08-13 MED ORDER — PIPERACILLIN-TAZOBACTAM 3.375 G IVPB 30 MIN
3.3750 g | Freq: Once | INTRAVENOUS | Status: AC
  Administered 2019-08-13: 3.375 g via INTRAVENOUS
  Filled 2019-08-13: qty 50

## 2019-08-13 MED ORDER — POTASSIUM CHLORIDE 10 MEQ/100ML IV SOLN: 10.0000 meq | INTRAVENOUS | Status: DC

## 2019-08-13 MED ORDER — SODIUM CHLORIDE 0.9 % IV BOLUS
1000.0000 mL | Freq: Once | INTRAVENOUS | Status: AC
  Administered 2019-08-13: 1000 mL via INTRAVENOUS

## 2019-08-13 MED ORDER — IOHEXOL 9 MG/ML PO SOLN
ORAL | Status: AC
  Filled 2019-08-13: qty 1000

## 2019-08-13 MED ORDER — SODIUM CHLORIDE 0.9 % IV SOLN: Freq: Once | INTRAVENOUS | Status: AC

## 2019-08-13 MED ORDER — POTASSIUM CHLORIDE 10 MEQ/100ML IV SOLN
10.0000 meq | INTRAVENOUS | Status: AC
  Administered 2019-08-13 (×4): 10 meq via INTRAVENOUS
  Filled 2019-08-13 (×4): qty 100

## 2019-08-13 NOTE — Consult Note (Signed)
Patient seen, chart reviewed.  Patient has recently diagnosed history of ulcerative colitis but has multiple medical problems including portal venous thrombosis.  He currently is not having significant abdominal pain.  His abdomen is soft without peritoneal signs.  I did review his labs and CT scan findings.  Given these findings, patient needs a higher level of care than can be offered here at [facility]  [facility] has agreed to taken the patient on transfer, but we are waiting for bed.  He is stable at this point and does not need acute surgical intervention.  He will be transferred from the ER once a bed is available.  His care has been discussed with Dr. Eulis Foster.

## 2019-08-13 NOTE — ED Provider Notes (Signed)
[facility] EMERGENCY DEPARTMENT Provider Note   CSN: [id number] Arrival date & time: 08/13/19  2820   Time seen 5:55 AM  History Chief Complaint  Patient presents with  . Emesis    Brett Bright is a 59 y.o. male.  HPI   Patient states he was diagnosed with ulcerative colitis about a year ago.  He states he is never had abdominal pain with it.  He states he was admitted to the hospital from 4/29 through 4/30 for ulcerative colitis flare.  He was treated with oral prednisone, he states he started the last week of 10 mg tablets on the 21st.  He states he was doing better.  He states on April 15 however he started having nausea, vomiting, and diarrhea.  He states he vomited about 5 times yesterday without blood.  He states he is having 4-5 episodes of loose diarrhea a day without blood.  He denies abdominal pain.  He states he did notice some blood in his urine yesterday but he denies flank pain.  Patient states from the vomiting and diarrhea he just does not feel good.  PCP Patient, No Pcp Per GI Dr Gala Romney  Past Medical History:  Diagnosis Date  . Ulcerative colitis with rectal bleeding ([place]) 07/2018    Patient Active Problem List   Diagnosis Date Noted  . Nausea with vomiting 07/17/2019  . Ulcerative pancolitis with rectal bleeding ([place])   . Iron deficiency anemia due to chronic blood loss   . Acute GI bleeding 08/15/2018  . Symptomatic anemia 08/15/2018  . AKI (acute kidney injury) ([place]) 08/15/2018  . Thrombocytosis ([place]) 08/15/2018  . Epigastric pain   . Ulcerative colitis ([place])   . Nausea vomiting and diarrhea   . Sepsis due to undetermined organism ([place]) 03/22/2016  . Acute calculous cholecystitis 03/22/2016  . 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 Elevated LFTs 03/22/2016  . Hyponatremia 03/22/2016  . Hypokalemia 03/22/2016  . Acute blood loss anemia 03/22/2016  . Abnormal  serum level of lipase 03/22/2016  . 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 APPENDECTOMY    . BIOPSY  08/16/2018   Procedure: BIOPSY;  Surgeon: Daneil Dolin, MD;  Location: [facility];  Service: Endoscopy;;  . CHOLECYSTECTOMY    . COLONOSCOPY N/A 03/23/2016   Dr. Oneida Alar: Thrombosed external hemorrhoid, internal hemorrhoids, diverticulosis, segmental moderate inflammation sigmoid colon secondary due to colitis with benign biopsies.  Next colonoscopy 5 years due to poor prep right colon.  . COLONOSCOPY N/A 08/16/2018   Dr. Gala Romney: pan-ulcerative colitis, diverticulosis. Bx c/w UC  . ESOPHAGOGASTRODUODENOSCOPY N/A 03/23/2016   Dr. Oneida Alar: Gastritis, no H. pylori, multiple small sessile polyps biopsy consistent with fundic gland, mild duodenitis.  Marland Kitchen ESOPHAGOGASTRODUODENOSCOPY N/A 08/16/2018   Procedure: ESOPHAGOGASTRODUODENOSCOPY (EGD);  Surgeon: Daneil Dolin, MD;  Location: [facility];  Service: Endoscopy;  Laterality: N/A;  . VASECTOMY         Family History  Problem Relation Age of Onset  . Stroke Mother   . Cancer Maternal Grandfather        Unknown type of cancer  . Heart attack Paternal Grandmother   . Cancer Maternal Aunt        Possibly lung cancer  . Cancer Paternal Uncle        Unknown type of cancer  . Cancer Paternal Uncle  Unknown type of cancer  . Colon cancer Neg Hx     Social History   Tobacco Use  . Smoking status: Never Smoker  . Smokeless tobacco: Never Used  Substance Use Topics  . Alcohol use: No  . Drug use: No    Home Medications Prior to Admission medications   Medication Sig Start Date End Date Taking? Authorizing Provider  acetaminophen (TYLENOL) 325 MG tablet Take 325 mg by mouth every 6 (six) hours as needed for fever.    [provider]  Ascorbic Acid (VITAMIN C PO) Take by mouth. Drops daily    [provider]  ferrous sulfate (FEROSUL) 325 (65 FE) MG tablet  Take 1 tablet (325 mg total) by mouth daily with breakfast. 07/20/19   Wynetta Emery, Clanford L, MD  mesalamine (LIALDA) 1.2 g EC tablet Take 4 tablets (4.8 g total) by mouth daily with breakfast. 07/20/19   Johnson, Clanford L, MD  omeprazole (PRILOSEC) 20 MG capsule Take 20 mg by mouth daily.    [provider]  predniSONE (DELTASONE) 10 MG tablet Take 4 PO QAM x7days, 3 PO QAM x7days, 2 PO QAM x7days, 1 po QAM x 7 07/21/19   Johnson, Clanford L, MD    Allergies    Patient has no known allergies.  Review of Systems   Review of Systems  All other systems reviewed and are negative.   Physical Exam Updated Vital Signs BP 123/81 (BP Location: Left Arm)   Pulse 99   Temp 98.2 F (36.8 C) (Oral)   Resp 18   Ht 5' 9"  (1.753 m)   Wt 92.5 kg   SpO2 97%   BMI 30.11 kg/m   Physical Exam Vitals and nursing note reviewed.  Constitutional:      Appearance: Normal appearance. He is normal weight.  HENT:     Head: Normocephalic and atraumatic.     Right Ear: External ear normal.     Left Ear: External ear normal.     Nose: Nose normal.     Mouth/Throat:     Mouth: Mucous membranes are dry.  Eyes:     Extraocular Movements: Extraocular movements intact.     Conjunctiva/sclera: Conjunctivae normal.     Pupils: Pupils are equal, round, and reactive to light.  Cardiovascular:     Rate and Rhythm: Normal rate and regular rhythm.     Pulses: Normal pulses.  Pulmonary:     Effort: Pulmonary effort is normal. No respiratory distress.     Breath sounds: Normal breath sounds.  Abdominal:     General: Bowel sounds are decreased.     Palpations: Abdomen is soft.     Tenderness: There is abdominal tenderness. There is no guarding or rebound.       Comments: Patient has mild discomfort in his left lower quadrant to palpation  Musculoskeletal:        General: Normal range of motion.     Cervical back: Normal range of motion.  Skin:    General: Skin is warm and dry.  Neurological:      General: No focal deficit present.     Mental Status: He is alert and oriented to person, place, and time.     Cranial Nerves: No cranial nerve deficit.  Psychiatric:        Mood and Affect: Mood normal.        Behavior: Behavior normal.        Thought Content: Thought content normal.  ED Results / Procedures / Treatments   Labs (all labs ordered are listed, but only abnormal results are displayed) Results for orders placed or performed during the hospital encounter of 08/13/19  Comprehensive metabolic panel  Result Value Ref Range   Sodium 127 (L) 135 - 145 mmol/L   Potassium 3.2 (L) 3.5 - 5.1 mmol/L   Chloride 93 (L) 98 - 111 mmol/L   CO2 23 22 - 32 mmol/L   Glucose, Bld 121 (H) 70 - 99 mg/dL   BUN 13 6 - 20 mg/dL   Creatinine, Ser 0.78 0.61 - 1.24 mg/dL   Calcium 8.1 (L) 8.9 - 10.3 mg/dL   Total Protein 7.2 6.5 - 8.1 g/dL   Albumin 2.3 (L) 3.5 - 5.0 g/dL   AST 107 (H) 15 - 41 U/L   ALT 143 (H) 0 - 44 U/L   Alkaline Phosphatase 77 38 - 126 U/L   Total Bilirubin 0.6 0.3 - 1.2 mg/dL   GFR calc non Af Amer >60 >60 mL/min   GFR calc Af Amer >60 >60 mL/min   Anion gap 11 5 - 15  CBC with Differential  Result Value Ref Range   WBC 14.9 (H) 4.0 - 10.5 K/uL   RBC 4.23 4.22 - 5.81 MIL/uL   Hemoglobin 11.4 (L) 13.0 - 17.0 g/dL   HCT 35.5 (L) 39.0 - 52.0 %   MCV 83.9 80.0 - 100.0 fL   MCH 27.0 26.0 - 34.0 pg   MCHC 32.1 30.0 - 36.0 g/dL   RDW 13.1 11.5 - 15.5 %   Platelets 312 150 - 400 K/uL   nRBC 0.0 0.0 - 0.2 %   Neutrophils Relative % 87 %   Neutro Abs 13.0 (H) 1.7 - 7.7 K/uL   Lymphocytes Relative 4 %   Lymphs Abs 0.6 (L) 0.7 - 4.0 K/uL   Monocytes Relative 7 %   Monocytes Absolute 1.0 0.1 - 1.0 K/uL   Eosinophils Relative 0 %   Eosinophils Absolute 0.0 0.0 - 0.5 K/uL   Basophils Relative 1 %   Basophils Absolute 0.1 0.0 - 0.1 K/uL   Immature Granulocytes 1 %   Abs Immature Granulocytes 0.07 0.00 - 0.07 K/uL  Lipase, blood  Result Value Ref Range   Lipase 51  11 - 51 U/L    Laboratory interpretation all normal except hyponatremia, hypokalemia, low chloride, all consistent with dehydration, minor elevation of LFTs, leukocytosis, mild anemia    EKG None  Radiology No results found.   CT ABDOMEN PELVIS W CONTRAST  Addendum Date: 07/19/2019   ADDENDUM REPORT: 07/19/2019 17:37 ADDENDUM: Critical Value/emergent results were called by telephone at the time of interpretation on 07/19/2019 at 5:37 pm to provider Kem Parkinson, who verbally acknowledged these results. Electronically Signed   By: Zetta Bills M.D.   On: 07/19/2019 17:37   Result Date: 07/19/2019 CLINICAL DATA:  Abdominal abscess, infection suspected in the setting of ulcerative colitis. IMPRESSION: 1. Signs of worsening acute on chronic inflammation, sinus tract formation in the wall of the colon and increased pericolonic stranding in this patient with ulcerative colitis. 2. 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 Inflammation within the colon likely leading to septic thrombophlebitis. 3. Given the marked colonic thickening the possibility of underlying neoplasm remains a consideration and is seen in the setting of increasing size of retroperitoneal lymph nodes. Follow-up colonoscopy is suggested. 4. Lobular hepatic contours raise the question of liver disease/fibrosis or cirrhosis. Laboratory and clinical correlation may  be helpful. Scarring in the liver from prior RIGHT portal thrombosis. 5. Multiple RIGHT renal lesions of variable density most reflecting hemorrhagic cysts. Some new however compared to exam dating back to 2014. Other small lesions higher density are indeterminate and potentially enhancing based on this evaluation. Consider follow-up renal protocol CT or MRI on a nonemergent basis, within 3-6 months. Call is out to the referring provider to further discuss findings in the above case. Electronically Signed: By: Zetta Bills M.D. On: 07/19/2019 17:30    Procedures Procedures (including critical care time)  Medications Ordered in ED Medications  sodium chloride 0.9 % bolus 1,000 mL (1,000 mLs Intravenous New Bag/Given 08/13/19 0618)  sodium chloride 0.9 % bolus 1,000 mL (1,000 mLs Intravenous New Bag/Given 08/13/19 0617)  ondansetron (ZOFRAN) injection 4 mg (4 mg Intravenous Given 08/13/19 0617)  iohexol (OMNIPAQUE) 9 MG/ML oral solution (  Contrast Given 08/13/19 8381)    ED Course  I have reviewed the triage vital signs and the nursing notes.  Pertinent labs & imaging results that were available during my care of the patient were reviewed by me and considered in my medical decision making (see chart for details).    MDM Rules/Calculators/A&P                      Patient appears dehydrated.  He was given IV fluids and IV Zofran for nausea.  He denies having any abdominal pain but only had mild discomfort when I palpated his abdomen.  He therefore was not given pain medication.  We discussed the results of his prior CT scan and he was noted to have hemorrhagic system of his kidneys which probably explains the hematuria.  He states he had been unaware of that.  He also was unaware that his CT suggested he may have cirrhosis.  He was aware of the blood clot in one of the vessels in his abdomen.  We discussed the need to do another CT scan this morning and he is agreeable.  Patient was turned over to Dr. Eulis Foster at change of shift to get the results of his CT scan and determine his disposition.   Final Clinical Impression(s) / ED Diagnoses Final diagnoses:  Ulcerative colitis without complications, unspecified location ([place])  Hypokalemia  Nausea vomiting and diarrhea  Dehydration    Rx / DC Orders  Disposition pending  Rolland Porter, MD, Barbette Or, MD 08/13/19 [phone]

## 2019-08-13 NOTE — ED Notes (Signed)
Pt has received 4 bags of Potassium per order. Epic would not let this nurse scan meds or accept med order. This is causing pt's MAR to look incorrect.

## 2019-08-13 NOTE — ED Triage Notes (Signed)
Pt C/O N/V and diarrhea that started on 08/04/2019. Pt also reports blood in his urine. Pt denies pain.

## 2019-08-13 NOTE — ED Provider Notes (Signed)
7:15 AM- Checkout from Dr. Tomi Bamberger to evaluate CT and consider disposition.  8:52 AM-call received from radiologist regarding CT scan.   IMPRESSION: 1. Gas and fluid in the LEFT retroperitoneum and extending into peritoneal spaces via mesenteric pathways in the LEFT abdomen, associated with interloop fluid and gas as well as loculated collections in the mesorectum. Extensive distribution of this process favors septic thrombophlebitis, likely associated with focal colonic perforation. Process best seen on image 76 of series 2 were gas along the IMV at the site of previous thrombus tracks into the LEFT retroperitoneum, extending superiorly and inferiorly from this epicenter are numerous gas and fluid containing collections. 2. Again with focal colonic thickening in the setting of UC colonic neoplasm is considered, as discussed on the previous exam. 3. Secondary enteritis due to extensive inflammation. Enteritis within the jejunum on today's exam 4. Numerous lymph nodes throughout the retroperitoneum and sigmoid mesentery. Many of these are small and some are abnormal with respect to number. The largest in the LEFT retroperitoneum adjacent to the ureter and gonadal vessels measuring 1.3 cm. 5. Large RIGHT renal cyst and areas of mixed attenuation as described on the recent CT evaluation. Follow-up renal protocol CT when the patient is able is suggested to exclude the possibility of new enhancing lesions, solid renal neoplasm, as discussed on the previous CT evaluation. 6. Mildly heterogeneous appearance of the anterior posterior division superior aspect RIGHT hemi liver likely related to prior portal thrombosis as before.  Critical Value/emergent results were called by telephone at the time of interpretation on 08/13/2019 at 8:58 am to provider Dr. Eulis Foster, Who verbally acknowledged these results.   Electronically Signed   By: Zetta Bills M.D.    Clinical Course as of Aug 13 1339  Mon Aug 13, 2019  0903 Nitrite: NEGATIVE [EW]  0929 Normal  Magnesium [EW]  0929 Normal  Lipase, blood [EW]  0929 Normal except sodium low, potassium low, chloride low, leukocyte, calcium low, albumin low, AST high, ALT high  Comprehensive metabolic panel(!) [EW]  4098 Normal  Urinalysis, Routine w reflex microscopic [EW]  0930 Normal except white count high 14.9, hemoglobin 11.4  CBC with Differential(!) [EW]  1030 I discussed the case with Dr. Vivia Ewing, emergency general surgeon at [facility].  He accepts patient, pending an open bed.  Currently there are no beds available.  Patient is being entered onto a wait list for a bed.  We will be contacted when 1 opens up.   [EW]  1340 Normal  SARS Coronavirus 2 by RT PCR (hospital order, performed in [place] hospital lab) Nasopharyngeal Nasopharyngeal Swab [EW]  1340 Normal  Lactic acid, plasma [EW]    Clinical Course User Index [EW] Daleen Bo, MD     Patient Vitals for the past 24 hrs:  BP Temp Temp src Pulse Resp SpO2 Height Weight  08/13/19 1030 102/67 -- -- 94 -- 97 % -- --  08/13/19 1000 103/65 -- -- 92 -- 93 % -- --  08/13/19 0900 105/66 -- -- 93 -- 94 % -- --  08/13/19 0551 123/81 98.2 F (36.8 C) Oral 99 18 97 % -- --  08/13/19 0549 -- -- -- -- -- -- 5' 9"  (1.753 m) 92.5 kg   9:25 AM-Case discussed with Dr. Aviva Signs, surgeon on call at this facility.  He states that the patient requires hospitalization and operative intervention, but is beyond the scope of possible care at this facility.  He suggests referral  to [facility].  1:41 PM Reevaluation with update and discussion. After initial assessment and treatment, an updated evaluation reveals patient is comfortable and agreeable to treatment and transfer plan. Daleen Bo   .Critical Care Performed by: Daleen Bo, MD Authorized by: Daleen Bo, MD   Critical care provider statement:    Critical care time (minutes):   55   Critical care start time:  08/13/2019 7:18 AM   Critical care end time:  08/13/2019 9:37 AM   Critical care time was exclusive of:  Separately billable procedures and treating other patients   Critical care was necessary to treat or prevent imminent or life-threatening deterioration of the following conditions:  Sepsis   Critical care was time spent personally by me on the following activities:  Blood draw for specimens, development of treatment plan with patient or surrogate, discussions with consultants, evaluation of patient's response to treatment, examination of patient, obtaining history from patient or surrogate, ordering and performing treatments and interventions, ordering and review of laboratory studies, pulse oximetry, re-evaluation of patient's condition, review of old charts and ordering and review of radiographic studies    Medical Decision Making:  This patient is presenting for evaluation of decreased appetite, with vomiting and diarrhea, which does require a range of treatment options, and is a complaint that involves a high risk of morbidity and mortality. The differential diagnoses include ulcerative colitis flare, sepsis, septic thrombophlebitis. I decided to review old records, and in summary patient with progressive symptoms, recently hospitalized about 3 weeks ago for similar problem.  At that time he was treated with prednisone for suspected UC flare..  I did not require additional historical information from anyone.  Clinical Laboratory Tests Ordered, included CBC, Metabolic panel, Urinalysis and Magnesium, lipase. Review indicates metabolic abnormalities including low sodium, low potassium, low chloride, increased glucose, low albumin, mild transaminitis, increased white count, slightly low hemoglobin, normal urine.. Radiologic Tests Ordered, included CT abdomen pelvis.  I independently Visualized: CT images, which show complicated picture with retroperitoneal air, and  fluid, and multiple abscesses consistent with septic thrombophlebitis, as well as local spread of infection.  Critical Interventions-clinical evaluation, laboratory testing, CT imaging, observation and reassessment.  Discussion with surgery to arrange evaluation here at this facility.  After local consultation, contacted [facility], who accepts patient in transfer, when bed is made available.  Patient treated in the ED with IV fluids, antibiotics and close observation.  He remained hemodynamically stable.  After These Interventions, the Patient was reevaluated and was found to require admission for perforated colon, differential diagnosis includes ulcerative colitis, colon cancer, septic thrombophlebitis, kidney cancer.  Patient requires hospitalization for evaluation and possible treatment surgery.  No emergent surgical intervention required after initial consultation with local general surgery service.  CRITICAL CARE-yes Performed by: Daleen Bo  Nursing Notes Reviewed/ Care Coordinated Applicable Imaging Reviewed Interpretation of Laboratory Data incorporated into ED treatment  Plan-ongoing management by ED provider staff, pending transfer to tertiary care facility for evaluation and treatment.     Daleen Bo, MD 08/14/19 1150

## 2019-08-14 DIAGNOSIS — R198 Other specified symptoms and signs involving the digestive system and abdomen: Secondary | ICD-10-CM | POA: Insufficient documentation

## 2019-08-14 DIAGNOSIS — Z20822 Contact with and (suspected) exposure to covid-19: Secondary | ICD-10-CM | POA: Diagnosis present

## 2019-08-14 DIAGNOSIS — D500 Iron deficiency anemia secondary to blood loss (chronic): Secondary | ICD-10-CM | POA: Diagnosis not present

## 2019-08-14 DIAGNOSIS — K51913 Ulcerative colitis, unspecified with fistula: Secondary | ICD-10-CM | POA: Diagnosis present

## 2019-08-14 DIAGNOSIS — E860 Dehydration: Secondary | ICD-10-CM | POA: Diagnosis present

## 2019-08-14 DIAGNOSIS — R112 Nausea with vomiting, unspecified: Secondary | ICD-10-CM | POA: Diagnosis not present

## 2019-08-14 DIAGNOSIS — E861 Hypovolemia: Secondary | ICD-10-CM | POA: Diagnosis present

## 2019-08-14 DIAGNOSIS — K5100 Ulcerative (chronic) pancolitis without complications: Secondary | ICD-10-CM | POA: Diagnosis not present

## 2019-08-14 DIAGNOSIS — E538 Deficiency of other specified B group vitamins: Secondary | ICD-10-CM | POA: Diagnosis present

## 2019-08-14 DIAGNOSIS — Z6830 Body mass index (BMI) 30.0-30.9, adult: Secondary | ICD-10-CM | POA: Diagnosis not present

## 2019-08-14 DIAGNOSIS — K631 Perforation of intestine (nontraumatic): Secondary | ICD-10-CM | POA: Diagnosis present

## 2019-08-14 DIAGNOSIS — E43 Unspecified severe protein-calorie malnutrition: Secondary | ICD-10-CM | POA: Diagnosis present

## 2019-08-14 DIAGNOSIS — K604 Rectal fistula: Secondary | ICD-10-CM | POA: Diagnosis present

## 2019-08-14 DIAGNOSIS — K5720 Diverticulitis of large intestine with perforation and abscess without bleeding: Secondary | ICD-10-CM | POA: Diagnosis present

## 2019-08-14 DIAGNOSIS — Z8249 Family history of ischemic heart disease and other diseases of the circulatory system: Secondary | ICD-10-CM | POA: Diagnosis not present

## 2019-08-14 DIAGNOSIS — K219 Gastro-esophageal reflux disease without esophagitis: Secondary | ICD-10-CM | POA: Diagnosis present

## 2019-08-14 DIAGNOSIS — Z9049 Acquired absence of other specified parts of digestive tract: Secondary | ICD-10-CM | POA: Diagnosis not present

## 2019-08-14 DIAGNOSIS — R319 Hematuria, unspecified: Secondary | ICD-10-CM | POA: Diagnosis present

## 2019-08-14 DIAGNOSIS — E876 Hypokalemia: Secondary | ICD-10-CM

## 2019-08-14 DIAGNOSIS — E878 Other disorders of electrolyte and fluid balance, not elsewhere classified: Secondary | ICD-10-CM | POA: Diagnosis present

## 2019-08-14 DIAGNOSIS — B9620 Unspecified Escherichia coli [E. coli] as the cause of diseases classified elsewhere: Secondary | ICD-10-CM | POA: Diagnosis present

## 2019-08-14 DIAGNOSIS — K5790 Diverticulosis of intestine, part unspecified, without perforation or abscess without bleeding: Secondary | ICD-10-CM | POA: Diagnosis not present

## 2019-08-14 DIAGNOSIS — R7401 Elevation of levels of liver transaminase levels: Secondary | ICD-10-CM | POA: Diagnosis present

## 2019-08-14 DIAGNOSIS — Z79899 Other long term (current) drug therapy: Secondary | ICD-10-CM | POA: Diagnosis not present

## 2019-08-14 DIAGNOSIS — I809 Phlebitis and thrombophlebitis of unspecified site: Secondary | ICD-10-CM | POA: Diagnosis not present

## 2019-08-14 DIAGNOSIS — R197 Diarrhea, unspecified: Secondary | ICD-10-CM

## 2019-08-14 DIAGNOSIS — Z823 Family history of stroke: Secondary | ICD-10-CM | POA: Diagnosis not present

## 2019-08-14 DIAGNOSIS — I81 Portal vein thrombosis: Secondary | ICD-10-CM | POA: Diagnosis present

## 2019-08-14 DIAGNOSIS — K632 Fistula of intestine: Secondary | ICD-10-CM | POA: Diagnosis not present

## 2019-08-14 DIAGNOSIS — D638 Anemia in other chronic diseases classified elsewhere: Secondary | ICD-10-CM | POA: Diagnosis present

## 2019-08-14 DIAGNOSIS — K6819 Other retroperitoneal abscess: Secondary | ICD-10-CM | POA: Diagnosis present

## 2019-08-14 DIAGNOSIS — Z8719 Personal history of other diseases of the digestive system: Secondary | ICD-10-CM | POA: Diagnosis not present

## 2019-08-14 DIAGNOSIS — E871 Hypo-osmolality and hyponatremia: Secondary | ICD-10-CM | POA: Diagnosis present

## 2019-08-14 DIAGNOSIS — Z1629 Resistance to other single specified antibiotic: Secondary | ICD-10-CM | POA: Diagnosis present

## 2019-08-14 DIAGNOSIS — R188 Other ascites: Secondary | ICD-10-CM | POA: Diagnosis not present

## 2019-08-14 DIAGNOSIS — R935 Abnormal findings on diagnostic imaging of other abdominal regions, including retroperitoneum: Secondary | ICD-10-CM | POA: Diagnosis not present

## 2019-08-14 DIAGNOSIS — K651 Peritoneal abscess: Secondary | ICD-10-CM | POA: Diagnosis not present

## 2019-08-14 LAB — CBC
HCT: 31.2 % — ABNORMAL LOW (ref 39.0–52.0)
Hemoglobin: 9.7 g/dL — ABNORMAL LOW (ref 13.0–17.0)
MCH: 27 pg (ref 26.0–34.0)
MCHC: 31.1 g/dL (ref 30.0–36.0)
MCV: 86.9 fL (ref 80.0–100.0)
Platelets: 275 10*3/uL (ref 150–400)
RBC: 3.59 MIL/uL — ABNORMAL LOW (ref 4.22–5.81)
RDW: 13.1 % (ref 11.5–15.5)
WBC: 8.6 10*3/uL (ref 4.0–10.5)
nRBC: 0 % (ref 0.0–0.2)

## 2019-08-14 LAB — BASIC METABOLIC PANEL
Anion gap: 11 (ref 5–15)
BUN: 13 mg/dL (ref 6–20)
CO2: 23 mmol/L (ref 22–32)
Calcium: 7.7 mg/dL — ABNORMAL LOW (ref 8.9–10.3)
Chloride: 98 mmol/L (ref 98–111)
Creatinine, Ser: 0.8 mg/dL (ref 0.61–1.24)
GFR calc Af Amer: >60 mL/min (ref >60)
GFR calc non Af Amer: >60 mL/min (ref >60)
Glucose, Bld: 100 mg/dL — ABNORMAL HIGH (ref 70–99)
Potassium: 3.6 mmol/L (ref 3.5–5.1)
Sodium: 132 mmol/L — ABNORMAL LOW (ref 135–145)

## 2019-08-14 LAB — LACTIC ACID, PLASMA: Lactic Acid, Venous: 1.2 mmol/L (ref 0.5–1.9)

## 2019-08-14 MED ORDER — ACETAMINOPHEN 325 MG PO TABS: 650.0000 mg | ORAL_TABLET | Freq: Four times a day (QID) | ORAL | Status: DC | PRN

## 2019-08-14 MED ORDER — METHYLPREDNISOLONE SODIUM SUCC 40 MG IJ SOLR
20.0000 mg | Freq: Every day | INTRAMUSCULAR | Status: DC
  Administered 2019-08-14 – 2019-08-15 (×2): 20 mg via INTRAVENOUS
  Filled 2019-08-14 (×2): qty 1

## 2019-08-14 MED ORDER — LACTATED RINGERS IV BOLUS
1000.0000 mL | Freq: Once | INTRAVENOUS | Status: AC
  Administered 2019-08-14: 1000 mL via INTRAVENOUS

## 2019-08-14 MED ORDER — ONDANSETRON HCL 4 MG/2ML IJ SOLN: 4.0000 mg | Freq: Four times a day (QID) | INTRAMUSCULAR | Status: DC | PRN

## 2019-08-14 MED ORDER — POTASSIUM CHLORIDE IN NACL 20-0.9 MEQ/L-% IV SOLN
INTRAVENOUS | Status: DC
  Filled 2019-08-14 (×2): qty 1000

## 2019-08-14 MED ORDER — ONDANSETRON HCL 4 MG PO TABS: 4.0000 mg | ORAL_TABLET | Freq: Four times a day (QID) | ORAL | Status: DC | PRN

## 2019-08-14 MED ORDER — ACETAMINOPHEN 650 MG RE SUPP: 650.0000 mg | Freq: Four times a day (QID) | RECTAL | Status: DC | PRN

## 2019-08-14 NOTE — H&P (Addendum)
History and Physical  Brett Bright OBS:[id number] DOB: 15-Feb-1961 DOA: 08/13/2019   PCP: Patient, No Pcp Per   Patient coming from: Home  Chief Complaint: N/V/D  HPI:  Brett Bright is a 59 y.o. male with medical history of Ulcerative colitis, upper GI bleed, portal vein thrombus, GERD presenting with over 1 week history of nausea, vomiting, and diarrhea.  Patient was recently admitted to the hospital from 07/19/2019 to 07/20/2019 with ulcerative colitis flare.  He was discharged home with a prednisone taper.  He ultimately followed up with his gastroenterologist on 08/01/2019.  He has Lialda was increased to 4.8 g daily, and he was instructed to continue on his prednisone taper.  The patient states that he has tapered himself down to 10 mg of prednisone.  He began his 10 mg dose on 08/10/2019.  However, the patient has continued to have worsening nausea, vomiting, and diarrhea in the past week.  He denies any hematemesis, hematochezia, melena, fevers, chills.  He states that he has never had any abdominal pain even with his initial diagnosis of ulcerative colitis.  He states that he does not have any abdominal pain at this point.  He denies any fevers, chills, headache, chest pain, shortness breath, coughing, hemoptysis, hematemesis, dysuria, hematuria.  He denies any new medications.  He does not drink any alcohol or take any NSAIDs. In the emergency department, the patient has been afebrile and hemodynamically stable with oxygen saturation 96-100% on room air.  WBC was initially 14.9 with hemoglobin 11.4, and platelets 275,000.  Initial sodium was 127 with serum creatinine 0.79.  CT of the abdomen and pelvis showed Gas and fluid in the LEFT retroperitoneum and extending into peritoneal spaces via mesenteric pathways in the LEFT abdomen, associated with interloop fluid and gas as well as loculated collections in the mesorectum.  Because of the extensive distribution, septic thrombophlebitis  was favored associated with local colonic perforation.  There was gas noted along the IMV at the site of the previous thrombus into the left retroperitoneum. Initial contact was made with [facility] whom had accepted the patient in transfer.  However the patient continued to wait in the emergency department for over 24 hours.  Subsequently, general surgery, Dr. Aviva Signs was contacted.  He recommended transfer to a higher level of care.  He ultimately spoke with general surgery, Dr. Jaymes Graff, at [facility] and the plan is to transfer the patient to [facility] for further surgical evaluation.  The patient was started on intravenous Zosyn and IV fluids.  Assessment/Plan: Peritonitis secondary to perforated colon -CT abdomen and pelvis as discussed above -Lactic acid peaked 1.2 -General surgery, Dr. Aviva Signs, spoke with Dr. Jaymes Graff -plan is to transfer to [facility] with further surgical evaluation -Continue IV fluids -Continue IV Zosyn -Remain n.p.o. -The patient remains afebrile and hemodynamically stable  Ulcerative colitis -Switch to IV Solu-Medrol with increased dose for stress as the patient has been on a prednisone taper for the past month -Holding Lialda presently  Hyponatremia -Secondary to volume depletion -Continue IV fluids  Hypokalemia -Replete -Check magnesium       Past Medical History:  Diagnosis Date  . Ulcerative colitis with rectal bleeding ([place]) 07/2018   Past Surgical History:  Procedure Laterality Date  . APPENDECTOMY    . BIOPSY  08/16/2018   Procedure: BIOPSY;  Surgeon: Daneil Dolin, MD;  [facility];  Service: Endoscopy;;  . CHOLECYSTECTOMY    .  COLONOSCOPY N/A 03/23/2016   Dr. Oneida Alar: Thrombosed external hemorrhoid, internal hemorrhoids, diverticulosis, segmental moderate inflammation sigmoid colon secondary due to colitis with benign biopsies.  Next colonoscopy 5 years due to poor prep right colon.  . COLONOSCOPY N/A 08/16/2018   Dr.  Gala Romney: pan-ulcerative colitis, diverticulosis. Bx c/w UC  . ESOPHAGOGASTRODUODENOSCOPY N/A 03/23/2016   Dr. Oneida Alar: Gastritis, no H. pylori, multiple small sessile polyps biopsy consistent with fundic gland, mild duodenitis.  Marland Kitchen ESOPHAGOGASTRODUODENOSCOPY N/A 08/16/2018   Procedure: ESOPHAGOGASTRODUODENOSCOPY (EGD);  Surgeon: Daneil Dolin, MD;  Location: [facility];  Service: Endoscopy;  Laterality: N/A;  . VASECTOMY     Social History:  reports that he has never smoked. He has never used smokeless tobacco. He reports that he does not drink alcohol or use drugs.   Family History  Problem Relation Age of Onset  . Stroke Mother   . Cancer Maternal Grandfather        Unknown type of cancer  . Heart attack Paternal Grandmother   . Cancer Maternal Aunt        Possibly lung cancer  . Cancer Paternal Uncle        Unknown type of cancer  . Cancer Paternal Uncle        Unknown type of cancer  . Colon cancer Neg Hx      No Known Allergies   Prior to Admission medications   Medication Sig Start Date End Date Taking? Authorizing Provider  acetaminophen (TYLENOL) 325 MG tablet Take 325 mg by mouth every 6 (six) hours as needed for fever.   Yes [provider]  Ascorbic Acid (VITAMIN C PO) Take 1 tablet by mouth daily. Drops daily    Yes [provider]  ferrous sulfate (FEROSUL) 325 (65 FE) MG tablet Take 1 tablet (325 mg total) by mouth daily with breakfast. 07/20/19  Yes Johnson, Clanford L, MD  mesalamine (LIALDA) 1.2 g EC tablet Take 4 tablets (4.8 g total) by mouth daily with breakfast. 07/20/19  Yes Johnson, Clanford L, MD  omeprazole (PRILOSEC) 20 MG capsule Take 20 mg by mouth daily.   Yes [provider]  predniSONE (DELTASONE) 10 MG tablet Take 4 PO QAM x7days, 3 PO QAM x7days, 2 PO QAM x7days, 1 po QAM x 7 Patient taking differently: Take 10-40 mg by mouth See admin instructions. Take 40 mg every morning for 7 days, then 30 mg every morning for 7 days,  then 20 mg every morning for 7 days,  then 10 mg every morning for 7 days, then stop. 07/21/19  Yes Johnson, Clanford L, MD    Review of Systems:  Constitutional:  No weight loss, night sweats, Fevers, chills, fatigue.  Head&Eyes: No headache.  No vision loss.  No eye pain or scotoma ENT:  No Difficulty swallowing,Tooth/dental problems,Sore throat,  No ear ache, post nasal drip,  Cardio-vascular:  No chest pain, Orthopnea, PND, swelling in lower extremities,  dizziness, palpitations  GI:  No   hematochezia, melena, heartburn, indigestion, Resp:  No shortness of breath with exertion or at rest. No cough. No coughing up of blood .No wheezing.No chest wall deformity  Skin:  no rash or lesions.  GU:  no dysuria, change in color of urine, no urgency or frequency. No flank pain.  Musculoskeletal:  No joint pain or swelling. No decreased range of motion. No back pain.  Psych:  No change in mood or affect. No depression or anxiety. Neurologic: No headache, no dysesthesia, no focal weakness,  no vision loss. No syncope  Physical Exam: Vitals:   08/13/19 1800 08/13/19 2244 08/14/19 0315 08/14/19 0813  BP: 106/65 106/68 110/71 101/70  Pulse: 93  98 (!) 50  Resp:   16 16  Temp:   99 F (37.2 C) 98.2 F (36.8 C)  TempSrc:   Oral Oral  SpO2: 95%  96% 95%  Weight:      Height:       General:  A&O x 3, NAD, nontoxic, pleasant/cooperative Head/Eye: No conjunctival hemorrhage, no icterus, Melba/AT, No nystagmus ENT:  No icterus,  No thrush, good dentition, no pharyngeal exudate Neck:  No masses, no lymphadenpathy, no bruits CV:  RRR, no rub, no gallop, no S3 Lung:  CTAB, good air movement, no wheeze, no rhonchi Abdomen: soft/LLQ tender, +BS, nondistended, no peritoneal signs Ext: No cyanosis, No rashes, No petechiae, No lymphangitis, No edema Neuro: CNII-XII intact, strength 4/5 in bilateral upper and lower extremities, no dysmetria  Labs on Admission:  Basic Metabolic Panel: Recent  Labs  Lab 08/13/19 0609 08/14/19 0701  NA 127* 132*  K 3.2* 3.6  CL 93* 98  CO2 23 23  GLUCOSE 121* 100*  BUN 13 13  CREATININE 0.78 0.80  CALCIUM 8.1* 7.7*  MG 2.1  --    Liver Function Tests: Recent Labs  Lab 08/13/19 0609  AST 107*  ALT 143*  ALKPHOS 77  BILITOT 0.6  PROT 7.2  ALBUMIN 2.3*   Recent Labs  Lab 08/13/19 0609  LIPASE 51   No results for input(s): AMMONIA in the last 168 hours. CBC: Recent Labs  Lab 08/13/19 0609 08/14/19 0701  WBC 14.9* 8.6  NEUTROABS 13.0*  --   HGB 11.4* 9.7*  HCT 35.5* 31.2*  MCV 83.9 86.9  PLT 312 275   Coagulation Profile: No results for input(s): INR, PROTIME in the last 168 hours. Cardiac Enzymes: No results for input(s): CKTOTAL, CKMB, CKMBINDEX, TROPONINI in the last 168 hours. BNP: Invalid input(s): POCBNP CBG: No results for input(s): GLUCAP in the last 168 hours. Urine analysis:    Component Value Date/Time   COLORURINE YELLOW 08/13/2019 0831   APPEARANCEUR CLEAR 08/13/2019 0831   LABSPEC 1.008 08/13/2019 0831   PHURINE 6.0 08/13/2019 0831   GLUCOSEU NEGATIVE 08/13/2019 0831   HGBUR NEGATIVE 08/13/2019 0831   BILIRUBINUR NEGATIVE 08/13/2019 0831   KETONESUR NEGATIVE 08/13/2019 0831   PROTEINUR NEGATIVE 08/13/2019 0831   UROBILINOGEN 0.2 08/20/2012 0923   NITRITE NEGATIVE 08/13/2019 0831   LEUKOCYTESUR NEGATIVE 08/13/2019 0831   Sepsis Labs: @LABRCNTIP (procalcitonin:4,lacticidven:4) ) Recent Results (from the past 240 hour(s))  Culture, blood (routine x 2)     Status: None (Preliminary result)   Collection Time: 08/13/19 10:03 AM   Specimen: BLOOD RIGHT HAND  Result Value Ref Range Status   Specimen Description BLOOD RIGHT HAND  Final   Special Requests   Final    BOTTLES DRAWN AEROBIC AND ANAEROBIC Blood Culture adequate volume   Culture   Final    NO GROWTH < 24 HOURS Performed at [facility], [address], [place], [place] [address]    Report Status PENDING  Incomplete  Culture, blood  (routine x 2)     Status: None (Preliminary result)   Collection Time: 08/13/19 10:08 AM   Specimen: BLOOD LEFT HAND  Result Value Ref Range Status   Specimen Description BLOOD LEFT HAND  Final   Special Requests   Final    BOTTLES DRAWN AEROBIC AND ANAEROBIC Blood Culture adequate volume  Culture   Final    NO GROWTH < 24 HOURS Performed at [facility], [address], [place], [place] [address]    Report Status PENDING  Incomplete  SARS Coronavirus 2 by RT PCR (hospital order, performed in [facility] hospital lab) Nasopharyngeal Nasopharyngeal Swab     Status: None   Collection Time: 08/13/19 11:33 AM   Specimen: Nasopharyngeal Swab  Result Value Ref Range Status   SARS Coronavirus 2 NEGATIVE NEGATIVE Final    Comment: (NOTE) SARS-CoV-2 target nucleic acids are NOT DETECTED. The SARS-CoV-2 RNA is generally detectable in upper and lower respiratory specimens during the acute phase of infection. The lowest concentration of SARS-CoV-2 viral copies this assay can detect is 250 copies / mL. A negative result does not preclude SARS-CoV-2 infection and should not be used as the sole basis for treatment or other patient management decisions.  A negative result may occur with improper specimen collection / handling, submission of specimen other than nasopharyngeal swab, presence of viral mutation(s) within the areas targeted by this assay, and inadequate number of viral copies (<250 copies / mL). A negative result must be combined with clinical observations, patient history, and epidemiological information. Fact Sheet for Patients:   StrictlyIdeas.no Fact Sheet for Healthcare Providers: BankingDealers.co.za This test is not yet approved or cleared  by the [place] FDA and has been authorized for detection and/or diagnosis of SARS-CoV-2 by FDA under an Emergency Use Authorization (EUA).  This EUA will remain in effect (meaning this  test can be used) for the duration of the COVID-19 declaration under Section 564(b)(1) of the Act, 21 U.S.C. section 360bbb-3(b)(1), unless the authorization is terminated or revoked sooner. Performed at [facility], [address] [place], [place] [address]      Radiological Exams on Admission: CT Abdomen Pelvis W Contrast  Result Date: 08/13/2019 CLINICAL DATA:  Vomiting diarrhea EXAM: CT ABDOMEN AND PELVIS WITH CONTRAST TECHNIQUE: Multidetector CT imaging of the abdomen and pelvis was performed using the standard protocol following bolus administration of intravenous contrast. CONTRAST:  135m OMNIPAQUE IOHEXOL 300 MG/ML  SOLN COMPARISON:  07/19/2019 FINDINGS: Lower chest: Lung bases are clear. No pleural or pericardial effusion. Hepatobiliary: Mildly heterogeneous appearance of the anterior posterior division superior aspect RIGHT hemi liver likely related to prior portal thrombosis as before. Lobular hepatic contours. Post cholecystectomy. Pancreas: Pancreas is normal. No ductal dilation, visible lesion or inflammation. Spleen: Spleen is normal size without focal lesion. Adrenals/Urinary Tract: Adrenal glands are normal. Large RIGHT renal cyst and areas of mixed attenuation as described on the recent CT evaluation. No hydronephrosis. Stomach/Bowel: Signs of IMV thrombosis obscured by portal venous gas that now extends into the inferior mesenteric vein. Venous systems draining both the rectum and sigmoid colon show gas within the lumen with irregular enhancing gas and fluid containing collections that involves the mesorectum and extend along the LEFT flank with interloop fluid and gas extending to the LEFT upper quadrant adjacent to the jejunum (image 57, series 2 4.6 x 3.1 cm gas and fluid containing collection. Gas and fluid in the LEFT hemiabdomen beneath the sigmoid colon and descending transition measuring approximately 9 cm in greatest length and 2.6 cm and with. Other small pockets of fluid  and gas tracking in the sigmoid mesentery and beyond towards the jejunum as described. Collection in the mesorectum adjacent to the rectum along the LEFT lateral aspect of the rectum measures 4 x 3.7 cm. This and many of the collections in the sigmoid mesocolon  are ill-defined. Numerous lymph nodes throughout the retroperitoneum. Many of these are small. The largest in the LEFT retroperitoneum adjacent to the ureter and gonadal vessels measuring 1.3 cm. Vascular/Lymphatic: Gas dissecting around and likely within the lumen of the IMV which was previously shown to have thrombus in the lumen. Retroperitoneal nodal enlargement of a node adjacent to the LEFT common iliac and ureter as described. No pelvic adenopathy aside from this enlarged lymph node but with scattered lymph nodes throughout the sigmoid mesentery without pathologic enlargement but abnormal with respect to number. Reproductive: Heterogeneous prostate, nonspecific and unchanged. Other: Loculated retroperitoneal and interloop fluid collections as described. No free intraperitoneal air. Musculoskeletal: No acute musculoskeletal process. Spinal degenerative changes. IMPRESSION: 1. Gas and fluid in the LEFT retroperitoneum and extending into peritoneal spaces via mesenteric pathways in the LEFT abdomen, associated with interloop fluid and gas as well as loculated collections in the mesorectum. Extensive distribution of this process favors septic thrombophlebitis, likely associated with focal colonic perforation. Process best seen on image 76 of series 2 were gas along the IMV at the site of previous thrombus tracks into the LEFT retroperitoneum, extending superiorly and inferiorly from this epicenter are numerous gas and fluid containing collections. 2. Again with focal colonic thickening in the setting of UC colonic neoplasm is considered, as discussed on the previous exam. 3. Secondary enteritis due to extensive inflammation. Enteritis within the jejunum on  today's exam 4. Numerous lymph nodes throughout the retroperitoneum and sigmoid mesentery. Many of these are small and some are abnormal with respect to number. The largest in the LEFT retroperitoneum adjacent to the ureter and gonadal vessels measuring 1.3 cm. 5. Large RIGHT renal cyst and areas of mixed attenuation as described on the recent CT evaluation. Follow-up renal protocol CT when the patient is able is suggested to exclude the possibility of new enhancing lesions, solid renal neoplasm, as discussed on the previous CT evaluation. 6. Mildly heterogeneous appearance of the anterior posterior division superior aspect RIGHT hemi liver likely related to prior portal thrombosis as before. Critical Value/emergent results were called by telephone at the time of interpretation on 08/13/2019 at 8:58 am to provider Dr. Eulis Foster, Who verbally acknowledged these results. Electronically Signed   By: Zetta Bills M.D.   On: 08/13/2019 08:58        Time spent:70 minutes Code Status:   FULL Family Communication:  No Family at bedside Disposition Plan: expect 2-3day hospitalization Consults called: General Surgery DVT Prophylaxis:    SCDs  Orson Eva, DO  [facility] Pager [phone]  If 7PM-7AM, please contact night-coverage [email or web address] Password [facility] 08/14/2019, 9:07 AM

## 2019-08-14 NOTE — ED Provider Notes (Addendum)
Patient being monitored and reevaluated this morning.  Patient has mild abdominal discomfort no peritonitis.  Vital signs within normal limits.  Patient receiving IV antibiotics for retroperitoneal fluid/gas/perforation.  [facility] still does not have any beds this morning per report. Discussed with patient and he is comfortable staying in the [facility] system if possible.  Discussed with Dr. Georgette Dover regarding transfer to [facility] and he discussed directly with Dr. Arnoldo Morale. Discussed again with Dr. Arnoldo Morale and plan for admission to [facility] for continued IV antibiotics to the hospitalist and he will be on consult.  Reviewed recent CT scan results with concerning fluid and gas retroperitoneal findings.  Plan for continued IV antibiotics and monitoring.  CRITICAL CARE Performed by: Mariea Clonts   Total critical care time: 35 minutes  Critical care time was exclusive of separately billable procedures and treating other patients.  Critical care was necessary to treat or prevent imminent or life-threatening deterioration.  Critical care was time spent personally by me on the following activities: development of treatment plan with patient and/or surrogate as well as nursing, discussions with consultants, evaluation of patient's response to treatment, examination of patient, obtaining history from patient or surrogate, ordering and performing treatments and interventions, ordering and review of laboratory studies, ordering and review of radiographic studies, pulse oximetry and re-evaluation of patient's condition.     Elnora Morrison, MD 08/16/19 [id number]    Elnora Morrison, MD 08/16/19 [phone]

## 2019-08-14 NOTE — ED Notes (Signed)
Systolic BP 25'O notified EDP. New order for bolus LR

## 2019-08-14 NOTE — ED Provider Notes (Signed)
  Provider Note MRN:  [id number]  Arrival date & time: 08/14/19    ED Course and Medical Decision Making  Assumed care from Dr. Eulis Foster at shift change.  Awaiting transfer to [facility], bowel perforation, stable.  No beds at [facility].  Attempted ED to ED transfer but ED is full as well.  Would start considering other destinations if not improvement in the bed situation in the morning.  Signed out to oncoming provider at shift change.   .Critical Care Performed by: Maudie Flakes, MD Authorized by: Maudie Flakes, MD   Critical care provider statement:    Critical care time (minutes):  32   Critical care was necessary to treat or prevent imminent or life-threatening deterioration of the following conditions: bowel perforation.   Critical care was time spent personally by me on the following activities:  Discussions with consultants, evaluation of patient's response to treatment, examination of patient, ordering and performing treatments and interventions, ordering and review of laboratory studies, ordering and review of radiographic studies, pulse oximetry, re-evaluation of patient's condition, obtaining history from patient or surrogate and review of old charts   I assumed direction of critical care for this patient from another provider in my specialty: yes      Final Clinical Impressions(s) / ED Diagnoses     ICD-10-CM   1. Ulcerative colitis without complications, unspecified location ([place])  K51.90   2. Hypokalemia  E87.6   3. Nausea vomiting and diarrhea  R11.2    R19.7   4. Dehydration  E86.0   5. Perforated viscus  R19.8   6. Septic thrombophlebitis  I80.9     ED Discharge Orders    None      Discharge Instructions   None     Barth Kirks. [place] Small, [place] [email or web address]    Maudie Flakes, MD 08/14/19 [phone]

## 2019-08-14 NOTE — ED Provider Notes (Signed)
Call received from [facility], still no bed available.  Patient was reevaluated.  He is resting comfortably and states that the pain actually seems to be improving.  On exam, abdomen is soft and nontender but there is mild tenderness in the left flank area.  Peristalsis is decreased.  If [facility] is not able to accept the patient this morning, may need to look at other options for referral.   Delora Fuel, MD 82/51/89 [phone]

## 2019-08-15 DIAGNOSIS — R188 Other ascites: Secondary | ICD-10-CM

## 2019-08-15 DIAGNOSIS — K631 Perforation of intestine (nontraumatic): Secondary | ICD-10-CM

## 2019-08-15 DIAGNOSIS — E871 Hypo-osmolality and hyponatremia: Secondary | ICD-10-CM

## 2019-08-15 DIAGNOSIS — K651 Peritoneal abscess: Secondary | ICD-10-CM

## 2019-08-15 DIAGNOSIS — K51019 Ulcerative (chronic) pancolitis with unspecified complications: Secondary | ICD-10-CM

## 2019-08-15 DIAGNOSIS — E860 Dehydration: Secondary | ICD-10-CM

## 2019-08-15 DIAGNOSIS — E8809 Other disorders of plasma-protein metabolism, not elsewhere classified: Secondary | ICD-10-CM

## 2019-08-15 DIAGNOSIS — Z8719 Personal history of other diseases of the digestive system: Secondary | ICD-10-CM

## 2019-08-15 LAB — COMPREHENSIVE METABOLIC PANEL
ALT: 71 U/L — ABNORMAL HIGH (ref 0–44)
AST: 38 U/L (ref 15–41)
Albumin: 1.5 g/dL — ABNORMAL LOW (ref 3.5–5.0)
Alkaline Phosphatase: 49 U/L (ref 38–126)
Anion gap: 11 (ref 5–15)
BUN: 11 mg/dL (ref 6–20)
CO2: 23 mmol/L (ref 22–32)
Calcium: 7.9 mg/dL — ABNORMAL LOW (ref 8.9–10.3)
Chloride: 100 mmol/L (ref 98–111)
Creatinine, Ser: 0.78 mg/dL (ref 0.61–1.24)
GFR calc Af Amer: >60 mL/min (ref >60)
GFR calc non Af Amer: >60 mL/min (ref >60)
Glucose, Bld: 100 mg/dL — ABNORMAL HIGH (ref 70–99)
Potassium: 4.5 mmol/L (ref 3.5–5.1)
Sodium: 134 mmol/L — ABNORMAL LOW (ref 135–145)
Total Bilirubin: 0.4 mg/dL (ref 0.3–1.2)
Total Protein: 6 g/dL — ABNORMAL LOW (ref 6.5–8.1)

## 2019-08-15 LAB — SEDIMENTATION RATE: Sed Rate: 70 mm/hr — ABNORMAL HIGH (ref 0–16)

## 2019-08-15 LAB — C-REACTIVE PROTEIN: CRP: 10.1 mg/dL — ABNORMAL HIGH (ref <1.0)

## 2019-08-15 LAB — CBC
HCT: 31.9 % — ABNORMAL LOW (ref 39.0–52.0)
Hemoglobin: 10 g/dL — ABNORMAL LOW (ref 13.0–17.0)
MCH: 26.7 pg (ref 26.0–34.0)
MCHC: 31.3 g/dL (ref 30.0–36.0)
MCV: 85.3 fL (ref 80.0–100.0)
Platelets: 238 10*3/uL (ref 150–400)
RBC: 3.74 MIL/uL — ABNORMAL LOW (ref 4.22–5.81)
RDW: 12.8 % (ref 11.5–15.5)
WBC: 4.2 10*3/uL (ref 4.0–10.5)
nRBC: 0 % (ref 0.0–0.2)

## 2019-08-15 MED ORDER — BOOST / RESOURCE BREEZE PO LIQD CUSTOM
1.0000 | Freq: Three times a day (TID) | ORAL | Status: DC
  Administered 2019-08-15 – 2019-08-20 (×13): 1 via ORAL

## 2019-08-15 MED ORDER — SODIUM CHLORIDE 0.9 % IV SOLN: INTRAVENOUS | Status: AC

## 2019-08-15 MED ORDER — ADULT MULTIVITAMIN W/MINERALS CH
1.0000 | ORAL_TABLET | Freq: Every day | ORAL | Status: DC
  Administered 2019-08-15: 1 via ORAL
  Filled 2019-08-15: qty 1

## 2019-08-15 NOTE — Progress Notes (Signed)
PROGRESS NOTE    Brett Bright  GYJ:[id number] DOB: 1960-07-14 DOA: 08/13/2019 PCP: Patient, No Pcp Per (Confirm with patient/family/NH records and if not entered, this HAS to be entered at [facility] point of entry. "No PCP" if truly none.)   Chief Complaint  Patient presents with  . Emesis    Brief Narrative:  HPI per Dr. Abigail Miyamoto is a 59 y.o. male with medical history of Ulcerative colitis, upper GI bleed, portal vein thrombus, GERD presenting with over 1 week history of nausea, vomiting, and diarrhea.  Patient was recently admitted to the hospital from 07/19/2019 to 07/20/2019 with ulcerative colitis flare.  He was discharged home with a prednisone taper.  He ultimately followed up with his gastroenterologist on 08/01/2019.  He has Lialda was increased to 4.8 g daily, and he was instructed to continue on his prednisone taper.  The patient states that he has tapered himself down to 10 mg of prednisone.  He began his 10 mg dose on 08/10/2019.  However, the patient has continued to have worsening nausea, vomiting, and diarrhea in the past week.  He denies any hematemesis, hematochezia, melena, fevers, chills.  He states that he has never had any abdominal pain even with his initial diagnosis of ulcerative colitis.  He states that he does not have any abdominal pain at this point.  He denies any fevers, chills, headache, chest pain, shortness breath, coughing, hemoptysis, hematemesis, dysuria, hematuria.  He denies any new medications.  He does not drink any alcohol or take any NSAIDs. In the emergency department, the patient has been afebrile and hemodynamically stable with oxygen saturation 96-100% on room air.  WBC was initially 14.9 with hemoglobin 11.4, and platelets 275,000.  Initial sodium was 127 with serum creatinine 0.79.  CT of the abdomen and pelvis showed Gas and fluid in the LEFT retroperitoneum and extending into peritoneal spaces via mesenteric pathways in the LEFT abdomen,  associated with interloop fluid and gas as well as loculated collections in the mesorectum.  Because of the extensive distribution, septic thrombophlebitis was favored associated with local colonic perforation.  There was gas noted along the IMV at the site of the previous thrombus into the left retroperitoneum. Initial contact was made with [facility] whom had accepted the patient in transfer.  However the patient continued to wait in the emergency department for over 24 hours.  Subsequently, general surgery, Dr. Aviva Signs was contacted.  He recommended transfer to a higher level of care.  He ultimately spoke with general surgery, Dr. Jaymes Graff, at [facility] and the plan is to transfer the patient to [facility] for further surgical evaluation.  The patient was started on intravenous Zosyn and IV fluids.  Assessment & Plan:   Principal Problem:   Colon perforation ([place]) Active Problems:   Hyponatremia   Hypokalemia   Nausea vomiting and diarrhea   Ulcerative colitis (HCC)   Iron deficiency anemia due to chronic blood loss   Dehydration  1 concern for peritonitis secondary to perforated colon As noted on CT abdomen and pelvis.  Patient currently afebrile.  Normal white count.  Patient denies any abdominal pain.  Nausea vomiting diarrhea improving.  Patient initially seen by Dr. Arnoldo Morale, general surgery at [facility] who discussed case with general surgery at [facility] [place] and patient transferred to [facility] for further surgical evaluation.  Patient seen in consultation by general surgery this morning who feel patient is not toxic appearing and recommending  management patient's ulcerative colitis.  Continue IV Zosyn, IV Solu-Medrol.  Patient on a clear liquid diet.  Per general surgery no surgical needs at this time.  Fluid collection/phlegmon's noted on CT abdomen and pelvis per general surgery do not appear to be amenable to percutaneous drainage.  GI consulted who are  recommending IR evaluation for CT-guided pelvic/abdominal fluid aspiration with possible drain placement.  Patient seen in consultation by IR and patient will be n.p.o. after midnight for possible aspiration and drain placement by IR.  General surgery and GI following and appreciate input and recommendations.  2.  Ulcerative colitis flare Patient states some improvement with nausea and emesis.  Still with some loose stools however improving per patient.  Patient denies any abdominal pain.  CRP elevated at 10.1.  Sed rate elevated at 70.  Currently tolerating clear liquids.  Continue IV Zosyn, IV Solu-Medrol 20 mg daily.  GI following and I appreciate the input and recommendations.  3.  Hyponatremia Likely secondary to hypovolemic hyponatremia.  Improving with hydration.  Patient noted to have soft blood pressure with systolics in the 62X.  Change IV fluids to normal saline at 100 cc/h.  Follow.  4.  Hypokalemia Repleted.  Discontinue KCl and IV fluids.  Follow.  5.  Iron deficiency anemia anemia, unknown Likely secondary to problem #2.  Check an anemia panel.  Follow H&H.  Transfusion threshold hemoglobin less than 7.    DVT prophylaxis: SCDs Code Status full Family Communication: Updated patient.  No family at bedside. Disposition:   Status is: Inpatient    Dispo: The patient is from: Home              Anticipated d/c is to: Home              Anticipated d/c date is: To be determined.              Patient currently on IV antibiotics, IV steroids, being followed by general surgery and GI due to ulcerative colitis flare and concern for peritonitis.        Consultants:   General surgery: Dr. Olena Leatherwood [facility] 08/13/2019  General surgery: Dr. Nyoka Cowden [facility] 08/15/2019  Gastroenterology: Dr. Rush Landmark 08/15/2019  Procedures:   CT abdomen and pelvis 08/13/2019    Antimicrobials:   IV Zosyn 08/13/2019   Subjective: Sitting up in bed watching  television.  Denies any chest pain, no shortness of breath, no nausea or emesis today.  Tolerating clear liquids.  Denies any abdominal pain.  States had about 3-4 loose stools today.  States he is starting to feel better than on admission.  Objective: Vitals:   08/14/19 1940 08/14/19 1959 08/15/19 0137 08/15/19 0538  BP: 104/71  103/65 94/67  Pulse: 88  78 84  Resp: 16  19 14   Temp: 98.6 F (37 C)  97.8 F (36.6 C) (!) 97.5 F (36.4 C)  TempSrc: Oral  Oral Oral  SpO2: 97%  98% 98%  Weight:  92.5 kg    Height:  5' 9"  (1.753 m)      Intake/Output Summary (Last 24 hours) at 08/15/2019 2021 Last data filed at 08/15/2019 1718 Gross per 24 hour  Intake 1480 ml  Output --  Net 1480 ml   Filed Weights   08/13/19 0549 08/14/19 1959  Weight: 92.5 kg 92.5 kg    Examination:  General exam: Appears calm and comfortable  Respiratory system: Clear to auscultation. Respiratory effort normal. Cardiovascular system: S1 & S2  heard, RRR. No JVD, murmurs, rubs, gallops or clicks. No pedal edema. Gastrointestinal system: Abdomen is nondistended, soft and nontender. No organomegaly or masses felt. Normal bowel sounds heard. Central nervous system: Alert and oriented. No focal neurological deficits. Extremities: Symmetric 5 x 5 power. Skin: No rashes, lesions or ulcers Psychiatry: Judgement and insight appear normal. Mood & affect appropriate.     Data Reviewed: I have personally reviewed following labs and imaging studies  CBC: Recent Labs  Lab 08/13/19 0609 08/14/19 0701 08/15/19 0230  WBC 14.9* 8.6 4.2  NEUTROABS 13.0*  --   --   HGB 11.4* 9.7* 10.0*  HCT 35.5* 31.2* 31.9*  MCV 83.9 86.9 85.3  PLT 312 275 272    Basic Metabolic Panel: Recent Labs  Lab 08/13/19 0609 08/14/19 0701 08/15/19 0230  NA 127* 132* 134*  K 3.2* 3.6 4.5  CL 93* 98 100  CO2 23 23 23   GLUCOSE 121* 100* 100*  BUN 13 13 11   CREATININE 0.78 0.80 0.78  CALCIUM 8.1* 7.7* 7.9*  MG 2.1  --   --      GFR: Estimated Creatinine Clearance: 113 mL/min (by C-G formula based on SCr of 0.78 mg/dL).  Liver Function Tests: Recent Labs  Lab 08/13/19 0609 08/15/19 0230  AST 107* 38  ALT 143* 71*  ALKPHOS 77 49  BILITOT 0.6 0.4  PROT 7.2 6.0*  ALBUMIN 2.3* 1.5*    CBG: No results for input(s): GLUCAP in the last 168 hours.   Recent Results (from the past 240 hour(s))  Culture, blood (routine x 2)     Status: None (Preliminary result)   Collection Time: 08/13/19 10:03 AM   Specimen: BLOOD RIGHT HAND  Result Value Ref Range Status   Specimen Description BLOOD RIGHT HAND  Final   Special Requests   Final    BOTTLES DRAWN AEROBIC AND ANAEROBIC Blood Culture adequate volume   Culture   Final    NO GROWTH 2 DAYS Performed at [facility], [address] [place], [place] [address]    Report Status PENDING  Incomplete  Culture, blood (routine x 2)     Status: None (Preliminary result)   Collection Time: 08/13/19 10:08 AM   Specimen: BLOOD LEFT HAND  Result Value Ref Range Status   Specimen Description BLOOD LEFT HAND  Final   Special Requests   Final    BOTTLES DRAWN AEROBIC AND ANAEROBIC Blood Culture adequate volume   Culture   Final    NO GROWTH 2 DAYS Performed at [facility], [address], [place], [place] [address]    Report Status PENDING  Incomplete  SARS Coronavirus 2 by RT PCR (hospital order, performed in [place] hospital lab) Nasopharyngeal Nasopharyngeal Swab     Status: None   Collection Time: 08/13/19 11:33 AM   Specimen: Nasopharyngeal Swab  Result Value Ref Range Status   SARS Coronavirus 2 NEGATIVE NEGATIVE Final    Comment: (NOTE) SARS-CoV-2 target nucleic acids are NOT DETECTED. The SARS-CoV-2 RNA is generally detectable in upper and lower respiratory specimens during the acute phase of infection. The lowest concentration of SARS-CoV-2 viral copies this assay can detect is 250 copies / mL. A negative result does not preclude SARS-CoV-2  infection and should not be used as the sole basis for treatment or other patient management decisions.  A negative result may occur with improper specimen collection / handling, submission of specimen other than nasopharyngeal swab, presence of viral mutation(s) within the areas targeted by  this assay, and inadequate number of viral copies (<250 copies / mL). A negative result must be combined with clinical observations, patient history, and epidemiological information. Fact Sheet for Patients:   StrictlyIdeas.no Fact Sheet for Healthcare Providers: BankingDealers.co.za This test is not yet approved or cleared  by the [place] FDA and has been authorized for detection and/or diagnosis of SARS-CoV-2 by FDA under an Emergency Use Authorization (EUA).  This EUA will remain in effect (meaning this test can be used) for the duration of the COVID-19 declaration under Section 564(b)(1) of the Act, 21 U.S.C. section 360bbb-3(b)(1), unless the authorization is terminated or revoked sooner. Performed at [facility], [address] [place], [place] [address]          Radiology Studies: No results found.      Scheduled Meds: . feeding supplement  1 Container Oral TID BM  . methylPREDNISolone (SOLU-MEDROL) injection  20 mg Intravenous Daily  . multivitamin with minerals  1 tablet Oral Daily   Continuous Infusions: . sodium chloride 75 mL/hr at 08/15/19 1400  . piperacillin-tazobactam 3.375 g (08/15/19 1406)     LOS: 1 day    Time spent: 35 minutes    Irine Seal, MD [facility]   To contact the attending provider between 7A-7P or the covering provider during after hours 7P-7A, please log into the web site [email or web address] and access using universal [place] password for that web site. If you do not have the password, please call the hospital operator.  08/15/2019, 8:21 PM

## 2019-08-15 NOTE — H&P (Signed)
Chief Complaint: Patient was seen in consultation today for  Chief Complaint  Patient presents with  . Emesis    Referring Physician(s): Ellouise Newer, PA-C, Gastroenterology  Supervising Physician: Aletta Edouard  Patient Status: [facility] - In-pt  History of Present Illness: Brett Bright is a 59 y.o. male with a medical history of ulcerative colitis, upper GI bleed, portal vein thrombosis and GERD. He presented to the ED with nausea, vomiting and diarrhea.   CT scan 07/24/19: 1. Gas and fluid in the LEFT retroperitoneum and extending into peritoneal spaces via mesenteric pathways in the LEFT abdomen, associated with interloop fluid and gas as well as loculated collections in the mesorectum. Extensive distribution of this process favors septic thrombophlebitis, likely associated with focal colonic perforation. Process best seen on image 76 of series 2 were gas along the IMV at the site of previous thrombus tracks into the LEFT retroperitoneum, extending superiorly and inferiorly from this epicenter are numerous gas and fluid containing collections.  Patient recently admitted April 2021 with an ulcerative colitis flare up and discharged on a prednisone taper.   Interventional Radiology has been asked to evaluate this patient for intra-abdominal/pelvic fluid collection aspirations with possible drain placements.    Past Medical History:  Diagnosis Date  . Ulcerative colitis with rectal bleeding ([facility]) 07/2018    Past Surgical History:  Procedure Laterality Date  . APPENDECTOMY    . BIOPSY  08/16/2018   Procedure: BIOPSY;  Surgeon: Daneil Dolin, MD;  Location: [facility];  Service: Endoscopy;;  . CHOLECYSTECTOMY    . COLONOSCOPY N/A 03/23/2016   Dr. Oneida Alar: Thrombosed external hemorrhoid, internal hemorrhoids, diverticulosis, segmental moderate inflammation sigmoid colon secondary due to colitis with benign biopsies.  Next colonoscopy 5 years due to poor prep right  colon.  . COLONOSCOPY N/A 08/16/2018   Dr. Gala Romney: pan-ulcerative colitis, diverticulosis. Bx c/w UC  . ESOPHAGOGASTRODUODENOSCOPY N/A 03/23/2016   Dr. Oneida Alar: Gastritis, no H. pylori, multiple small sessile polyps biopsy consistent with fundic gland, mild duodenitis.  Marland Kitchen ESOPHAGOGASTRODUODENOSCOPY N/A 08/16/2018   Procedure: ESOPHAGOGASTRODUODENOSCOPY (EGD);  Surgeon: Daneil Dolin, MD;  Location: [facility];  Service: Endoscopy;  Laterality: N/A;  . VASECTOMY      Allergies: Patient has no known allergies.  Medications: Prior to Admission medications   Medication Sig Start Date End Date Taking? Authorizing Provider  acetaminophen (TYLENOL) 325 MG tablet Take 325 mg by mouth every 6 (six) hours as needed for fever.   Yes [provider]  Ascorbic Acid (VITAMIN C PO) Take 1 tablet by mouth daily. Drops daily    Yes [provider]  ferrous sulfate (FEROSUL) 325 (65 FE) MG tablet Take 1 tablet (325 mg total) by mouth daily with breakfast. 07/20/19  Yes Johnson, Clanford L, MD  mesalamine (LIALDA) 1.2 g EC tablet Take 4 tablets (4.8 g total) by mouth daily with breakfast. 07/20/19  Yes Johnson, Clanford L, MD  omeprazole (PRILOSEC) 20 MG capsule Take 20 mg by mouth daily.   Yes [provider]  predniSONE (DELTASONE) 10 MG tablet Take 4 PO QAM x7days, 3 PO QAM x7days, 2 PO QAM x7days, 1 po QAM x 7 Patient taking differently: Take 10-40 mg by mouth See admin instructions. Take 40 mg every morning for 7 days, then 30 mg every morning for 7 days, then 20 mg every morning for 7 days,  then 10 mg every morning for 7 days, then stop. 07/21/19  Yes Murlean Iba, MD  Family History  Problem Relation Age of Onset  . Stroke Mother   . Cancer Maternal Grandfather        Unknown type of cancer  . Heart attack Paternal Grandmother   . Cancer Maternal Aunt        Possibly lung cancer  . Cancer Paternal Uncle        Unknown type of cancer  . Cancer Paternal Uncle         Unknown type of cancer  . Colon cancer Neg Hx     Social History   Socioeconomic History  . Marital status: Married    Spouse name: Not on file  . Number of children: 3  . Years of education: Not on file  . Highest education level: Not on file  Occupational History  . Occupation: Primary school teacher    Comment: At [employer]   Tobacco Use  . Smoking status: Never Smoker  . Smokeless tobacco: Never Used  Substance and Sexual Activity  . Alcohol use: No  . Drug use: No  . Sexual activity: Not on file  Other Topics Concern  . 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   He is married and has 3 children, who are ages 71, 45, and 63 as of 2017.   The 76 and 68 year old children are still living at home, but the 58 yo is married and lives elsewhere.   The patient lives in [place], [place].      His primary care is [facility] in [place], [place].   Social Determinants of Health   Financial Resource Strain:   . Difficulty of Paying Living Expenses:   Food Insecurity:   . Worried About Charity fundraiser in the Last Year:   . Arboriculturist in the Last Year:   Transportation Needs:   . Film/video editor (Medical):   Marland Kitchen Lack of Transportation (Non-Medical):   Physical Activity:   . Days of Exercise per Week:   . Minutes of Exercise per Session:   Stress:   . Feeling of Stress :   Social Connections:   . Frequency of Communication with Friends and Family:   . Frequency of Social Gatherings with Friends and Family:   . Attends Religious Services:   . Active Member of Clubs or Organizations:   . Attends Archivist Meetings:   Marland Kitchen Marital Status:     Review of Systems: A 12 point ROS discussed and pertinent positives are indicated in the HPI above.  All other systems are negative.  Review of Systems  Constitutional: Positive for appetite change.  Respiratory: Negative for cough  and shortness of breath.   Cardiovascular: Negative for chest pain and leg swelling.  Gastrointestinal: Positive for diarrhea. Negative for abdominal pain, nausea and vomiting.  Musculoskeletal: Negative for back pain.  Neurological: Negative for headaches.    Vital Signs: BP 94/67 (BP Location: Right Arm)   Pulse 84   Temp (!) 97.5 F (36.4 C) (Oral)   Resp 14   Ht 5' 9"  (1.753 m)   Wt 203 lb 14.8 oz (92.5 kg)   SpO2 98%   BMI 30.11 kg/m   Physical Exam Constitutional:      General: He is not in acute distress.    Appearance: Normal appearance.  Cardiovascular:     Rate and Rhythm: Normal rate and regular  rhythm.     Pulses: Normal pulses.     Heart sounds: Normal heart sounds.  Pulmonary:     Effort: Pulmonary effort is normal.     Breath sounds: Normal breath sounds.  Abdominal:     Palpations: Abdomen is soft.     Tenderness: There is no abdominal tenderness.  Musculoskeletal:        General: Normal range of motion.  Skin:    General: Skin is warm and dry.  Neurological:     Mental Status: He is alert and oriented to person, place, and time.  Psychiatric:        Mood and Affect: Mood normal.        Behavior: Behavior normal.        Thought Content: Thought content normal.        Judgment: Judgment normal.     Imaging: CT Abdomen Pelvis W Contrast  Result Date: 08/13/2019 CLINICAL DATA:  Vomiting diarrhea EXAM: CT ABDOMEN AND PELVIS WITH CONTRAST TECHNIQUE: Multidetector CT imaging of the abdomen and pelvis was performed using the standard protocol following bolus administration of intravenous contrast. CONTRAST:  174m OMNIPAQUE IOHEXOL 300 MG/ML  SOLN COMPARISON:  07/19/2019 FINDINGS: Lower chest: Lung bases are clear. No pleural or pericardial effusion. Hepatobiliary: Mildly heterogeneous appearance of the anterior posterior division superior aspect RIGHT hemi liver likely related to prior portal thrombosis as before. Lobular hepatic contours. Post  cholecystectomy. Pancreas: Pancreas is normal. No ductal dilation, visible lesion or inflammation. Spleen: Spleen is normal size without focal lesion. Adrenals/Urinary Tract: Adrenal glands are normal. Large RIGHT renal cyst and areas of mixed attenuation as described on the recent CT evaluation. No hydronephrosis. Stomach/Bowel: Signs of IMV thrombosis obscured by portal venous gas that now extends into the inferior mesenteric vein. Venous systems draining both the rectum and sigmoid colon show gas within the lumen with irregular enhancing gas and fluid containing collections that involves the mesorectum and extend along the LEFT flank with interloop fluid and gas extending to the LEFT upper quadrant adjacent to the jejunum (image 57, series 2 4.6 x 3.1 cm gas and fluid containing collection. Gas and fluid in the LEFT hemiabdomen beneath the sigmoid colon and descending transition measuring approximately 9 cm in greatest length and 2.6 cm and with. Other small pockets of fluid and gas tracking in the sigmoid mesentery and beyond towards the jejunum as described. Collection in the mesorectum adjacent to the rectum along the LEFT lateral aspect of the rectum measures 4 x 3.7 cm. This and many of the collections in the sigmoid mesocolon are ill-defined. Numerous lymph nodes throughout the retroperitoneum. Many of these are small. The largest in the LEFT retroperitoneum adjacent to the ureter and gonadal vessels measuring 1.3 cm. Vascular/Lymphatic: Gas dissecting around and likely within the lumen of the IMV which was previously shown to have thrombus in the lumen. Retroperitoneal nodal enlargement of a node adjacent to the LEFT common iliac and ureter as described. No pelvic adenopathy aside from this enlarged lymph node but with scattered lymph nodes throughout the sigmoid mesentery without pathologic enlargement but abnormal with respect to number. Reproductive: Heterogeneous prostate, nonspecific and unchanged.  Other: Loculated retroperitoneal and interloop fluid collections as described. No free intraperitoneal air. Musculoskeletal: No acute musculoskeletal process. Spinal degenerative changes. IMPRESSION: 1. Gas and fluid in the LEFT retroperitoneum and extending into peritoneal spaces via mesenteric pathways in the LEFT abdomen, associated with interloop fluid and gas as well as loculated collections in the mesorectum. Extensive  distribution of this process favors septic thrombophlebitis, likely associated with focal colonic perforation. Process best seen on image 76 of series 2 were gas along the IMV at the site of previous thrombus tracks into the LEFT retroperitoneum, extending superiorly and inferiorly from this epicenter are numerous gas and fluid containing collections. 2. Again with focal colonic thickening in the setting of UC colonic neoplasm is considered, as discussed on the previous exam. 3. Secondary enteritis due to extensive inflammation. Enteritis within the jejunum on today's exam 4. Numerous lymph nodes throughout the retroperitoneum and sigmoid mesentery. Many of these are small and some are abnormal with respect to number. The largest in the LEFT retroperitoneum adjacent to the ureter and gonadal vessels measuring 1.3 cm. 5. Large RIGHT renal cyst and areas of mixed attenuation as described on the recent CT evaluation. Follow-up renal protocol CT when the patient is able is suggested to exclude the possibility of new enhancing lesions, solid renal neoplasm, as discussed on the previous CT evaluation. 6. Mildly heterogeneous appearance of the anterior posterior division superior aspect RIGHT hemi liver likely related to prior portal thrombosis as before. Critical Value/emergent results were called by telephone at the time of interpretation on 08/13/2019 at 8:58 am to provider Dr. Eulis Foster, Who verbally acknowledged these results. Electronically Signed   By: Zetta Bills M.D.   On: 08/13/2019 08:58    CT ABDOMEN PELVIS W CONTRAST  Addendum Date: 07/19/2019   ADDENDUM REPORT: 07/19/2019 17:37 ADDENDUM: Critical Value/emergent results were called by telephone at the time of interpretation on 07/19/2019 at 5:37 pm to provider Kem Parkinson, who verbally acknowledged these results. Electronically Signed   By: Zetta Bills M.D.   On: 07/19/2019 17:37   Result Date: 07/19/2019 CLINICAL DATA:  Abdominal abscess, infection suspected in the setting of ulcerative colitis EXAM: CT ABDOMEN AND PELVIS WITH CONTRAST TECHNIQUE: Multidetector CT imaging of the abdomen and pelvis was performed using the standard protocol following bolus administration of intravenous contrast. CONTRAST:  133m OMNIPAQUE IOHEXOL 300 MG/ML  SOLN COMPARISON:  03/22/2016 FINDINGS: Lower chest: Lung bases are clear. No consolidation. No pleural effusion. Hepatobiliary: Lobular hepatic contours with fissural widening. Signs of low-density with linear pattern tracking into the RIGHT hepatic lobe. This is a vascular pattern in likely relates to prior portal vein thrombosis that was present in 2018. Post cholecystectomy without significant biliary ductal distension. Pancreas: Pancreas is normal. Spleen: Spleen is normal size without focal lesion. Adrenals/Urinary Tract: Adrenal glands are normal. Large RIGHT renal cyst is increased in size arising from lower pole without suspicious features measuring 12 x 11 cm as compared to 11 cm greatest axial dimension. There is no hydronephrosis. Multiple RIGHT renal lesions of variable density most reflecting hemorrhagic cysts. Some new however compared to exam dating back to 2014. The largest hyperdense lesion along the anterior margin measured 80 Hounsfield units in 2014 and is compatible with hemorrhagic cyst. Other small lesions higher density are indeterminate and potentially enhancing based on this evaluation. Upper pole cyst on the LEFT density 6 Hounsfield units. No hydronephrosis. Urinary bladder  is normal. Stomach/Bowel: Stomach and small bowel are normal. Positive contrast fills the gastrointestinal tract. Segmental thickening of the sigmoid colon with marked pericolonic stranding. Contrast mixed with gas along the posterior wall likely intramural sinus tract. (Image 78, series 3 through image 73, series 3 going from inferior to superior. Focal outpouching along the inferior margin of this area may also represent a focus of intramural sinus tract formation best seen on  coronal images. Extensive pericolonic inflammation. Numerous lymph nodes within the colonic mesentery largest along the LEFT psoas muscle (image 67, series 3) 1.6 cm in short axis dimension. Thrombosis of the superior rectal vein and a portion of the IMV in this patient with history of prior portal thrombus suspicious for septic thrombophlebitis Vascular/Lymphatic: Normal caliber abdominal aorta. No additional adenopathy in the retroperitoneum or in the upper abdomen. No pelvic lymphadenopathy. Reproductive: Prostate is heterogeneous. Other: No sign of free air. Extensive sigmoid stranding, worse than on the previous exam. Musculoskeletal: No acute bone finding. No destructive bone process. IMPRESSION: 1. Signs of worsening acute on chronic inflammation, sinus tract formation in the wall of the colon and increased pericolonic stranding in this patient with ulcerative colitis. 2. 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 Inflammation within the colon likely leading to septic thrombophlebitis. 3. Given the marked colonic thickening the possibility of underlying neoplasm remains a consideration and is seen in the setting of increasing size of retroperitoneal lymph nodes. Follow-up colonoscopy is suggested. 4. Lobular hepatic contours raise the question of liver disease/fibrosis or cirrhosis. Laboratory and clinical correlation may be helpful. Scarring in the liver from prior RIGHT portal thrombosis. 5. Multiple RIGHT renal  lesions of variable density most reflecting hemorrhagic cysts. Some new however compared to exam dating back to 2014. Other small lesions higher density are indeterminate and potentially enhancing based on this evaluation. Consider follow-up renal protocol CT or MRI on a nonemergent basis, within 3-6 months. Call is out to the referring provider to further discuss findings in the above case. Electronically Signed: By: Zetta Bills M.D. On: 07/19/2019 17:30    Labs:  CBC: Recent Labs    07/20/19 0445 08/13/19 0609 08/14/19 0701 08/15/19 0230  WBC 5.8 14.9* 8.6 4.2  HGB 11.6* 11.4* 9.7* 10.0*  HCT 36.5* 35.5* 31.2* 31.9*  PLT 159 312 275 238    COAGS: No results for input(s): INR, APTT in the last 8760 hours.  BMP: Recent Labs    07/20/19 0445 08/13/19 0609 08/14/19 0701 08/15/19 0230  NA 134* 127* 132* 134*  K 4.1 3.2* 3.6 4.5  CL 101 93* 98 100  CO2 25 23 23 23   GLUCOSE 122* 121* 100* 100*  BUN 15 13 13 11   CALCIUM 8.3* 8.1* 7.7* 7.9*  CREATININE 0.81 0.78 0.80 0.78  GFRNONAA >60 >60 >60 >60  GFRAA >60 >60 >60 >60    LIVER FUNCTION TESTS: Recent Labs    07/19/19 0923 07/20/19 0445 08/13/19 0609 08/15/19 0230  BILITOT 1.1 0.5 0.6 0.4  AST 58* 42* 107* 38  ALT 51* 42 143* 71*  ALKPHOS 95 69 77 49  PROT 7.7 6.7 7.2 6.0*  ALBUMIN 3.2* 2.6* 2.3* 1.5*    TUMOR MARKERS: No results for input(s): AFPTM, CEA, CA199, CHROMGRNA in the last 8760 hours.  Assessment and Plan: ABUNDIO TEUSCHER is a 59 y.o. male with a medical history of ulcerative colitis, upper GI bleed, portal vein thrombosis and GERD. He presented to the ED with nausea, vomiting and diarrhea.   CT Scan done 08/13/19 was positive for gas and fluid collections in the left abdomen. The patient is scheduled to be seen 08/16/19 in IR for a CT-guided pelvic/abdominal fluid aspiration with possible drain placement. Dr. Earleen Newport and reviewed the imaging.   Risks and benefits discussed with the patient  including bleeding, infection, damage to adjacent structures, bowel perforation/fistula connection, and sepsis.  All of the patient's questions were answered, patient is  agreeable to proceed. Consent signed and in chart.  The patient is currently receiving 3.375 grams IV Zosyn every 8 hours.    Thank you for this interesting consult.  I greatly enjoyed meeting Brett Bright and look forward to participating in their care.  A copy of this report was sent to the requesting provider on this date.  Electronically Signed: Theresa Duty, NP 08/15/2019, 3:55 PM   I spent a total of 20 Minutes in face to face in clinical consultation, greater than 50% of which was counseling/coordinating care for pelvic/abdominal fluid aspiration with possible drain placement.

## 2019-08-15 NOTE — Progress Notes (Signed)
Initial Nutrition Assessment  DOCUMENTATION CODES:   Not applicable  INTERVENTION:   -Boost Breeze po TID, each supplement provides 250 kcal and 9 grams of protein -MVI with minerals daily -RD will follow for diet advancement and adjust supplement regimen as appropriate  NUTRITION DIAGNOSIS:   Increased nutrient needs related to acute illness(peritonitis and intra-abdominal abscess) as evidenced by estimated needs.  GOAL:   Patient will meet greater than or equal to 90% of their needs  MONITOR:   PO intake, Supplement acceptance, Diet advancement, Labs, Weight trends, Skin, I & O's  REASON FOR ASSESSMENT:   Malnutrition Screening Tool    ASSESSMENT:   Brett Bright is a 59 y.o. male with medical history ofUlcerative colitis, upper GI bleed, portal vein thrombus, GERD presenting with over 1 week history of nausea, vomiting, and diarrhea.  Pt admitted with peritonitis secondary to perforated colon.   Reviewed I/O's: +1.7 L x 24 hours and +5.5L since admission  Per general surgery notes, fluid collections/phlegmons do not appear amenable to percutaneous drainage.   Attempted to speak with pt x 3; at two attempts pt in with MD at time of visit. Also attempted to speak with pt via phone, however, no answer. RD unable to obtain further nutrition-related history or complete nutrition-focused physical exam at this time.   Pt just advanced to a clear liquid diet. No meal completion records available at this time. Pt would greatly benefit from oral nutrition supplements due to limitation of diet.   Reviewed wt hx; pt has experienced a 7.4% wt loss over the past month, which is significant for time frame.   Medications reviewed and include IV solu-medrol, 0.0 NaCl with KCl 20 mEq/L infusion @ 75 ml/hr.   Labs reviewed: Na: 134.   Diet Order:   Diet Order            Diet clear liquid Room service appropriate? Yes; Fluid consistency: Thin  Diet effective now               EDUCATION NEEDS:   Not appropriate for education at this time  Skin:  Skin Assessment: Reviewed RN Assessment  Last BM:  08/15/19  Height:   Ht Readings from Last 1 Encounters:  08/14/19 5' 9"  (1.753 m)    Weight:   Wt Readings from Last 1 Encounters:  08/14/19 92.5 kg    Ideal Body Weight:  72.7 kg  BMI:  Body mass index is 30.11 kg/m.  Estimated Nutritional Needs:   Kcal:  2000-2200  Protein:  110-125 grams  Fluid:  > 2 L    [name], RD, LDN, [place] Registered Dietitian II Certified Diabetes Care and Education Specialist Please refer to [facility] for RD and/or RD on-call/weekend/after hours pager

## 2019-08-15 NOTE — Consult Note (Addendum)
[facility] Surgery Consult Note  Brett Bright [facility] 1960-05-21  [id number].    Requesting MD: Tat, MD Chief Complaint/Reason for Consult: intraabdominal abscess HPI:  [name] is a 59 y/o male with a PMH UC (Dx 07/2018), PVT (2018), GERD, and laparoscopic converted to open cholecystectomy who presented [facility] for management of UC with left intra-abdominal fluid collection.   Patient reports a history of nausea and emesis dating back to 2018 when he had is gallbladder removed and was also found to have a PVT managed with subcutaneous Lovenox injections. Colonoscopy in 2018 revealed thrombosed external hemorrhoids and descending colitis with diverticula. In 07/2018 he underwent colonoscopy and was found to have pan-colitis and was diagnosed with UC. His UC has been managed with daily iron, pepcid, and mesalamine (Lialda). At the end of last month he reports acute onset of nausea/vomiting so he saw his GI physician who thought the patient may have food poisoning. When the patient did not improve he went to the ED at [facility] and CT of the Abdomen and pelvis 07/19/18 showed signs of worsening acute on chronic inflammation of the colon without perforation or fluid collection. 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 The patient was started on a prednisone taper and sent home. After this his GI physician increased his Lialda.  The patient now presents with worsening nausea and bilious emesis over the last month. Other associated sxs include non-bloody diarrhea 3-4x daily and hematuria. He reports poor oral intake for the last month. He denies abdominal pain. He currently works for [employer] and lives at home with his wife. He denies tobacco, alcohol, or drug use.   ROS: Review of Systems  Constitutional: Negative for chills, diaphoresis and fever.  HENT: Negative.   Eyes: Negative.   Respiratory: Negative.   Cardiovascular: Negative.   Gastrointestinal:  Positive for diarrhea, nausea and vomiting. Negative for abdominal pain, blood in stool, constipation and melena.  Genitourinary: Positive for hematuria. Negative for dysuria, flank pain, frequency and urgency.  Musculoskeletal: Negative.   Skin: Negative.   Neurological: Negative.   Endo/Heme/Allergies: Negative.   Psychiatric/Behavioral: Negative.     Family History  Problem Relation Age of Onset  . Stroke Mother   . Cancer Maternal Grandfather        Unknown type of cancer  . Heart attack Paternal Grandmother   . Cancer Maternal Aunt        Possibly lung cancer  . Cancer Paternal Uncle        Unknown type of cancer  . Cancer Paternal Uncle        Unknown type of cancer  . Colon cancer Neg Hx     Past Medical History:  Diagnosis Date  . Ulcerative colitis with rectal bleeding ([place]) 07/2018    Past Surgical History:  Procedure Laterality Date  . APPENDECTOMY    . BIOPSY  08/16/2018   Procedure: BIOPSY;  Surgeon: Daneil Dolin, MD;  Location: [facility];  Service: Endoscopy;;  . CHOLECYSTECTOMY    . COLONOSCOPY N/A 03/23/2016   Dr. Oneida Alar: Thrombosed external hemorrhoid, internal hemorrhoids, diverticulosis, segmental moderate inflammation sigmoid colon secondary due to colitis with benign biopsies.  Next colonoscopy 5 years due to poor prep right colon.  . COLONOSCOPY N/A 08/16/2018   Dr. Gala Romney: pan-ulcerative colitis, diverticulosis. Bx c/w UC  . ESOPHAGOGASTRODUODENOSCOPY N/A 03/23/2016   Dr. Oneida Alar: Gastritis, no H. pylori, multiple small sessile polyps biopsy consistent with fundic gland, mild duodenitis.  Marland Kitchen  ESOPHAGOGASTRODUODENOSCOPY N/A 08/16/2018   Procedure: ESOPHAGOGASTRODUODENOSCOPY (EGD);  Surgeon: Daneil Dolin, MD;  Location: [facility];  Service: Endoscopy;  Laterality: N/A;  . VASECTOMY      Social History:  reports that he has never smoked. He has never used smokeless tobacco. He reports that he does not drink alcohol or use drugs.  Allergies: No  Known Allergies  Medications Prior to Admission  Medication Sig Dispense Refill  . acetaminophen (TYLENOL) 325 MG tablet Take 325 mg by mouth every 6 (six) hours as needed for fever.    . Ascorbic Acid (VITAMIN C PO) Take 1 tablet by mouth daily. Drops daily     . ferrous sulfate (FEROSUL) 325 (65 FE) MG tablet Take 1 tablet (325 mg total) by mouth daily with breakfast. 60 tablet 3  . mesalamine (LIALDA) 1.2 g EC tablet Take 4 tablets (4.8 g total) by mouth daily with breakfast. 120 tablet 11  . omeprazole (PRILOSEC) 20 MG capsule Take 20 mg by mouth daily.    . predniSONE (DELTASONE) 10 MG tablet Take 4 PO QAM x7days, 3 PO QAM x7days, 2 PO QAM x7days, 1 po QAM x 7 (Patient taking differently: Take 10-40 mg by mouth See admin instructions. Take 40 mg every morning for 7 days, then 30 mg every morning for 7 days, then 20 mg every morning for 7 days,  then 10 mg every morning for 7 days, then stop.) 70 tablet 0    Blood pressure 94/67, pulse 84, temperature (!) 97.5 F (36.4 C), temperature source Oral, resp. rate 14, height 5' 9"  (1.753 m), weight 92.5 kg, SpO2 98 %. Physical Exam: Constitutional: NAD; conversant; no deformities, poor dentition Eyes: Moist conjunctiva; no lid lag; anicteric; PERRL Neck: Trachea midline; no thyromegaly Lungs: Normal respiratory effort; no tactile fremitus; lungs CTAB CV: RRR; no palpable thrills; no pitting edema GI: Abd soft, non-tender, non-distended, +BS, previous R subcostal incision from lap converted to open cholecystectomy noted and appears well-healing; no palpable hepatosplenomegaly MSK: extremities symmetrical; no clubbing/cyanosis Psychiatric: Appropriate affect; alert and oriented x3 Lymphatic: No palpable cervical or axillary lymphadenopathy  Results for orders placed or performed during the hospital encounter of 08/13/19 (from the past 48 hour(s))  SARS Coronavirus 2 by RT PCR (hospital order, performed in [place] hospital lab)  Nasopharyngeal Nasopharyngeal Swab     Status: None   Collection Time: 08/13/19 11:33 AM   Specimen: Nasopharyngeal Swab  Result Value Ref Range   SARS Coronavirus 2 NEGATIVE NEGATIVE    Comment: (NOTE) SARS-CoV-2 target nucleic acids are NOT DETECTED. The SARS-CoV-2 RNA is generally detectable in upper and lower respiratory specimens during the acute phase of infection. The lowest concentration of SARS-CoV-2 viral copies this assay can detect is 250 copies / mL. A negative result does not preclude SARS-CoV-2 infection and should not be used as the sole basis for treatment or other patient management decisions.  A negative result may occur with improper specimen collection / handling, submission of specimen other than nasopharyngeal swab, presence of viral mutation(s) within the areas targeted by this assay, and inadequate number of viral copies (<250 copies / mL). A negative result must be combined with clinical observations, patient history, and epidemiological information. Fact Sheet for Patients:   StrictlyIdeas.no Fact Sheet for Healthcare Providers: BankingDealers.co.za This test is not yet approved or cleared  by the [place] FDA and has been authorized for detection and/or diagnosis of SARS-CoV-2 by FDA under an Emergency Use Authorization (EUA).  This  EUA will remain in effect (meaning this test can be used) for the duration of the COVID-19 declaration under Section 564(b)(1) of the Act, 21 U.S.C. section 360bbb-3(b)(1), unless the authorization is terminated or revoked sooner. Performed at [facility], [address] [place], [place] [address]   Lactic acid, plasma     Status: None   Collection Time: 08/13/19 11:34 AM  Result Value Ref Range   Lactic Acid, Venous 1.0 0.5 - 1.9 mmol/L    Comment: Performed at [facility], [address], [place], [place] [address]  CBC     Status: Abnormal   Collection Time: 08/14/19   7:01 AM  Result Value Ref Range   WBC 8.6 4.0 - 10.5 K/uL   RBC 3.59 (L) 4.22 - 5.81 MIL/uL   Hemoglobin 9.7 (L) 13.0 - 17.0 g/dL   HCT 31.2 (L) 39.0 - 52.0 %   MCV 86.9 80.0 - 100.0 fL   MCH 27.0 26.0 - 34.0 pg   MCHC 31.1 30.0 - 36.0 g/dL   RDW 13.1 11.5 - 15.5 %   Platelets 275 150 - 400 K/uL   nRBC 0.0 0.0 - 0.2 %    Comment: Performed at [facility], [address], [place], [place] [address]  Basic metabolic panel     Status: Abnormal   Collection Time: 08/14/19  7:01 AM  Result Value Ref Range   Sodium 132 (L) 135 - 145 mmol/L   Potassium 3.6 3.5 - 5.1 mmol/L   Chloride 98 98 - 111 mmol/L   CO2 23 22 - 32 mmol/L   Glucose, Bld 100 (H) 70 - 99 mg/dL    Comment: Glucose reference range applies only to samples taken after fasting for at least 8 hours.   BUN 13 6 - 20 mg/dL   Creatinine, Ser 0.80 0.61 - 1.24 mg/dL   Calcium 7.7 (L) 8.9 - 10.3 mg/dL   GFR calc non Af Amer >60 >60 mL/min   GFR calc Af Amer >60 >60 mL/min   Anion gap 11 5 - 15    Comment: Performed at [facility], [address] [place], [place] [address]  Lactic acid, plasma     Status: None   Collection Time: 08/14/19  7:01 AM  Result Value Ref Range   Lactic Acid, Venous 1.2 0.5 - 1.9 mmol/L    Comment: Performed at [facility], [address] [place], [place] [address]  Comprehensive metabolic panel     Status: Abnormal   Collection Time: 08/15/19  2:30 AM  Result Value Ref Range   Sodium 134 (L) 135 - 145 mmol/L   Potassium 4.5 3.5 - 5.1 mmol/L   Chloride 100 98 - 111 mmol/L   CO2 23 22 - 32 mmol/L   Glucose, Bld 100 (H) 70 - 99 mg/dL    Comment: Glucose reference range applies only to samples taken after fasting for at least 8 hours.   BUN 11 6 - 20 mg/dL   Creatinine, Ser 0.78 0.61 - 1.24 mg/dL   Calcium 7.9 (L) 8.9 - 10.3 mg/dL   Total Protein 6.0 (L) 6.5 - 8.1 g/dL   Albumin 1.5 (L) 3.5 - 5.0 g/dL   AST 38 15 - 41 U/L   ALT 71 (H) 0 - 44 U/L   Alkaline Phosphatase 49 38 - 126 U/L   Total  Bilirubin 0.4 0.3 - 1.2 mg/dL   GFR calc non Af Amer >60 >60 mL/min   GFR calc Af Amer >60 >60  mL/min   Anion gap 11 5 - 15    Comment: Performed at [facility] [address], [place], [place] [address]  CBC     Status: Abnormal   Collection Time: 08/15/19  2:30 AM  Result Value Ref Range   WBC 4.2 4.0 - 10.5 K/uL   RBC 3.74 (L) 4.22 - 5.81 MIL/uL   Hemoglobin 10.0 (L) 13.0 - 17.0 g/dL   HCT 31.9 (L) 39.0 - 52.0 %   MCV 85.3 80.0 - 100.0 fL   MCH 26.7 26.0 - 34.0 pg   MCHC 31.3 30.0 - 36.0 g/dL   RDW 12.8 11.5 - 15.5 %   Platelets 238 150 - 400 K/uL   nRBC 0.0 0.0 - 0.2 %    Comment: Performed at [facility], [place] [address] [place], [place] [address]   No results found.  Assessment/Plan GERD Ulcerative Colitis - Diagnosed 07/2018, currently takes Pepcid, daily iron, Lialda, oral prednisone taper daily. Sees Dr. Buford Dresser in [place] but was managed by Dr. Oneida Alar before she left the practice. I have consulted GI for medical management of UC, patient is also interested in a second opinion/possibly switching care to GI specialist in [place]. ?septic thrombophlebitis  Left perirectal and retroperitoneal/LUQ fluid collections, suspect secondary to descending colonic perforation - afebrile, VSS, WBC initially 14,900, now 4,200, lactate normal, benign abdominal exam - no acute surgical needs, fluid collections/phlegmons do not appear amenable to percutaneous drainage  - I have placed a GI consult with [facility] for medical management of UC  - continue IV Zosyn - start CLD   FEN: CLD, add nutritional supplements  ID: [id number] VTE: SCD's, Ok for chemical VTE prophylaxis with lovenox or SQH Foley: none  Jill Alexanders, PA-C [facility] 08/15/2019, 10:23 AM

## 2019-08-15 NOTE — Consult Note (Signed)
Consultation  Referring Provider: Obie Dredge, PA-C with [facility]    Primary Care Physician:  Patient, No Pcp Per Primary Gastroenterologist: Dr. Gala Romney       Reason for Consultation:   Ulcerative colitis with perforation and intra-abdominal abscess         HPI:   Brett Bright is a 59 y.o. male with a past medical history of UC (diagnosed 07/2018), PVT (2018), GERD and laparoscopic cholecystectomy who presented to [facility] for management of UC with left intra-abdominal fluid collection.    Today, the patient explains that he was diagnosed with ulcerative colitis back in 2018.  At first managed with Lialda 4.8 g daily, this was then decreased to 2.4 g a year or so ago.  He had really had no problems until April.  Explains that on April 23rd he developed increased nausea and diarrhea with some "discomfort", but no abdominal pain.  He went to see his GI physician who thought possibly he had food poisoning and gave him some antinausea medicine.  Symptoms worsened and he had a CT later that week as below which showed worsened UC.  He was started on a Prednisone taper 40 mg daily for a week and decrease by 10 mg/week.  He felt better for about 5 or 6 days but was still eating very little, and then started to have episodes of vomiting typically in the morning after taking his pills and then would be okay for a few days and then it would come back.  Starting about a week ago he vomited every day, every meal that he tried to eat and was having an increase in loose stools up to 6-10 times per day.  Last vomiting episode was yesterday morning, currently has no nausea and denies abdominal pain.  He has had 3 loose stools this morning.     Patient currently lives at home with his wife who is a Best boy for a [employer] and he works at [employer].  Patient believes his mother may have had some of this going on.  Also reports that his brother had to have part of  his colon removed.    Denies fever, chills, weight loss or symptoms that awaken him from sleep.     ED course: CT abdomen pelvis with contrast 08/13/2019 1. Gas and fluid in the LEFT retroperitoneum and extending into peritoneal spaces via mesenteric pathways in the LEFT abdomen, associated with interloop fluid and gas as well as loculated collections in the mesorectum. Extensive distribution of this process favors septic thrombophlebitis, likely associated with focal colonic perforation. Process best seen on image 76 of series 2 were gas along the IMV at the site of previous thrombus tracks into the LEFT retroperitoneum, extending superiorly and inferiorly from this epicenter are numerous gas and fluid containing collections. 2. Again with focal colonic thickening in the setting of UC colonic neoplasm is considered, as discussed on the previous exam. 3. Secondary enteritis due to extensive inflammation. Enteritis within the jejunum on today's exam 4. Numerous lymph nodes throughout the retroperitoneum and sigmoid mesentery. Many of these are small and some are abnormal with respect to number. The largest in the LEFT retroperitoneum adjacent to the ureter and gonadal vessels measuring 1.3 cm. 5. Large RIGHT renal cyst and areas of mixed attenuation as described on the recent CT evaluation. Follow-up renal protocol CT when the patient is able is suggested to exclude the possibility of new enhancing  lesions, solid renal neoplasm, as discussed on the previous CT evaluation. 6. Mildly heterogeneous appearance of the anterior posterior division superior aspect RIGHT hemi liver likely related to prior portal thrombosis as before.  WBC was 14.9 on 08/13/2019--> 4.2 today  GI history: 03/22/2016 CT abdomen pelvis with contrast: IMPRESSION: Circumferential thickening of the sigmoid colon with adjacent abnormal enlarged lymph nodes. The findings are suspicious for colonic neoplasm with  adjacent lymph node extension.  Nodular soft tissue in the gallbladder wall with gallstones. There is probable associated necrotic lymph node in the porta hepatis. 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 Findings are suspicious for neoplasm question of gallbladder origin.  03/23/2016 EGD by Dr. Oneida Alar -NO SOURCE FOR NAUSEA/VOMITING OR DIARRHEA IDENTIFIED - MILD Gastritis. - Multiple gastric polyps. - MILD Duodenitis. Pathology: Stomach, polyp(s), and gastric biopsies - FUNDIC GLAND POLYPS. - MILD CHRONIC GASTRITIS. - NEGATIVE FOR HELICOBACTER PYLORI. - NO INTESTINAL METAPLASIA, DYSPLASIA, OR MALIGNANCY.  03/23/2016 colonoscopy Dr. Oneida Alar - Thrombosed external hemorrhoids found on digital rectal exam. - Segmental moderate inflammation was found in the sigmoid colon secondary to colitis. Biopsied. - Internal hemorrhoids. - Diverticulosis in the sigmoid colon and in the descending colon ASSOCIATED WITH SEGMENTAL COLITIS. Pathology: Colon, biopsy, sigmoid - CHRONIC ACTIVE COLITIS, SEE COMMENT. - NO DYSPLASIA OR MALIGNANCY.  08/16/2018 colonoscopy Dr. 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 Diverticulosis Pathology: 1. Colon, biopsy, ascending - MILDLY ACTIVE CHRONIC COLITIS, CONSISTENT WITH PATIENT'S CLINICAL HISTORY OF ULCERATIVE COLITIS - NEGATIVE FOR GRANULOMAS OR DYSPLASIA 2. Colon, biopsy, descending/sigmoid - SEVERELY ACTIVE CHRONIC COLITIS WITH FOCAL EROSION, CONSISTENT WITH PATIENT'S CLINICAL HISTORY OF ULCERATIVE COLITIS. SEE NOTE - NEGATIVE FOR GRANULOMAS OR DYSPLASIA 3. Rectum, biopsy - MODERATELY ACTIVE CHRONIC PROCTITIS, CONSISTENT WITH PATIENT'S CLINICAL HISTORY OF ULCERATIVE COLITIS - NEGATIVE FOR GRANULOMAS OR DYSPLASIA  07/19/2019 CT abdomen pelvis with contrast IMPRESSION: 1. Signs of worsening acute on chronic inflammation, sinus tract formation in the wall of the colon and increased  pericolonic stranding in this patient with ulcerative colitis. 2. 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 Inflammation within the colon likely leading to septic thrombophlebitis. 3. Given the marked colonic thickening the possibility of underlying neoplasm remains a consideration and is seen in the setting of increasing size of retroperitoneal lymph nodes. Follow-up colonoscopy is suggested. 4. Lobular hepatic contours raise the question of liver disease/fibrosis or cirrhosis. Laboratory and clinical correlation may be helpful. Scarring in the liver from prior RIGHT portal thrombosis. 5. Multiple RIGHT renal lesions of variable density most reflecting hemorrhagic cysts. Some new however compared to exam dating back to 2014. Other small lesions higher density are indeterminate and potentially enhancing based on this evaluation. Consider follow-up renal protocol CT or MRI on a nonemergent basis, within 3-6 months.  Past Medical History:  Diagnosis Date  . Ulcerative colitis with rectal bleeding ([place]) 07/2018    Past Surgical History:  Procedure Laterality Date  . APPENDECTOMY    . BIOPSY  08/16/2018   Procedure: BIOPSY;  Surgeon: Daneil Dolin, MD;  Location: [facility];  Service: Endoscopy;;  . CHOLECYSTECTOMY    . COLONOSCOPY N/A 03/23/2016   Dr. Oneida Alar: Thrombosed external hemorrhoid, internal hemorrhoids, diverticulosis, segmental moderate inflammation sigmoid colon secondary due to colitis with benign biopsies.  Next colonoscopy 5 years due to poor prep right colon.  . COLONOSCOPY N/A 08/16/2018   Dr. Gala Romney: pan-ulcerative colitis, diverticulosis. Bx c/w UC  . ESOPHAGOGASTRODUODENOSCOPY N/A 03/23/2016   Dr. Oneida Alar: Gastritis,  no H. pylori, multiple small sessile polyps biopsy consistent with fundic gland, mild duodenitis.  Marland Kitchen ESOPHAGOGASTRODUODENOSCOPY N/A 08/16/2018   Procedure: ESOPHAGOGASTRODUODENOSCOPY (EGD);  Surgeon: Daneil Dolin, MD;  Location: [facility];  Service: Endoscopy;  Laterality: N/A;  . VASECTOMY      Family History  Problem Relation Age of Onset  . Stroke Mother   . Cancer Maternal Grandfather        Unknown type of cancer  . Heart attack Paternal Grandmother   . Cancer Maternal Aunt        Possibly lung cancer  . Cancer Paternal Uncle        Unknown type of cancer  . Cancer Paternal Uncle        Unknown type of cancer  . Colon cancer Neg Hx     Social History   Tobacco Use  . Smoking status: Never Smoker  . Smokeless tobacco: Never Used  Substance Use Topics  . Alcohol use: No  . Drug use: No    Prior to Admission medications   Medication Sig Start Date End Date Taking? Authorizing Provider  acetaminophen (TYLENOL) 325 MG tablet Take 325 mg by mouth every 6 (six) hours as needed for fever.   Yes [provider]  Ascorbic Acid (VITAMIN C PO) Take 1 tablet by mouth daily. Drops daily    Yes [provider]  ferrous sulfate (FEROSUL) 325 (65 FE) MG tablet Take 1 tablet (325 mg total) by mouth daily with breakfast. 07/20/19  Yes Johnson, Clanford L, MD  mesalamine (LIALDA) 1.2 g EC tablet Take 4 tablets (4.8 g total) by mouth daily with breakfast. 07/20/19  Yes Johnson, Clanford L, MD  omeprazole (PRILOSEC) 20 MG capsule Take 20 mg by mouth daily.   Yes [provider]  predniSONE (DELTASONE) 10 MG tablet Take 4 PO QAM x7days, 3 PO QAM x7days, 2 PO QAM x7days, 1 po QAM x 7 Patient taking differently: Take 10-40 mg by mouth See admin instructions. Take 40 mg every morning for 7 days, then 30 mg every morning for 7 days, then 20 mg every morning for 7 days,  then 10 mg every morning for 7 days, then stop. 07/21/19  Yes Murlean Iba, MD    Current Facility-Administered Medications  Medication Dose Route Frequency Provider Last Rate Last Admin  . 0.9 % NaCl with KCl 20 mEq/ L  infusion   Intravenous Continuous Tat, Shanon Brow, MD 75 mL/hr at 08/15/19 0205 New Bag at 08/15/19 0205  .  acetaminophen (TYLENOL) tablet 650 mg  650 mg Oral Q6H PRN Tat, David, MD       Or  . acetaminophen (TYLENOL) suppository 650 mg  650 mg Rectal Q6H PRN Tat, Shanon Brow, MD      . methylPREDNISolone sodium succinate (SOLU-MEDROL) 40 mg/mL injection 20 mg  20 mg Intravenous Daily Tat, David, MD   20 mg at 08/15/19 0959  . morphine 4 MG/ML injection 4 mg  4 mg Intravenous Q4H PRN Maudie Flakes, MD      . ondansetron [facility]) tablet 4 mg  4 mg Oral Q6H PRN Tat, Shanon Brow, MD       Or  . ondansetron (ZOFRAN) injection 4 mg  4 mg Intravenous Q6H PRN Tat, Shanon Brow, MD      . piperacillin-tazobactam (ZOSYN) IVPB 3.375 g  3.375 g Intravenous Franco Collet, MD 100 mL/hr at 08/15/19 0535 3.375 g at 08/15/19 0535    Allergies as of 08/13/2019  . (  No Known Allergies)     Review of Systems:    Constitutional: No weight loss, fever or chills Skin: No rash  Cardiovascular: No chest pain Respiratory: No SOB  Gastrointestinal: See HPI and otherwise negative Genitourinary: No dysuria  Neurological: No headache, dizziness or syncope Musculoskeletal: No new muscle or joint pain Hematologic: No bleeding or bruising Psychiatric: No history of depression or anxiety    Physical Exam:  Vital signs in last 24 hours: Temp:  [97.5 F (36.4 C)-98.6 F (37 C)] 97.5 F (36.4 C) (05/26 0538) Pulse Rate:  [76-103] 84 (05/26 0538) Resp:  [14-19] 14 (05/26 0538) BP: (89-110)/(54-82) 94/67 (05/26 0538) SpO2:  [94 %-98 %] 98 % (05/26 0538) Weight:  [92.5 kg] 92.5 kg (05/25 1959) Last BM Date: 08/15/19 General:   Pleasant Caucasian male appears to be in NAD, Well developed, Well nourished, alert and cooperative Head:  Normocephalic and atraumatic. Eyes:   PEERL, EOMI. No icterus. Conjunctiva pink. Ears:  Normal auditory acuity. Neck:  Supple Throat: Oral cavity and pharynx without inflammation, swelling or lesion.  Lungs: Respirations even and unlabored. Lungs clear to auscultation bilaterally.   No wheezes, crackles,  or rhonchi.  Heart: Normal S1, S2. No MRG. Regular rate and rhythm. No peripheral edema, cyanosis or pallor.  Abdomen: Tense, nondistended, nontender. No rebound or guarding. Normal bowel sounds. No appreciable masses or hepatomegaly. Rectal:  Not performed.  Msk:  Symmetrical without gross deformities. Peripheral pulses intact.  Extremities:  Without edema, no deformity or joint abnormality. Normal ROM, normal sensation. Neurologic:  Alert and  oriented x4;  grossly normal neurologically.  Skin:   Dry and intact without significant lesions or rashes. Psychiatric: Demonstrates good judgement and reason without abnormal affect or behaviors.  LAB RESULTS: Recent Labs    08/13/19 0609 08/14/19 0701 08/15/19 0230  WBC 14.9* 8.6 4.2  HGB 11.4* 9.7* 10.0*  HCT 35.5* 31.2* 31.9*  PLT 312 275 238   BMET Recent Labs    08/13/19 0609 08/14/19 0701 08/15/19 0230  NA 127* 132* 134*  K 3.2* 3.6 4.5  CL 93* 98 100  CO2 23 23 23   GLUCOSE 121* 100* 100*  BUN 13 13 11   CREATININE 0.78 0.80 0.78  CALCIUM 8.1* 7.7* 7.9*   LFT Recent Labs    08/15/19 0230  PROT 6.0*  ALBUMIN 1.5*  AST 38  ALT 71*  ALKPHOS 49  BILITOT 0.4    Impression / Plan:   Impression: 1.  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  Agree with IV Zosyn 2.  Appreciate surgical recommendations 3.  We will consult IR to see if any of the fluid collections are amenable to drainage. 4.  Agree with clear liquid diet for now. 5.  Continue other supportive measures 6. Continue Solumedrol 30m IV qd for now-may need to increase 7.  Please await any further recommendations from Dr. MRush Landmark Thank you for your kind consultation, we will continue to follow.  JLavone NianLRedding [facility] Center 08/15/2019, 10:59 AM

## 2019-08-16 ENCOUNTER — Inpatient Hospital Stay: Payer: Self-pay

## 2019-08-16 ENCOUNTER — Inpatient Hospital Stay (HOSPITAL_COMMUNITY): Payer: 59

## 2019-08-16 DIAGNOSIS — R935 Abnormal findings on diagnostic imaging of other abdominal regions, including retroperitoneum: Secondary | ICD-10-CM

## 2019-08-16 LAB — RENAL FUNCTION PANEL
Albumin: 1.5 g/dL — ABNORMAL LOW (ref 3.5–5.0)
Anion gap: 7 (ref 5–15)
BUN: 8 mg/dL (ref 6–20)
CO2: 24 mmol/L (ref 22–32)
Calcium: 7.7 mg/dL — ABNORMAL LOW (ref 8.9–10.3)
Chloride: 104 mmol/L (ref 98–111)
Creatinine, Ser: 0.72 mg/dL (ref 0.61–1.24)
GFR calc Af Amer: >60 mL/min (ref >60)
GFR calc non Af Amer: >60 mL/min (ref >60)
Glucose, Bld: 113 mg/dL — ABNORMAL HIGH (ref 70–99)
Phosphorus: 3.2 mg/dL (ref 2.5–4.6)
Potassium: 3.6 mmol/L (ref 3.5–5.1)
Sodium: 135 mmol/L (ref 135–145)

## 2019-08-16 LAB — CBC WITH DIFFERENTIAL/PLATELET
Abs Immature Granulocytes: 0.03 10*3/uL (ref 0.00–0.07)
Basophils Absolute: 0 10*3/uL (ref 0.0–0.1)
Basophils Relative: 0 %
Eosinophils Absolute: 0.1 10*3/uL (ref 0.0–0.5)
Eosinophils Relative: 1 %
HCT: 31.4 % — ABNORMAL LOW (ref 39.0–52.0)
Hemoglobin: 9.8 g/dL — ABNORMAL LOW (ref 13.0–17.0)
Immature Granulocytes: 1 %
Lymphocytes Relative: 17 %
Lymphs Abs: 0.9 10*3/uL (ref 0.7–4.0)
MCH: 26.6 pg (ref 26.0–34.0)
MCHC: 31.2 g/dL (ref 30.0–36.0)
MCV: 85.3 fL (ref 80.0–100.0)
Monocytes Absolute: 0.5 10*3/uL (ref 0.1–1.0)
Monocytes Relative: 10 %
Neutro Abs: 3.7 10*3/uL (ref 1.7–7.7)
Neutrophils Relative %: 71 %
Platelets: 269 10*3/uL (ref 150–400)
RBC: 3.68 MIL/uL — ABNORMAL LOW (ref 4.22–5.81)
RDW: 12.6 % (ref 11.5–15.5)
WBC: 5.1 10*3/uL (ref 4.0–10.5)
nRBC: 0 % (ref 0.0–0.2)

## 2019-08-16 LAB — VITAMIN B12: Vitamin B-12: 489 pg/mL (ref 180–914)

## 2019-08-16 LAB — MAGNESIUM: Magnesium: 2 mg/dL (ref 1.7–2.4)

## 2019-08-16 LAB — IRON AND TIBC
Iron: 61 ug/dL (ref 45–182)
Saturation Ratios: 49 % — ABNORMAL HIGH (ref 17.9–39.5)
TIBC: 125 ug/dL — ABNORMAL LOW (ref 250–450)
UIBC: 64 ug/dL

## 2019-08-16 LAB — PREALBUMIN: Prealbumin: 7.3 mg/dL — ABNORMAL LOW (ref 18–38)

## 2019-08-16 LAB — PROTIME-INR
INR: 1.2 (ref 0.8–1.2)
Prothrombin Time: 14.7 seconds (ref 11.4–15.2)

## 2019-08-16 LAB — FOLATE: Folate: 5.1 ng/mL — ABNORMAL LOW (ref >5.9)

## 2019-08-16 LAB — FERRITIN: Ferritin: 355 ng/mL — ABNORMAL HIGH (ref 24–336)

## 2019-08-16 MED ORDER — SODIUM CHLORIDE 0.9% FLUSH
10.0000 mL | INTRAVENOUS | Status: DC | PRN
  Administered 2019-08-21: 10 mL

## 2019-08-16 MED ORDER — INSULIN ASPART 100 UNIT/ML ~~LOC~~ SOLN: 0.0000 [IU] | SUBCUTANEOUS | Status: DC

## 2019-08-16 MED ORDER — TRAVASOL 10 % IV SOLN
INTRAVENOUS | Status: AC
  Filled 2019-08-16: qty 537.6

## 2019-08-16 MED ORDER — FOLIC ACID 5 MG/ML IJ SOLN
1.0000 mg | Freq: Every day | INTRAMUSCULAR | Status: DC
  Filled 2019-08-16: qty 0.2

## 2019-08-16 MED ORDER — MIDAZOLAM HCL 2 MG/2ML IJ SOLN
INTRAMUSCULAR | Status: AC | PRN
  Administered 2019-08-16: 1 mg via INTRAVENOUS

## 2019-08-16 MED ORDER — FENTANYL CITRATE (PF) 100 MCG/2ML IJ SOLN
INTRAMUSCULAR | Status: AC
  Filled 2019-08-16: qty 2

## 2019-08-16 MED ORDER — INSULIN ASPART 100 UNIT/ML ~~LOC~~ SOLN
0.0000 [IU] | Freq: Four times a day (QID) | SUBCUTANEOUS | Status: DC
  Administered 2019-08-17: 2 [IU] via SUBCUTANEOUS
  Administered 2019-08-17: 3 [IU] via SUBCUTANEOUS
  Administered 2019-08-17 – 2019-08-21 (×11): 2 [IU] via SUBCUTANEOUS

## 2019-08-16 MED ORDER — MIDAZOLAM HCL 2 MG/2ML IJ SOLN
INTRAMUSCULAR | Status: AC
  Filled 2019-08-16: qty 2

## 2019-08-16 MED ORDER — SODIUM CHLORIDE 0.9 % IV SOLN: INTRAVENOUS | Status: AC

## 2019-08-16 MED ORDER — POTASSIUM CHLORIDE CRYS ER 20 MEQ PO TBCR
40.0000 meq | EXTENDED_RELEASE_TABLET | Freq: Once | ORAL | Status: AC
  Administered 2019-08-16: 40 meq via ORAL
  Filled 2019-08-16: qty 2

## 2019-08-16 MED ORDER — SODIUM CHLORIDE 0.9% FLUSH
5.0000 mL | Freq: Three times a day (TID) | INTRAVENOUS | Status: DC
  Administered 2019-08-16 – 2019-08-24 (×21): 5 mL

## 2019-08-16 MED ORDER — FENTANYL CITRATE (PF) 100 MCG/2ML IJ SOLN
INTRAMUSCULAR | Status: AC | PRN
  Administered 2019-08-16: 50 ug via INTRAVENOUS

## 2019-08-16 MED ORDER — SODIUM CHLORIDE 0.9% FLUSH
10.0000 mL | Freq: Two times a day (BID) | INTRAVENOUS | Status: DC
  Administered 2019-08-17 – 2019-08-23 (×4): 10 mL

## 2019-08-16 MED ORDER — CHLORHEXIDINE GLUCONATE CLOTH 2 % EX PADS
6.0000 | MEDICATED_PAD | Freq: Every day | CUTANEOUS | Status: DC
  Administered 2019-08-16 – 2019-08-23 (×8): 6 via TOPICAL

## 2019-08-16 NOTE — Plan of Care (Signed)
  Problem: Education: Goal: Knowledge of General Education information will improve Description Including pain rating scale, medication(s)/side effects and non-pharmacologic comfort measures Outcome: Progressing   

## 2019-08-16 NOTE — Progress Notes (Addendum)
PROGRESS NOTE    Brett Bright  GDJ:[id number] DOB: 1960-07-24 DOA: 08/13/2019 PCP: Patient, No Pcp Per   Chief Complaint  Patient presents with  . Emesis    Brief Narrative:  HPI per Dr. Abigail Miyamoto is a 59 y.o. male with medical history of Ulcerative colitis, upper GI bleed, portal vein thrombus, GERD presenting with over 1 week history of nausea, vomiting, and diarrhea.  Patient was recently admitted to the hospital from 07/19/2019 to 07/20/2019 with ulcerative colitis flare.  He was discharged home with a prednisone taper.  He ultimately followed up with his gastroenterologist on 08/01/2019.  He has Lialda was increased to 4.8 g daily, and he was instructed to continue on his prednisone taper.  The patient states that he has tapered himself down to 10 mg of prednisone.  He began his 10 mg dose on 08/10/2019.  However, the patient has continued to have worsening nausea, vomiting, and diarrhea in the past week.  He denies any hematemesis, hematochezia, melena, fevers, chills.  He states that he has never had any abdominal pain even with his initial diagnosis of ulcerative colitis.  He states that he does not have any abdominal pain at this point.  He denies any fevers, chills, headache, chest pain, shortness breath, coughing, hemoptysis, hematemesis, dysuria, hematuria.  He denies any new medications.  He does not drink any alcohol or take any NSAIDs. In the emergency department, the patient has been afebrile and hemodynamically stable with oxygen saturation 96-100% on room air.  WBC was initially 14.9 with hemoglobin 11.4, and platelets 275,000.  Initial sodium was 127 with serum creatinine 0.79.  CT of the abdomen and pelvis showed Gas and fluid in the LEFT retroperitoneum and extending into peritoneal spaces via mesenteric pathways in the LEFT abdomen, associated with interloop fluid and gas as well as loculated collections in the mesorectum.  Because of the extensive distribution,  septic thrombophlebitis was favored associated with local colonic perforation.  There was gas noted along the IMV at the site of the previous thrombus into the left retroperitoneum. Initial contact was made with [facility] whom had accepted the patient in transfer.  However the patient continued to wait in the emergency department for over 24 hours.  Subsequently, general surgery, Dr. Aviva Signs was contacted.  He recommended transfer to a higher level of care.  He ultimately spoke with general surgery, Dr. Jaymes Graff, at [facility] and the plan is to transfer the patient to [facility] for further surgical evaluation.  The patient was started on intravenous Zosyn and IV fluids.  Assessment & Plan:   Principal Problem:   Colon perforation ([place]) Active Problems:   Hyponatremia   Hypokalemia   Nausea vomiting and diarrhea   Ulcerative colitis (HCC)   Iron deficiency anemia due to chronic blood loss   Dehydration  1 concern for peritonitis secondary to perforated colon As noted on CT abdomen and pelvis.  Patient currently afebrile.  Normal white count.  Patient denies any abdominal pain.  Nausea vomiting diarrhea improving.  Patient initially seen by Dr. Arnoldo Morale, general surgery at [facility] who discussed case with general surgery at [facility], [place] and patient transferred to [facility] for further surgical evaluation.  Patient seen in consultation by general surgery this morning who feel patient is not toxic appearing and recommending management patient's ulcerative colitis.  Continue IV Zosyn, IV Solu-Medrol.  Patient on a clear liquid diet.  Per general surgery no surgical  needs at this time.  Fluid collection/phlegmon's noted on CT abdomen and pelvis per general surgery do not appear to be amenable to percutaneous drainage.  GI consulted who are recommending IR evaluation for CT-guided pelvic/abdominal fluid aspiration with possible drain placement to be done today.  GI  recommending TPN to be initiated.  General surgery and GI following and appreciate input and recommendations.  2.  Ulcerative colitis flare Patient states some improvement with nausea and emesis.  Abdominal pain improved.  Loose stools improved.  CRP elevated at 10.1.  Sed rate elevated at 70.  Currently tolerating clear liquids.  Continue IV Zosyn, IV Solu-Medrol 20 mg daily.  Patient seen by GI who are recommending attempt by IR for drainage/aspiration of fluid collections if possible.  GI recommending TPN be initiated.  GI also recommending steroids be discontinued however will defer to them.  GI following and I appreciate the input and recommendations.  3.  Hyponatremia Likely secondary to hypovolemic hyponatremia.  Improved with hydration.  Follow.    4.  Hypokalemia Repleted.  Potassium at 3.6.  Follow.    5.  Iron deficiency anemia anemia, unknown/folate deficiency. Likely secondary to problem #2.  Anemia panel consistent with anemia of chronic disease.  Folic acid level of 5.1.  Place on folate supplementation.  6.  Severe protein calorie malnutrition TPN to be initiated per pharmacy today.    DVT prophylaxis: SCDs Code Status full Family Communication: Updated patient.  No family at bedside. Disposition:   Status is: Inpatient    Dispo: The patient is from: Home              Anticipated d/c is to: Home              Anticipated d/c date is: To be determined.              Patient currently on IV antibiotics, IV steroids, being followed by general surgery and GI due to ulcerative colitis flare and concern for peritonitis.  IR to assess for aspiration versus drain placement.        Consultants:   General surgery: Dr. Olena Leatherwood [facility] 08/13/2019  General surgery: Dr. Nyoka Cowden [facility] 08/15/2019  Gastroenterology: Dr. Rush Landmark 08/15/2019  Interventional radiology: Dr. Earleen Newport  Procedures:   CT abdomen and pelvis 08/13/2019    Antimicrobials:     IV Zosyn 08/13/2019   Subjective: Patient laying in bed awaiting to go down to IR for procedure.  Denies any chest pain.  No shortness of breath.  No nausea or emesis.  States loose stools have improved.  Abdominal pain improved.  States he is feeling better today.    Objective: Vitals:   08/15/19 0137 08/15/19 0538 08/15/19 2101 08/16/19 0357  BP: 103/65 94/67 104/74 108/73  Pulse: 78 84 66 74  Resp: 19 14 17 17   Temp: 97.8 F (36.6 C) (!) 97.5 F (36.4 C) 98.5 F (36.9 C) (!) 97.5 F (36.4 C)  TempSrc: Oral Oral    SpO2: 98% 98% 97% 98%  Weight:      Height:        Intake/Output Summary (Last 24 hours) at 08/16/2019 1400 Last data filed at 08/16/2019 0625 Gross per 24 hour  Intake 1965.44 ml  Output --  Net 1965.44 ml   Filed Weights   08/13/19 0549 08/14/19 1959  Weight: 92.5 kg 92.5 kg    Examination:  General exam: NAD Respiratory system: CTAB.  No wheezes, no crackles, no rhonchi.  Normal respiratory effort.   Cardiovascular system: Regular rate rhythm no murmurs rubs or gallops.  No JVD.  No lower extremity edema.  Gastrointestinal system: Abdomen is soft, nontender, nondistended, positive bowel sounds.  No rebound.  No guarding.   Central nervous system: Alert and oriented. No focal neurological deficits. Extremities: Symmetric 5 x 5 power. Skin: No rashes, lesions or ulcers Psychiatry: Judgement and insight appear normal. Mood & affect appropriate.     Data Reviewed: I have personally reviewed following labs and imaging studies  CBC: Recent Labs  Lab 08/13/19 0609 08/14/19 0701 08/15/19 0230 08/16/19 0203  WBC 14.9* 8.6 4.2 5.1  NEUTROABS 13.0*  --   --  3.7  HGB 11.4* 9.7* 10.0* 9.8*  HCT 35.5* 31.2* 31.9* 31.4*  MCV 83.9 86.9 85.3 85.3  PLT 312 275 238 505    Basic Metabolic Panel: Recent Labs  Lab 08/13/19 0609 08/14/19 0701 08/15/19 0230 08/16/19 0203  NA 127* 132* 134* 135  K 3.2* 3.6 4.5 3.6  CL 93* 98 100 104  CO2 23 23 23 24    GLUCOSE 121* 100* 100* 113*  BUN 13 13 11 8   CREATININE 0.78 0.80 0.78 0.72  CALCIUM 8.1* 7.7* 7.9* 7.7*  MG 2.1  --   --  2.0  PHOS  --   --   --  3.2    GFR: Estimated Creatinine Clearance: 113 mL/min (by C-G formula based on SCr of 0.72 mg/dL).  Liver Function Tests: Recent Labs  Lab 08/13/19 0609 08/15/19 0230 08/16/19 0203  AST 107* 38  --   ALT 143* 71*  --   ALKPHOS 77 49  --   BILITOT 0.6 0.4  --   PROT 7.2 6.0*  --   ALBUMIN 2.3* 1.5* 1.5*    CBG: No results for input(s): GLUCAP in the last 168 hours.   Recent Results (from the past 240 hour(s))  Culture, blood (routine x 2)     Status: None (Preliminary result)   Collection Time: 08/13/19 10:03 AM   Specimen: BLOOD RIGHT HAND  Result Value Ref Range Status   Specimen Description BLOOD RIGHT HAND  Final   Special Requests   Final    BOTTLES DRAWN AEROBIC AND ANAEROBIC Blood Culture adequate volume   Culture   Final    NO GROWTH 3 DAYS Performed at [facility], [address], [place], [place] [address]    Report Status PENDING  Incomplete  Culture, blood (routine x 2)     Status: None (Preliminary result)   Collection Time: 08/13/19 10:08 AM   Specimen: BLOOD LEFT HAND  Result Value Ref Range Status   Specimen Description BLOOD LEFT HAND  Final   Special Requests   Final    BOTTLES DRAWN AEROBIC AND ANAEROBIC Blood Culture adequate volume   Culture   Final    NO GROWTH 3 DAYS Performed at [facility], [address], [place], [place] [address]    Report Status PENDING  Incomplete  SARS Coronavirus 2 by RT PCR (hospital order, performed in [facility] hospital lab) Nasopharyngeal Nasopharyngeal Swab     Status: None   Collection Time: 08/13/19 11:33 AM   Specimen: Nasopharyngeal Swab  Result Value Ref Range Status   SARS Coronavirus 2 NEGATIVE NEGATIVE Final    Comment: (NOTE) SARS-CoV-2 target nucleic acids are NOT DETECTED. The SARS-CoV-2 RNA is generally detectable in upper and  lower respiratory specimens during the acute phase of infection. The lowest concentration of SARS-CoV-2 viral  copies this assay can detect is 250 copies / mL. A negative result does not preclude SARS-CoV-2 infection and should not be used as the sole basis for treatment or other patient management decisions.  A negative result may occur with improper specimen collection / handling, submission of specimen other than nasopharyngeal swab, presence of viral mutation(s) within the areas targeted by this assay, and inadequate number of viral copies (<250 copies / mL). A negative result must be combined with clinical observations, patient history, and epidemiological information. Fact Sheet for Patients:   StrictlyIdeas.no Fact Sheet for Healthcare Providers: BankingDealers.co.za This test is not yet approved or cleared  by the [place] FDA and has been authorized for detection and/or diagnosis of SARS-CoV-2 by FDA under an Emergency Use Authorization (EUA).  This EUA will remain in effect (meaning this test can be used) for the duration of the COVID-19 declaration under Section 564(b)(1) of the Act, 21 U.S.C. section 360bbb-3(b)(1), unless the authorization is terminated or revoked sooner. Performed at [facility], [address] [place],  [address]          Radiology Studies: [place] EKG SITE RITE  Result Date: 08/16/2019 If [facility] image not attached, placement could not be confirmed due to current cardiac rhythm.       Scheduled Meds: . fentaNYL      . midazolam      . feeding supplement  1 Container Oral TID BM  . insulin aspart  0-15 Units Subcutaneous Q6H   Continuous Infusions: . sodium chloride 100 mL/hr at 08/16/19 0930  . sodium chloride    . piperacillin-tazobactam 3.375 g (08/16/19 0622)  . TPN ADULT (ION)       LOS: 2 days    Time spent: 35 minutes    Irine Seal, MD [facility]   To contact the attending provider between 7A-7P or the covering provider during after hours 7P-7A, please log into the web site [email or web address] and access using universal [place] password for that web site. If you do not have the password, please call the hospital operator.  08/16/2019, 2:00 PM

## 2019-08-16 NOTE — Progress Notes (Signed)
Nutrition Follow-up  DOCUMENTATION CODES:   Not applicable  INTERVENTION:   -Continue Boost Breeze po TID, each supplement provides 250 kcal and 9 grams of protein -TPN management per pharmacy -RD will follow for diet advancement and adjust supplement regimen as appropriate  NUTRITION DIAGNOSIS:   Increased nutrient needs related to acute illness(peritonitis and intra-abdominal abscess) as evidenced by estimated needs.  Ongoing  GOAL:   Patient will meet greater than or equal to 90% of their needs  Progressing   MONITOR:   PO intake, Supplement acceptance, Diet advancement, Labs, Weight trends, Skin, I & O's  REASON FOR ASSESSMENT:   Malnutrition Screening Tool    ASSESSMENT:   Brett Bright is a 59 y.o. male with medical history ofUlcerative colitis, upper GI bleed, portal vein thrombus, GERD presenting with over 1 week history of nausea, vomiting, and diarrhea.  5/27- s/p CT guided drainage of Left Abd abscess, 63F drain  Reviewed I/O's: 2.6 L x 24 hours and +8 L since admission  Spoke with pt at bedside, who was pleasant and in good spirits today. He reports his appetite has been inconsistent for about the past month (since 07/12/19). Pt shares that there would be days that he would eat well, but other he would eat very little secondary to abdominal pain and early satiety. Over the past 1-2 weeks, pt shares that he has been consuming mostly "safe but bland foods" such as grilled chicken or chicken pot pie. He eliminated steaks and highly seasoned foods, as he felt that this exacerbated his symptoms.   Pt was taking clear liquids yesterday without difficulty. Pt currently NPO, due to drain placement. Case discussed with GI PA, who confirms plan to place PICC and start TPN today (suspect pt will require extended period of clear liquids only). RD discussed with pt how he will receive the majority of his nutrition (TPN).   Per phrmacy notes, plan to start TPN at 40  ml/hr at 1800, which will provides 950 kcals and 54 grams protein, meeting 48% of estimated kcal needs and 49% of estimated protein needs.  Albumin has a half-life of 21 days and is strongly affected by stress response and inflammatory process, therefore, do not expect to see an improvement in this lab value during acute hospitalization. When a patient presents with low albumin, it is likely skewed due to the acute inflammatory response. Note that low albumin is no longer used to diagnose malnutrition; [place] uses the new malnutrition guidelines published by the American Society for Parenteral and Enteral Nutrition (A.S.P.E.N.) and the Academy of Nutrition and Dietetics (AND).    Labs reviewed.   NUTRITION - FOCUSED PHYSICAL EXAM:    Most Recent Value  Orbital Region  No depletion  Upper Arm Region  No depletion  Thoracic and Lumbar Region  No depletion  Buccal Region  No depletion  Temple Region  No depletion  Clavicle Bone Region  No depletion  Clavicle and Acromion Bone Region  No depletion  Scapular Bone Region  No depletion  Dorsal Hand  No depletion  Patellar Region  No depletion  Anterior Thigh Region  No depletion  Posterior Calf Region  No depletion  Edema (RD Assessment)  None  Hair  Reviewed  Eyes  Reviewed  Mouth  Reviewed  Skin  Reviewed  Nails  Reviewed       Diet Order:   Diet Order            Diet NPO time specified Except  for: Sips with Meds  Diet effective midnight              EDUCATION NEEDS:   Not appropriate for education at this time  Skin:  Skin Assessment: Reviewed RN Assessment  Last BM:  08/15/19  Height:   Ht Readings from Last 1 Encounters:  08/14/19 5' 9"  (1.753 m)    Weight:   Wt Readings from Last 1 Encounters:  08/14/19 92.5 kg    Ideal Body Weight:  72.7 kg  BMI:  Body mass index is 30.11 kg/m.  Estimated Nutritional Needs:   Kcal:  2000-2200  Protein:  110-125 grams  Fluid:  > 2 L    [name], RD, LDN,  [place] Registered Dietitian II Certified Diabetes Care and Education Specialist Please refer to [facility] for RD and/or RD on-call/weekend/after hours pager

## 2019-08-16 NOTE — Progress Notes (Signed)
Daily Rounding Note  08/16/2019, 11:00 AM  LOS: 2 days   SUBJECTIVE:   Chief complaint: intra abdominal, RP fluid collestions/absceses.    No abd pain, no nausea.  Soft brown, small stool this AM.  Feels well  OBJECTIVE:         Vital signs in last 24 hours:    Temp:  [97.5 F (36.4 C)-98.5 F (36.9 C)] 97.5 F (36.4 C) (05/27 0357) Pulse Rate:  [66-74] 74 (05/27 0357) Resp:  [17] 17 (05/27 0357) BP: (104-108)/(73-74) 108/73 (05/27 0357) SpO2:  [97 %-98 %] 98 % (05/27 0357) Last BM Date: 08/15/19 Filed Weights   08/13/19 0549 08/14/19 1959  Weight: 92.5 kg 92.5 kg   General: looks well, comfortable.  No toxic   Heart: RRR Chest: clear bil.   Abdomen: soft, NT, ND.  Active BS  Extremities: no CCE Neuro/Psych:  Alert, calm, engaged.  No gross weakness, tremors or deficits.    Intake/Output from previous day: 05/26 0701 - 05/27 0700 In: 2565.4 [P.O.:780; I.V.:1585.4; IV Piggyback:200] Out: -   Intake/Output this shift: No intake/output data recorded.  Lab Results: Recent Labs    08/14/19 0701 08/15/19 0230 08/16/19 0203  WBC 8.6 4.2 5.1  HGB 9.7* 10.0* 9.8*  HCT 31.2* 31.9* 31.4*  PLT 275 238 269   BMET Recent Labs    08/14/19 0701 08/15/19 0230 08/16/19 0203  NA 132* 134* 135  K 3.6 4.5 3.6  CL 98 100 104  CO2 23 23 24   GLUCOSE 100* 100* 113*  BUN 13 11 8   CREATININE 0.80 0.78 0.72  CALCIUM 7.7* 7.9* 7.7*   LFT Recent Labs    08/15/19 0230 08/16/19 0203  PROT 6.0*  --   ALBUMIN 1.5* 1.5*  AST 38  --   ALT 71*  --   ALKPHOS 49  --   BILITOT 0.4  --    PT/INR Recent Labs    08/16/19 0203  LABPROT 14.7  INR 1.2   Hepatitis Panel No results for input(s): HEPBSAG, HCVAB, HEPAIGM, HEPBIGM in the last 72 hours.  Studies/Results: [place] EKG SITE RITE  Result Date: 08/16/2019 If Site Rite image not attached, placement could not be confirmed due to current cardiac  rhythm.  Scheduled Meds: . feeding supplement  1 Container Oral TID BM  . insulin aspart  0-15 Units Subcutaneous Q6H   Continuous Infusions: . sodium chloride 100 mL/hr at 08/16/19 0930  . sodium chloride    . piperacillin-tazobactam 3.375 g (08/16/19 0622)  . TPN ADULT (ION)     PRN Meds:.acetaminophen **OR** acetaminophen, morphine, ondansetron **OR** ondansetron (ZOFRAN) IV  ASSESMENT:   *   Ulcerative colitis (though current situation raises ? Crohn's) with complications of perforation, intra-abdominal and retroperitoneal abscesses/fluid collections.  ? septic thrombophlebitis.? Diverticular perforation rather than perforation due to IBD, the former is more common, the latter is very rare. Day 4 Zosyn.  Steroids discontinued as of 5/26. IR to assess for aspiration, ?drain (drains) placement today.    *   Protein malnutrition.  TPN starts today.  NPO xcpt for meds  *   IMV thrombosis, prior portal thrombosis.   Not on AC.    *   Liver lobularity, ? Fibrosis/cirrhosis.  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   *    IDA, low ferritin, lower normal Iron, low TIBC, elevated iron sats.   Hgb stable.      PLAN   *  IR drainage, aspiration today.    *   Feraheme  Infusion?     Brett Bright  08/16/2019, 11:00 AM Phone [phone]

## 2019-08-16 NOTE — Progress Notes (Signed)
[facility] Progress Note     Subjective: CC-  Comfortable this morning. Denies abdominal pain, nausea, vomiting. Tolerated clear liquids yesterday without increased pain. 2 small loose BMs over night.  WBC 5.1, afebrile Going to IR for aspiration vs drain placement today.  Objective: Vital signs in last 24 hours: Temp:  [97.5 F (36.4 C)-98.5 F (36.9 C)] 97.5 F (36.4 C) (05/27 0357) Pulse Rate:  [66-74] 74 (05/27 0357) Resp:  [17] 17 (05/27 0357) BP: (104-108)/(73-74) 108/73 (05/27 0357) SpO2:  [97 %-98 %] 98 % (05/27 0357) Last BM Date: 08/15/19  Intake/Output from previous day: 05/26 0701 - 05/27 0700 In: 2565.4 [P.O.:780; I.V.:1585.4; IV Piggyback:200] Out: -  Intake/Output this shift: No intake/output data recorded.  PE: Gen:  Alert, NAD, pleasant Card:  RRR Pulm:  CTAB, no W/R/R, rate and effort normal Abd: Soft, NT/ND, +BS, no HSM Psych: A&Ox4  Skin: no rashes noted, warm and dry  Lab Results:  Recent Labs    08/15/19 0230 08/16/19 0203  WBC 4.2 5.1  HGB 10.0* 9.8*  HCT 31.9* 31.4*  PLT 238 269   BMET Recent Labs    08/15/19 0230 08/16/19 0203  NA 134* 135  K 4.5 3.6  CL 100 104  CO2 23 24  GLUCOSE 100* 113*  BUN 11 8  CREATININE 0.78 0.72  CALCIUM 7.9* 7.7*   PT/INR Recent Labs    08/16/19 0203  LABPROT 14.7  INR 1.2   CMP     Component Value Date/Time   NA 135 08/16/2019 0203   K 3.6 08/16/2019 0203   CL 104 08/16/2019 0203   CO2 24 08/16/2019 0203   GLUCOSE 113 (H) 08/16/2019 0203   BUN 8 08/16/2019 0203   CREATININE 0.72 08/16/2019 0203   CREATININE 0.88 12/19/2018 1547   CALCIUM 7.7 (L) 08/16/2019 0203   PROT 6.0 (L) 08/15/2019 0230   ALBUMIN 1.5 (L) 08/16/2019 0203   AST 38 08/15/2019 0230   ALT 71 (H) 08/15/2019 0230   ALKPHOS 49 08/15/2019 0230   BILITOT 0.4 08/15/2019 0230   GFRNONAA >60 08/16/2019 0203   GFRAA >60 08/16/2019 0203   Lipase     Component Value Date/Time   LIPASE 51 08/13/2019  0609       Studies/Results: No results found.  Anti-infectives: Anti-infectives (From admission, onward)   Start     Dose/Rate Route Frequency Ordered Stop   08/13/19 2300  piperacillin-tazobactam (ZOSYN) IVPB 3.375 g     3.375 g 100 mL/hr over 30 Minutes Intravenous Every 8 hours 08/13/19 2149     08/13/19 0930  piperacillin-tazobactam (ZOSYN) IVPB 3.375 g     3.375 g 100 mL/hr over 30 Minutes Intravenous  Once 08/13/19 0925 08/13/19 1638       Assessment/Plan GERD Malnutrition - albumin 1.5 Ulcerative Colitis - Diagnosed 07/2018, currently takes Pepcid, daily iron, Lialda, oral prednisone taper daily. Sees Dr. Buford Dresser in [place] but was managed by Dr. Oneida Alar before she left the practice. GI following here, stopped steroids 5/27 ?septic thrombophlebitis  Left perirectal and retroperitoneal/LUQ fluid collections, suspect secondary to descending colonic perforation - nontoxic, no acute surgical needs  FEN: IVF, NPO for procedure ID: Zosyn 5/24>> VTE: SCD's, ok to start lovenox or sq heparin after procedure Foley: none Follow up: TBD  Plan: IR today for aspiration vs drain placement. Ok to restart CLD and protein supplements after procedure. Continue IV zosyn. Appreciate GI input. Will order PICC/TPN.   LOS: 2 days    Jerene Pitch  Callie Fielding, [facility] Surgery 08/16/2019, 8:20 AM Please see Amion for pager number during day hours 7:00am-4:30pm

## 2019-08-16 NOTE — Progress Notes (Signed)
Peripherally Inserted Central Catheter Placement  The IV Nurse has discussed with the patient and/or persons authorized to consent for the patient, the purpose of this procedure and the potential benefits and risks involved with this procedure.  The benefits include less needle sticks, lab draws from the catheter, and the patient may be discharged home with the catheter. Risks include, but not limited to, infection, bleeding, blood clot (thrombus formation), and puncture of an artery; nerve damage and irregular heartbeat and possibility to perform a PICC exchange if needed/ordered by physician.  Alternatives to this procedure were also discussed.  Bard Power PICC patient education guide, fact sheet on infection prevention and patient information card has been provided to patient /or left at bedside.    PICC Placement Documentation  PICC Double Lumen 08/16/19 PICC Right Brachial 39 cm 2 cm (Active)  Indication for Insertion or Continuance of Line Administration of hyperosmolar/irritating solutions (i.e. TPN, Vancomycin, etc.) 08/16/19 1900  Exposed Catheter (cm) 2 cm 08/16/19 1900  Site Assessment Clean;Dry;Intact 08/16/19 1900  Lumen #1 Status Flushed;Saline locked;Blood return noted 08/16/19 1900  Lumen #2 Status Flushed;Saline locked;Blood return noted 08/16/19 1900  Dressing Type Transparent;Securing device 08/16/19 1900  Dressing Status Clean;Dry;Intact;Antimicrobial disc in place 08/16/19 [place] checked and tightened 08/16/19 1900  Dressing Intervention New dressing 08/16/19 1900  Dressing Change Due 08/23/19 08/16/19 1900       Virgilio Belling 08/16/2019, 7:29 PM

## 2019-08-16 NOTE — Procedures (Signed)
Interventional Radiology Procedure Note  Procedure: CT guided drainage of Left Abd abscess, 88F drain.   Findings: The pelvic fluid is significantly improved. No pelvic drain  Complications: None Recommendations:  - To bulb drain - Do not submerge - Routine care   Signed,  Dulcy Fanny. Earleen Newport, DO

## 2019-08-16 NOTE — Progress Notes (Signed)
PHARMACY - TOTAL PARENTERAL NUTRITION CONSULT NOTE   Indication: severe malnutrition  Patient Measurements: Height: 5' 9"  (175.3 cm) Weight: 92.5 kg (203 lb 14.8 oz) IBW/kg (Calculated) : 70.7 TPN AdjBW (KG): 76.2 Body mass index is 30.11 kg/m.  Assessment: 51 yom with PMH UC, portal vein thrombosis, and laparoscopic converted to open cholecystectomy presenting to [facility] from [facility] for UC with L intra-abdominal fluid collection, suspected secondary to descending colonic perforation. Patient acute worsening of nausea and bilious emesis x 1 mo, non-bloody diarrhea 3-4x daily, and hematuria. Also reports poor PO intake over the last month. Patient reports wanting to eat but GI feels TPN appropriate for now to enhance ability to heal, allowing CLD for now pre-op.  Glucose / Insulin: No hx DM. Steroids d/c'd 5/26. CBGs controlled off TPN Electrolytes: all WNL, K 3.6 (s/p KCl 1mq PO x 1 per MD this AM) Renal: SCr stable wnl, BUN wnl. NS at 100 ml/hr LFTs / TGs: LFTs trend down - ALT now wnl, ALT 71, Tbili wnl (last 5/26) Prealbumin / albumin: prealbumin low 7.3, albumin 1.5 Intake / Output; MIVF: LBM 5/26 GI Imaging: 5/24 CT abd pelvis - L intra-abdominal gas/fluid collection, ?septic thrombophlebitis, ?colonic perforation Surgeries / Procedures: 5/27 - IR for drain placement  Central access: PICC placement planned for 08/16/19 per discussion with RN TPN start date: 08/16/19  Nutritional Goals (per RD recommendation on 5/26): kCal: 2000-2200, Protein: 110-125g, Fluid: >2L Goal TPN rate is 90 mL/hr (provides 121 g of protein and 2138 kcals per day)  Current Nutrition:  NPO  Plan:  Start TPN at 40 mL/hr at 1800. Start low and titrate to goal as appropriate. -This TPN will provide 54g protein, 134g CHO, and 28g SMOF lipids, for total 950 kCal, meeting 49% of protein and 48% of kCal needs. Electrolytes in TPN: standard - 554m/L of Na, 5077mL of K, 5mE18m of Ca, 5mEq16mof Mg, and 15mmo76mof  Phos. Cl:Ac ratio 1:1 Add standard MVI and trace elements to TPN (d/c PO MVI per discussion with MD due to patient refusing to take today due to fear of abdominal pain) Add folic acid 1mg pe89mD to TPN Initiate Moderate q6h SSI and adjust as needed  Reduce MIVF NS to 60 mL/hr at 1800 when TPN bag hung Monitor TPN labs on Mon/Thurs, GI/Surgery plans   Kahlin Mark vArturo MortonD, BCPS Please check AMION for all [facility] numbers Clinical Pharmacist 08/16/2019 8:51 AM

## 2019-08-17 DIAGNOSIS — E43 Unspecified severe protein-calorie malnutrition: Secondary | ICD-10-CM

## 2019-08-17 LAB — CBC
HCT: 33.2 % — ABNORMAL LOW (ref 39.0–52.0)
Hemoglobin: 10.4 g/dL — ABNORMAL LOW (ref 13.0–17.0)
MCH: 26.9 pg (ref 26.0–34.0)
MCHC: 31.3 g/dL (ref 30.0–36.0)
MCV: 85.8 fL (ref 80.0–100.0)
Platelets: 311 10*3/uL (ref 150–400)
RBC: 3.87 MIL/uL — ABNORMAL LOW (ref 4.22–5.81)
RDW: 12.9 % (ref 11.5–15.5)
WBC: 6.9 10*3/uL (ref 4.0–10.5)
nRBC: 0 % (ref 0.0–0.2)

## 2019-08-17 LAB — COMPREHENSIVE METABOLIC PANEL
ALT: 47 U/L — ABNORMAL HIGH (ref 0–44)
AST: 25 U/L (ref 15–41)
Albumin: 1.6 g/dL — ABNORMAL LOW (ref 3.5–5.0)
Alkaline Phosphatase: 48 U/L (ref 38–126)
Anion gap: 9 (ref 5–15)
BUN: 7 mg/dL (ref 6–20)
CO2: 22 mmol/L (ref 22–32)
Calcium: 7.6 mg/dL — ABNORMAL LOW (ref 8.9–10.3)
Chloride: 101 mmol/L (ref 98–111)
Creatinine, Ser: 0.69 mg/dL (ref 0.61–1.24)
GFR calc Af Amer: >60 mL/min (ref >60)
GFR calc non Af Amer: >60 mL/min (ref >60)
Glucose, Bld: 121 mg/dL — ABNORMAL HIGH (ref 70–99)
Potassium: 3.6 mmol/L (ref 3.5–5.1)
Sodium: 132 mmol/L — ABNORMAL LOW (ref 135–145)
Total Bilirubin: 0.4 mg/dL (ref 0.3–1.2)
Total Protein: 5.7 g/dL — ABNORMAL LOW (ref 6.5–8.1)

## 2019-08-17 LAB — DIFFERENTIAL
Abs Immature Granulocytes: 0.05 10*3/uL (ref 0.00–0.07)
Basophils Absolute: 0 10*3/uL (ref 0.0–0.1)
Basophils Relative: 0 %
Eosinophils Absolute: 0 10*3/uL (ref 0.0–0.5)
Eosinophils Relative: 0 %
Immature Granulocytes: 1 %
Lymphocytes Relative: 8 %
Lymphs Abs: 0.6 10*3/uL — ABNORMAL LOW (ref 0.7–4.0)
Monocytes Absolute: 0.3 10*3/uL (ref 0.1–1.0)
Monocytes Relative: 4 %
Neutro Abs: 6 10*3/uL (ref 1.7–7.7)
Neutrophils Relative %: 87 %

## 2019-08-17 LAB — GLUCOSE, CAPILLARY
Glucose-Capillary: 115 mg/dL — ABNORMAL HIGH (ref 70–99)
Glucose-Capillary: 119 mg/dL — ABNORMAL HIGH (ref 70–99)
Glucose-Capillary: 123 mg/dL — ABNORMAL HIGH (ref 70–99)
Glucose-Capillary: 135 mg/dL — ABNORMAL HIGH (ref 70–99)
Glucose-Capillary: 163 mg/dL — ABNORMAL HIGH (ref 70–99)

## 2019-08-17 LAB — TRIGLYCERIDES: Triglycerides: 58 mg/dL (ref <150)

## 2019-08-17 LAB — PREALBUMIN: Prealbumin: 11.9 mg/dL — ABNORMAL LOW (ref 18–38)

## 2019-08-17 LAB — MAGNESIUM: Magnesium: 1.7 mg/dL (ref 1.7–2.4)

## 2019-08-17 LAB — PHOSPHORUS: Phosphorus: 3.6 mg/dL (ref 2.5–4.6)

## 2019-08-17 MED ORDER — ENOXAPARIN SODIUM 40 MG/0.4ML ~~LOC~~ SOLN
40.0000 mg | Freq: Every day | SUBCUTANEOUS | Status: DC
  Administered 2019-08-17 – 2019-08-24 (×8): 40 mg via SUBCUTANEOUS
  Filled 2019-08-17 (×7): qty 0.4

## 2019-08-17 MED ORDER — TRAVASOL 10 % IV SOLN
INTRAVENOUS | Status: AC
  Filled 2019-08-17: qty 1209.6

## 2019-08-17 MED ORDER — POTASSIUM CHLORIDE 10 MEQ/50ML IV SOLN
10.0000 meq | INTRAVENOUS | Status: AC
  Administered 2019-08-17 (×4): 10 meq via INTRAVENOUS
  Filled 2019-08-17 (×4): qty 50

## 2019-08-17 NOTE — Progress Notes (Addendum)
PROGRESS NOTE    Brett Bright  IBB:[id number] DOB: 08-25-1960 DOA: 08/13/2019 PCP: Patient, No Pcp Per   Chief Complaint  Patient presents with  . Emesis    Brief Narrative:  HPI per Dr. Abigail Miyamoto is a 59 y.o. male with medical history of Ulcerative colitis, upper GI bleed, portal vein thrombus, GERD presenting with over 1 week history of nausea, vomiting, and diarrhea.  Patient was recently admitted to the hospital from 07/19/2019 to 07/20/2019 with ulcerative colitis flare.  He was discharged home with a prednisone taper.  He ultimately followed up with his gastroenterologist on 08/01/2019.  He has Lialda was increased to 4.8 g daily, and he was instructed to continue on his prednisone taper.  The patient states that he has tapered himself down to 10 mg of prednisone.  He began his 10 mg dose on 08/10/2019.  However, the patient has continued to have worsening nausea, vomiting, and diarrhea in the past week.  He denies any hematemesis, hematochezia, melena, fevers, chills.  He states that he has never had any abdominal pain even with his initial diagnosis of ulcerative colitis.  He states that he does not have any abdominal pain at this point.  He denies any fevers, chills, headache, chest pain, shortness breath, coughing, hemoptysis, hematemesis, dysuria, hematuria.  He denies any new medications.  He does not drink any alcohol or take any NSAIDs. In the emergency department, the patient has been afebrile and hemodynamically stable with oxygen saturation 96-100% on room air.  WBC was initially 14.9 with hemoglobin 11.4, and platelets 275,000.  Initial sodium was 127 with serum creatinine 0.79.  CT of the abdomen and pelvis showed Gas and fluid in the LEFT retroperitoneum and extending into peritoneal spaces via mesenteric pathways in the LEFT abdomen, associated with interloop fluid and gas as well as loculated collections in the mesorectum.  Because of the extensive distribution,  septic thrombophlebitis was favored associated with local colonic perforation.  There was gas noted along the IMV at the site of the previous thrombus into the left retroperitoneum. [facility] in transfer.  However the patient continued to wait in the emergency department for over 24 hours.  Subsequently, general surgery, Dr. Aviva Signs was contacted.  He recommended transfer to a higher level of care.  He ultimately spoke with general surgery, Dr. Jaymes Graff, at [facility] and the plan is to transfer the patient to [facility] for further surgical evaluation.  The patient was started on intravenous Zosyn and IV fluids.  Assessment & Plan:   Principal Problem:   Colon perforation ([place]) Active Problems:   Hyponatremia   Hypokalemia   Nausea vomiting and diarrhea   Ulcerative colitis ([facility])   Iron deficiency anemia due to chronic blood loss   Dehydration  1 concern for peritonitis secondary to perforated colon/intraabdominal fluid collection As noted on CT abdomen and pelvis.  Patient currently afebrile.  Normal white count.  Patient denies any abdominal pain.  Nausea vomiting diarrhea improving.  Patient initially seen by Dr. Arnoldo Morale, general surgery at [facility] who discussed case with general surgery at [facility] [place] and patient transferred to [facility] for further surgical evaluation.  Patient seen in consultation by general surgery this morning who feel patient is not toxic appearing and recommending management patient's ulcerative colitis.  Patient currently tolerating clear liquids.  Patient status post aspiration and drain placement of intra-abdominal fluid  collection by IR, 08/16/2019 with cultures pending.  IV Solu-Medrol discontinued per GI. Per general surgery no surgical needs at this time.  Patient started on TPN.  Continue empiric IV Zosyn.  Diet advancement per GI/general surgery.  GI and general surgery  following and appreciate input and recommendations.   2.  Ulcerative colitis  Patient states some improvement with nausea and emesis.  Abdominal pain improved.  Loose stools improved.  CRP elevated at 10.1.  Sed rate elevated at 70.  Currently tolerating clear liquids.  Patient was on IV Solu-Medrol which was discontinued 08/15/2019 per GI.  Continue IV Zosyn. Patient seen by GI who are recommended attempt by IR for drainage/aspiration of fluid collections if possible.  Patient status post aspiration and drainage of intra-abdominal fluid collection 08/16/2019 per Dr. Earleen Newport with purulent fluid noted in drain.  Intra-abdominal fluid cultures pending.  Patient on TPN per GI recommendations. GI following and I appreciate the input and recommendations.  3.  Hyponatremia Likely secondary to hypovolemic hyponatremia.  Improved with hydration.  Follow.    4.  Hypokalemia Repleted.  Potassium at 3.6.  Magnesium at 1.7.  Electrolytes being repleted per pharmacy via TPN.  Follow.    5.  Iron deficiency anemia anemia, unknown/folate deficiency. Likely secondary to problem #2.  Anemia panel consistent with anemia of chronic disease.  Folic acid level of 5.1.  Continue folate supplementation.  6.  Severe protein calorie malnutrition TPN initiated.  Currently on clear liquids.  Diet advancement per GI and general surgery.  Follow.    DVT prophylaxis: SCDs Code Status full Family Communication: Updated patient.  No family at bedside. Disposition:   Status is: Inpatient    Dispo: The patient is from: Home              Anticipated d/c is to: Home              Anticipated d/c date is: To be determined.              Patient currently on IV antibiotics, status post CT-guided drain placement to abdominal abscess, being followed by general surgery and GI due to ulcerative colitis flare and concern for peritonitis.         Consultants:   General surgery: Dr. Olena Leatherwood [facility]  08/13/2019  General surgery: Dr. Nyoka Cowden [facility] 08/15/2019  Gastroenterology: Dr. Rush Landmark 08/15/2019  Interventional radiology: Dr. Earleen Newport  Procedures:   CT abdomen and pelvis 08/13/2019  Drainage of left abdominal abscess with 10 French drain per IR/Dr. Earleen Newport 08/16/2019  Antimicrobials:   IV Zosyn 08/13/2019   Subjective: Patient in bed with a clear liquid tray.  Denies any nausea or vomiting.  No chest pain.  No shortness of breath.  No abdominal pain.  Denies any further loose stools.  Status post drain placement left intra-abdominal fluid collection with output of 105 cc over the past 24 hours.  Objective: Vitals:   08/16/19 1430 08/16/19 1518 08/16/19 2110 08/17/19 0448  BP: 113/79 90/60 114/78 101/75  Pulse: 85 84 (!) 110 (!) 106  Resp: 13 16 20 17   Temp:  98.1 F (36.7 C) 98.7 F (37.1 C) 98.8 F (37.1 C)  TempSrc:  Oral Oral   SpO2: 98% 99% 97% 96%  Weight:      Height:        Intake/Output Summary (Last 24 hours) at 08/17/2019 1012 Last data filed at 08/17/2019 0600 Gross per 24 hour  Intake 939.51 ml  Output 107  ml  Net 832.51 ml   Filed Weights   08/13/19 0549 08/14/19 1959  Weight: 92.5 kg 92.5 kg    Examination:  General exam: NAD Respiratory system: Lungs clear to auscultation bilaterally.  No wheezes, no crackles no rhonchi.  Normal respiratory effort.  Cardiovascular system: RRR no murmurs rubs or gallops.  No JVD.  No lower extremity edema.  Gastrointestinal system: Abdomen is nontender, nondistended, soft, positive bowel sounds.  No rebound.  No guarding.   Drain with purulent material noted in bulb. Central nervous system: Alert and oriented. No focal neurological deficits. Extremities: Symmetric 5 x 5 power. Skin: No rashes, lesions or ulcers Psychiatry: Judgement and insight appear normal. Mood & affect appropriate.     Data Reviewed: I have personally reviewed following labs and imaging studies  CBC: Recent Labs  Lab  08/13/19 0609 08/14/19 0701 08/15/19 0230 08/16/19 0203 08/17/19 0434  WBC 14.9* 8.6 4.2 5.1 6.9  NEUTROABS 13.0*  --   --  3.7 6.0  HGB 11.4* 9.7* 10.0* 9.8* 10.4*  HCT 35.5* 31.2* 31.9* 31.4* 33.2*  MCV 83.9 86.9 85.3 85.3 85.8  PLT 312 275 238 269 370    Basic Metabolic Panel: Recent Labs  Lab 08/13/19 0609 08/14/19 0701 08/15/19 0230 08/16/19 0203 08/17/19 0434  NA 127* 132* 134* 135 132*  K 3.2* 3.6 4.5 3.6 3.6  CL 93* 98 100 104 101  CO2 23 23 23 24 22   GLUCOSE 121* 100* 100* 113* 121*  BUN 13 13 11 8 7   CREATININE 0.78 0.80 0.78 0.72 0.69  CALCIUM 8.1* 7.7* 7.9* 7.7* 7.6*  MG 2.1  --   --  2.0 1.7  PHOS  --   --   --  3.2 3.6    GFR: Estimated Creatinine Clearance: 113 mL/min (by C-G formula based on SCr of 0.69 mg/dL).  Liver Function Tests: Recent Labs  Lab 08/13/19 0609 08/15/19 0230 08/16/19 0203 08/17/19 0434  AST 107* 38  --  25  ALT 143* 71*  --  47*  ALKPHOS 77 49  --  48  BILITOT 0.6 0.4  --  0.4  PROT 7.2 6.0*  --  5.7*  ALBUMIN 2.3* 1.5* 1.5* 1.6*    CBG: Recent Labs  Lab 08/17/19 0009 08/17/19 0451  GLUCAP 163* 119*     Recent Results (from the past 240 hour(s))  Culture, blood (routine x 2)     Status: None (Preliminary result)   Collection Time: 08/13/19 10:03 AM   Specimen: BLOOD RIGHT HAND  Result Value Ref Range Status   Specimen Description BLOOD RIGHT HAND  Final   Special Requests   Final    BOTTLES DRAWN AEROBIC AND ANAEROBIC Blood Culture adequate volume   Culture   Final    NO GROWTH 4 DAYS Performed at [facility], [address], [place], [place] [address]    Report Status PENDING  Incomplete  Culture, blood (routine x 2)     Status: None (Preliminary result)   Collection Time: 08/13/19 10:08 AM   Specimen: BLOOD LEFT HAND  Result Value Ref Range Status   Specimen Description BLOOD LEFT HAND  Final   Special Requests   Final    BOTTLES DRAWN AEROBIC AND ANAEROBIC Blood Culture adequate volume   Culture    Final    NO GROWTH 4 DAYS Performed at [facility] [address] [place],  [address]    Report Status PENDING  Incomplete  SARS Coronavirus 2 by RT  PCR (hospital order, performed in [facility] hospital lab) Nasopharyngeal Nasopharyngeal Swab     Status: None   Collection Time: 08/13/19 11:33 AM   Specimen: Nasopharyngeal Swab  Result Value Ref Range Status   SARS Coronavirus 2 NEGATIVE NEGATIVE Final    Comment: (NOTE) SARS-CoV-2 target nucleic acids are NOT DETECTED. The SARS-CoV-2 RNA is generally detectable in upper and lower respiratory specimens during the acute phase of infection. The lowest concentration of SARS-CoV-2 viral copies this assay can detect is 250 copies / mL. A negative result does not preclude SARS-CoV-2 infection and should not be used as the sole basis for treatment or other patient management decisions.  A negative result may occur with improper specimen collection / handling, submission of specimen other than nasopharyngeal swab, presence of viral mutation(s) within the areas targeted by this assay, and inadequate number of viral copies (<250 copies / mL). A negative result must be combined with clinical observations, patient history, and epidemiological information. Fact Sheet for Patients:   StrictlyIdeas.no Fact Sheet for Healthcare Providers: BankingDealers.co.za This test is not yet approved or cleared  by the [place] FDA and has been authorized for detection and/or diagnosis of SARS-CoV-2 by FDA under an Emergency Use Authorization (EUA).  This EUA will remain in effect (meaning this test can be used) for the duration of the COVID-19 declaration under Section 564(b)(1) of the Act, 21 U.S.C. section 360bbb-3(b)(1), unless the authorization is terminated or revoked sooner. Performed at [facility], [address] [place], [place] [address]   Aerobic/Anaerobic Culture (surgical/deep wound)      Status: None (Preliminary result)   Collection Time: 08/16/19  2:27 PM   Specimen: Abscess  Result Value Ref Range Status   Specimen Description ABSCESS  Final   Special Requests NONE  Final   Gram Stain   Final    ABUNDANT WBC PRESENT, PREDOMINANTLY PMN ABUNDANT GRAM NEGATIVE RODS ABUNDANT GRAM POSITIVE COCCI IN PAIRS IN CHAINS MODERATE GRAM POSITIVE RODS Performed at [facility], [address], [place],  [address]    Culture PENDING  Incomplete   Report Status PENDING  Incomplete         Radiology Studies: CT IMAGE GUIDED DRAINAGE BY PERCUTANEOUS CATHETER  Result Date: 08/16/2019 INDICATION: 60 year old male with a history of diverticular abscess EXAM: CT GUIDED DRAINAGE OF LEFT ABDOMINAL ABSCESS MEDICATIONS: The patient is currently admitted to the hospital and receiving intravenous antibiotics. The antibiotics were administered within an appropriate time frame prior to the initiation of the procedure. ANESTHESIA/SEDATION: 1.0 mg IV Versed 50 mcg IV Fentanyl Moderate Sedation Time:  45 minutes The patient was continuously monitored during the procedure by the interventional radiology nurse under my direct supervision. COMPLICATIONS: None TECHNIQUE: Informed written consent was obtained from the patient after a thorough discussion of the procedural risks, benefits and alternatives. All questions were addressed. Maximal Sterile Barrier Technique was utilized including caps, mask, sterile gowns, sterile gloves, sterile drape, hand hygiene and skin antiseptic. A timeout was performed prior to the initiation of the procedure. PROCEDURE: The left abdomen was prepped with chlorhexidine in a sterile fashion, and a sterile drape was applied covering the operative field. A sterile gown and sterile gloves were used for the procedure. Local anesthesia was provided with 1% Lidocaine. Using CT guidance, trocar needle was advanced into the abscess from an anterior approach. Modified  Seldinger technique was then used to place a 10 [place] drain into the abscess. Approximately 35 cc of purulent material aspirated.  Drain was attached to bulb suction. Drain was sutured in position and a final image was stored. Patient tolerated the procedure well and remained hemodynamically stable throughout. No complications were encountered and no significant blood loss. FINDINGS: CT of the abdomen demonstrates persisting abscess of the left abdomen, compatible with diverticular abscess. CT scan continuing through the pelvis demonstrates significant reduction in the small fluid collection in the left perirectal space. We elected not to proceed with attempted drainage of this small residual inflammatory change. IMPRESSION: Status post CT-guided drainage of left abdominal abscess. Signed, Dulcy Fanny. Dellia Nims, RPVI [facility] Radiology Electronically Signed   By: Corrie Mckusick D.O.   On: 08/16/2019 15:42   [place] EKG SITE RITE  Result Date: 08/16/2019 If Site Rite image not attached, placement could not be confirmed due to current cardiac rhythm.       Scheduled Meds: . Chlorhexidine Gluconate Cloth  6 each Topical Daily  . enoxaparin (LOVENOX) injection  40 mg Subcutaneous Daily  . feeding supplement  1 Container Oral TID BM  . insulin aspart  0-15 Units Subcutaneous Q6H  . sodium chloride flush  10-40 mL Intracatheter Q12H  . sodium chloride flush  5 mL Intracatheter Q8H   Continuous Infusions: . sodium chloride 60 mL/hr at 08/16/19 1744  . piperacillin-tazobactam 3.375 g (08/17/19 0515)  . potassium chloride    . TPN ADULT (ION) 40 mL/hr at 08/17/19 0300  . TPN ADULT (ION)       LOS: 3 days    Time spent: 35 minutes    Irine Seal, MD [facility]   To contact the attending provider between 7A-7P or the covering provider during after hours 7P-7A, please log into the web site [email or web address] and access using universal [facility] password for that web site. If you do not have the password, please call the hospital operator.  08/17/2019, 10:12 AM

## 2019-08-17 NOTE — Progress Notes (Signed)
PHARMACY - TOTAL PARENTERAL NUTRITION CONSULT NOTE   Indication: severe malnutrition  Patient Measurements: Height: 5' 9"  (175.3 cm) Weight: 92.5 kg (203 lb 14.8 oz) IBW/kg (Calculated) : 70.7 TPN AdjBW (KG): 76.2 Body mass index is 30.11 kg/m.  Assessment: 69 yom with PMH UC, portal vein thrombosis, and laparoscopic converted to open cholecystectomy presenting to [facility] from [facility] for UC with L intra-abdominal fluid collection, suspected secondary to descending colonic perforation. Patient acute worsening of nausea and bilious emesis x 1 mo, non-bloody diarrhea 3-4x daily, and hematuria. Also reports poor PO intake over the last month. Patient reports wanting to eat but GI feels TPN appropriate for now to enhance ability to heal, allowing CLD for now pre-op.  Glucose / Insulin: No hx DM. Steroids d/c'd 5/26. CBGs controlled on TPN. 3 units regular insulin in last 24 hours Electrolytes: all WNL, K 3.6 (will replace) Renal: SCr stable wnl, BUN wnl. NS at 60 ml/hr LFTs / TGs: LFTs trend down - AST now wnl, ALT 47, Tbili wnl  Prealbumin / albumin: prealbumin low 7.3>11.9, albumin 1.6 Intake / Output; MIVF: LBM 5/26, Urine output not recorded, Dran 105 mls out GI Imaging: 5/24 CT abd pelvis - L intra-abdominal gas/fluid collection, ?septic thrombophlebitis, ?colonic perforation Surgeries / Procedures: 5/27 - IR for drain placement  Central access: PICC placed 08/16/19 TPN start date: 08/16/19  Nutritional Goals (per RD recommendation on 5/26): kCal: 2000-2200, Protein: 110-125g, Fluid: >2L Goal TPN rate is 90 mL/hr (provides 121 g of protein and 2138 kcals per day)  Current Nutrition:  NPO  Plan:  Increase TPN rate to 90 mL/hr at 1800. Start low and titrate to goal as appropriate. -This TPN will provide 121g protein, 302g CHO, and 62.6g SMOF lipids, for total 2138 kCal, meeting 49% of protein and 48% of kCal needs. Electrolytes in TPN: standard - 10mq/L of Na, 531m/L of K, 3m14mL of Ca,  3mE64m of Mg, and 13mm13m of Phos. Cl:Ac ratio 1:1 Add standard MVI and trace elements to TPN  Add folic acid 1mg p53mMD to TPN Initiate Moderate q6h SSI and adjust as needed  D/c MIVF NS at 1800 when TPN bag hung Monitor TPN labs on Mon/Thurs and tomorrow, GI/Surgery plans  Cathy Alanda SlimmD, FCCM [facility] - In[facility] Pharmacist Please see AMION for all Pharmacists' Contact Phone Numbers 08/17/2019, 8:40 AM

## 2019-08-17 NOTE — Progress Notes (Signed)
Referring Physician(s): Dr. Georgette Dover  Supervising Physician: Aletta Edouard  Patient Status:  [facility] - In-pt  Chief Complaint: Intra-abdominal fluid collection  Subjective: Resting in chair.  Reports improvement in symptoms after drain placement yesterday. Complains of foul-smelling output.   Allergies: Patient has no known allergies.  Medications: Prior to Admission medications   Medication Sig Start Date End Date Taking? Authorizing Provider  acetaminophen (TYLENOL) 325 MG tablet Take 325 mg by mouth every 6 (six) hours as needed for fever.   Yes [provider]  Ascorbic Acid (VITAMIN C PO) Take 1 tablet by mouth daily. Drops daily    Yes [provider]  ferrous sulfate (FEROSUL) 325 (65 FE) MG tablet Take 1 tablet (325 mg total) by mouth daily with breakfast. 07/20/19  Yes Johnson, Clanford L, MD  mesalamine (LIALDA) 1.2 g EC tablet Take 4 tablets (4.8 g total) by mouth daily with breakfast. 07/20/19  Yes Johnson, Clanford L, MD  omeprazole (PRILOSEC) 20 MG capsule Take 20 mg by mouth daily.   Yes [provider]  predniSONE (DELTASONE) 10 MG tablet Take 4 PO QAM x7days, 3 PO QAM x7days, 2 PO QAM x7days, 1 po QAM x 7 Patient taking differently: Take 10-40 mg by mouth See admin instructions. Take 40 mg every morning for 7 days, then 30 mg every morning for 7 days, then 20 mg every morning for 7 days,  then 10 mg every morning for 7 days, then stop. 07/21/19  Yes Johnson, Clanford L, MD     Vital Signs: BP 101/75 (BP Location: Left Arm)   Pulse (!) 106   Temp 98.8 F (37.1 C)   Resp 17   Ht 5' 9"  (1.753 m)   Wt 203 lb 14.8 oz (92.5 kg)   SpO2 96%   BMI 30.11 kg/m   Physical Exam  NAD, alert Abdomen:  LLQ drain in place. Insertion site c/d/i. Thick, purulent, foul-smelling output.   Imaging: CT IMAGE GUIDED DRAINAGE BY PERCUTANEOUS CATHETER  Result Date: 08/16/2019 INDICATION: 59 year old male with a history of diverticular abscess EXAM: CT  GUIDED DRAINAGE OF LEFT ABDOMINAL ABSCESS MEDICATIONS: The patient is currently admitted to the hospital and receiving intravenous antibiotics. The antibiotics were administered within an appropriate time frame prior to the initiation of the procedure. ANESTHESIA/SEDATION: 1.0 mg IV Versed 50 mcg IV Fentanyl Moderate Sedation Time:  45 minutes The patient was continuously monitored during the procedure by the interventional radiology nurse under my direct supervision. COMPLICATIONS: None TECHNIQUE: Informed written consent was obtained from the patient after a thorough discussion of the procedural risks, benefits and alternatives. All questions were addressed. Maximal Sterile Barrier Technique was utilized including caps, mask, sterile gowns, sterile gloves, sterile drape, hand hygiene and skin antiseptic. A timeout was performed prior to the initiation of the procedure. PROCEDURE: The left abdomen was prepped with chlorhexidine in a sterile fashion, and a sterile drape was applied covering the operative field. A sterile gown and sterile gloves were used for the procedure. Local anesthesia was provided with 1% Lidocaine. Using CT guidance, trocar needle was advanced into the abscess from an anterior approach. Modified Seldinger technique was then used to place a 10 [place] drain into the abscess. Approximately 35 cc of purulent material aspirated. Drain was attached to bulb suction. Drain was sutured in position and a final image was stored. Patient tolerated the procedure well and remained hemodynamically stable throughout. No complications were encountered and no significant blood loss. FINDINGS: CT of the abdomen  demonstrates persisting abscess of the left abdomen, compatible with diverticular abscess. CT scan continuing through the pelvis demonstrates significant reduction in the small fluid collection in the left perirectal space. We elected not to proceed with attempted drainage of this small residual  inflammatory change. IMPRESSION: Status post CT-guided drainage of left abdominal abscess. Signed, Dulcy Fanny. Dellia Nims, RPVI Vascular and Interventional Radiology Specialists [facility] Radiology Electronically Signed   By: Corrie Mckusick D.O.   On: 08/16/2019 15:42   [place] EKG SITE RITE  Result Date: 08/16/2019 If Site Rite image not attached, placement could not be confirmed due to current cardiac rhythm.   Labs:  CBC: Recent Labs    08/14/19 0701 08/15/19 0230 08/16/19 0203 08/17/19 0434  WBC 8.6 4.2 5.1 6.9  HGB 9.7* 10.0* 9.8* 10.4*  HCT 31.2* 31.9* 31.4* 33.2*  PLT 275 238 269 311    COAGS: Recent Labs    08/16/19 0203  INR 1.2    BMP: Recent Labs    08/14/19 0701 08/15/19 0230 08/16/19 0203 08/17/19 0434  NA 132* 134* 135 132*  K 3.6 4.5 3.6 3.6  CL 98 100 104 101  CO2 23 23 24 22   GLUCOSE 100* 100* 113* 121*  BUN 13 11 8 7   CALCIUM 7.7* 7.9* 7.7* 7.6*  CREATININE 0.80 0.78 0.72 0.69  GFRNONAA >60 >60 >60 >60  GFRAA >60 >60 >60 >60    LIVER FUNCTION TESTS: Recent Labs    07/20/19 0445 07/20/19 0445 08/13/19 0609 08/15/19 0230 08/16/19 0203 08/17/19 0434  BILITOT 0.5  --  0.6 0.4  --  0.4  AST 42*  --  107* 38  --  25  ALT 42  --  143* 71*  --  47*  ALKPHOS 69  --  77 49  --  48  PROT 6.7  --  7.2 6.0*  --  5.7*  ALBUMIN 2.6*   < > 2.3* 1.5* 1.5* 1.6*   < > = values in this interval not displayed.    Assessment and Plan: Intra-abdominal fluid collection s/p aspiration and drainage 5/28 by Dr. Earleen Newport. Patient reports improvement in abdominal pain today.  Drain is putting out thick, purulent fluid.  WBC 6.9, afeb Culture growing gram negative rods, gram positive cocci in pairs, gram positive rods  Continues current drain care.  IR to follow.   Electronically Signed: Docia Barrier, PA 08/17/2019, 2:46 PM   I spent a total of 15 Minutes at the the patient's bedside AND on the patient's hospital floor or unit, greater than 50% of  which was counseling/coordinating care for intra-abdominal fluid collection.

## 2019-08-17 NOTE — Progress Notes (Signed)
Emptied JP drain, thick cloudy tan with an odor, 30cc, stripped tubing, will get citrus aromatherapy for the odor at request of pt.

## 2019-08-17 NOTE — Progress Notes (Signed)
Daily Rounding Note  08/17/2019, 9:39 AM  LOS: 3 days   SUBJECTIVE:   Chief complaint: Intra-abdominal, retroperitoneal fluid collections abscesses. Drain placed to L abd abscess 5/27. No drain placed to pelvic fluid collection as it appeared significantly improved.  Abdominal pain for a few hours during, after the drain was placed but it has resolved.  Tolerating clear liquids. Drain output 105 mL recorded yest.   Loose, brown stool.  No N/V.    OBJECTIVE:         Vital signs in last 24 hours:    Temp:  [98.1 F (36.7 C)-98.8 F (37.1 C)] 98.8 F (37.1 C) (05/28 0448) Pulse Rate:  [84-110] 106 (05/28 0448) Resp:  [13-20] 17 (05/28 0448) BP: (90-114)/(60-80) 101/75 (05/28 0448) SpO2:  [96 %-100 %] 96 % (05/28 0448) Last BM Date: 08/17/19 Filed Weights   08/13/19 0549 08/14/19 1959  Weight: 92.5 kg 92.5 kg   General: Does not look ill.  Comfortable.  Alert Heart: RRR Chest: Clear bilaterally without labored breathing Abdomen: Soft.  Tender across upper abdomen but no guarding or rebound. Extremities: No CCE. Neuro/Psych: Alert.  Appropriate.  Fully oriented.  No gross deficits.  Intake/Output from previous day: 05/27 0701 - 05/28 0700 In: 939.5 [I.V.:795.9; IV Piggyback:143.6] Out: 107 [Urine:2; Drains:105]  Intake/Output this shift: No intake/output data recorded.  Lab Results: Recent Labs    08/15/19 0230 08/16/19 0203 08/17/19 0434  WBC 4.2 5.1 6.9  HGB 10.0* 9.8* 10.4*  HCT 31.9* 31.4* 33.2*  PLT 238 269 311   BMET Recent Labs    08/15/19 0230 08/16/19 0203 08/17/19 0434  NA 134* 135 132*  K 4.5 3.6 3.6  CL 100 104 101  CO2 23 24 22   GLUCOSE 100* 113* 121*  BUN 11 8 7   CREATININE 0.78 0.72 0.69  CALCIUM 7.9* 7.7* 7.6*   LFT Recent Labs    08/15/19 0230 08/16/19 0203 08/17/19 0434  PROT 6.0*  --  5.7*  ALBUMIN 1.5* 1.5* 1.6*  AST 38  --  25  ALT 71*  --  47*  ALKPHOS 49  --  48   BILITOT 0.4  --  0.4   PT/INR Recent Labs    08/16/19 0203  LABPROT 14.7  INR 1.2   Hepatitis Panel No results for input(s): HEPBSAG, HCVAB, HEPAIGM, HEPBIGM in the last 72 hours.  Studies/Results: CT IMAGE GUIDED DRAINAGE BY PERCUTANEOUS CATHETER  Result Date: 08/16/2019 INDICATION: 59 year old male with a history of diverticular abscess EXAM: CT GUIDED DRAINAGE OF LEFT ABDOMINAL ABSCESS MEDICATIONS: The patient is currently admitted to the hospital and receiving intravenous antibiotics. The antibiotics were administered within an appropriate time frame prior to the initiation of the procedure. ANESTHESIA/SEDATION: 1.0 mg IV Versed 50 mcg IV Fentanyl Moderate Sedation Time:  45 minutes The patient was continuously monitored during the procedure by the interventional radiology nurse under my direct supervision. COMPLICATIONS: None TECHNIQUE: Informed written consent was obtained from the patient after a thorough discussion of the procedural risks, benefits and alternatives. All questions were addressed. Maximal Sterile Barrier Technique was utilized including caps, mask, sterile gowns, sterile gloves, sterile drape, hand hygiene and skin antiseptic. A timeout was performed prior to the initiation of the procedure. PROCEDURE: The left abdomen was prepped with chlorhexidine in a sterile fashion, and a sterile drape was applied covering the operative field. A sterile gown and sterile gloves were used for the procedure. Local anesthesia was provided with  1% Lidocaine. Using CT guidance, trocar needle was advanced into the abscess from an anterior approach. Modified Seldinger technique was then used to place a 10 [place] drain into the abscess. Approximately 35 cc of purulent material aspirated. Drain was attached to bulb suction. Drain was sutured in position and a final image was stored. Patient tolerated the procedure well and remained hemodynamically stable throughout. No complications were  encountered and no significant blood loss. FINDINGS: CT of the abdomen demonstrates persisting abscess of the left abdomen, compatible with diverticular abscess. CT scan continuing through the pelvis demonstrates significant reduction in the small fluid collection in the left perirectal space. We elected not to proceed with attempted drainage of this small residual inflammatory change. IMPRESSION: Status post CT-guided drainage of left abdominal abscess. Signed, Dulcy Fanny. Dellia Nims, RPVI Vascular and Interventional Radiology Specialists [facility] Radiology Electronically Signed   By: Corrie Mckusick D.O.   On: 08/16/2019 15:42   [place] EKG SITE RITE  Result Date: 08/16/2019 If Site Rite image not attached, placement could not be confirmed due to current cardiac rhythm.  Scheduled Meds:  Chlorhexidine Gluconate Cloth  6 each Topical Daily   enoxaparin (LOVENOX) injection  40 mg Subcutaneous Daily   feeding supplement  1 Container Oral TID BM   insulin aspart  0-15 Units Subcutaneous Q6H   sodium chloride flush  10-40 mL Intracatheter Q12H   sodium chloride flush  5 mL Intracatheter Q8H   Continuous Infusions:  sodium chloride 60 mL/hr at 08/16/19 1744   piperacillin-tazobactam 3.375 g (08/17/19 0515)   potassium chloride     TPN ADULT (ION) 40 mL/hr at 08/17/19 0300   TPN ADULT (ION)     PRN Meds:.acetaminophen **OR** acetaminophen, morphine, ondansetron **OR** ondansetron (ZOFRAN) IV, sodium chloride flush   ASSESMENT:   *   Ulcerative colitis (though current situation raises ? Crohn's) with complications of perforation, intra-abdominal and retroperitoneal abscesses/fluid collections.  ? septic thrombophlebitis.? Diverticular perforation rather than perforation due to IBD, the former is more common, the latter is very rare. Day 5 Zosyn.  Steroids discontinued as of 5/26. drain (drains) placement today.    *   Protein malnutrition.  TPN since 5/27.  Additionally on clear liquid  diet.  *   IMV thrombosis, prior portal thrombosis.   SQ Lovenox in place.   *   Liver lobularity, ? Fibrosis/cirrhosis.  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   *    IDA, low ferritin, lower normal Iron, low TIBC, elevated iron sats.   Hgb stable.   PLAN   *   Continue supportive care.  Defer advancing diet to surgery service.    Brett Bright  08/17/2019, 9:39 AM Phone [phone]

## 2019-08-17 NOTE — Progress Notes (Signed)
[facility] Progress Note     Subjective: CC-  Very sore from drain placement. Denies n/v. Tolerating clear liquids. Likes the [employer]. Small loose BM over night, no flatus. WBC 6.9, afebrile  Objective: Vital signs in last 24 hours: Temp:  [98.1 F (36.7 C)-98.8 F (37.1 C)] 98.8 F (37.1 C) (05/28 0448) Pulse Rate:  [84-110] 106 (05/28 0448) Resp:  [13-20] 17 (05/28 0448) BP: (90-114)/(60-80) 101/75 (05/28 0448) SpO2:  [96 %-100 %] 96 % (05/28 0448) Last BM Date: 08/17/19  Intake/Output from previous day: 05/27 0701 - 05/28 0700 In: 939.5 [I.V.:795.9; IV Piggyback:143.6] Out: 107 [Urine:2; Drains:105] Intake/Output this shift: No intake/output data recorded.  PE: Gen:  Alert, NAD, pleasant Card:  RRR Pulm:  CTAB, no W/R/R, rate and effort normal Abd: Soft, ND, TTP around drain, drain with thick/white fluid in bulb, +BS, no HSM Psych: A&Ox4  Skin: no rashes noted, warm and dry   Lab Results:  Recent Labs    08/16/19 0203 08/17/19 0434  WBC 5.1 6.9  HGB 9.8* 10.4*  HCT 31.4* 33.2*  PLT 269 311   BMET Recent Labs    08/16/19 0203 08/17/19 0434  NA 135 132*  K 3.6 3.6  CL 104 101  CO2 24 22  GLUCOSE 113* 121*  BUN 8 7  CREATININE 0.72 0.69  CALCIUM 7.7* 7.6*   PT/INR Recent Labs    08/16/19 0203  LABPROT 14.7  INR 1.2   CMP     Component Value Date/Time   NA 132 (L) 08/17/2019 0434   K 3.6 08/17/2019 0434   CL 101 08/17/2019 0434   CO2 22 08/17/2019 0434   GLUCOSE 121 (H) 08/17/2019 0434   BUN 7 08/17/2019 0434   CREATININE 0.69 08/17/2019 0434   CREATININE 0.88 12/19/2018 1547   CALCIUM 7.6 (L) 08/17/2019 0434   PROT 5.7 (L) 08/17/2019 0434   ALBUMIN 1.6 (L) 08/17/2019 0434   AST 25 08/17/2019 0434   ALT 47 (H) 08/17/2019 0434   ALKPHOS 48 08/17/2019 0434   BILITOT 0.4 08/17/2019 0434   GFRNONAA >60 08/17/2019 0434   GFRAA >60 08/17/2019 0434   Lipase     Component Value Date/Time   LIPASE 51 08/13/2019 0609        Studies/Results: CT IMAGE GUIDED DRAINAGE BY PERCUTANEOUS CATHETER  Result Date: 08/16/2019 INDICATION: 59 year old male with a history of diverticular abscess EXAM: CT GUIDED DRAINAGE OF LEFT ABDOMINAL ABSCESS MEDICATIONS: The patient is currently admitted to the hospital and receiving intravenous antibiotics. The antibiotics were administered within an appropriate time frame prior to the initiation of the procedure. ANESTHESIA/SEDATION: 1.0 mg IV Versed 50 mcg IV Fentanyl Moderate Sedation Time:  45 minutes The patient was continuously monitored during the procedure by the interventional radiology nurse under my direct supervision. COMPLICATIONS: None TECHNIQUE: Informed written consent was obtained from the patient after a thorough discussion of the procedural risks, benefits and alternatives. All questions were addressed. Maximal Sterile Barrier Technique was utilized including caps, mask, sterile gowns, sterile gloves, sterile drape, hand hygiene and skin antiseptic. A timeout was performed prior to the initiation of the procedure. PROCEDURE: The left abdomen was prepped with chlorhexidine in a sterile fashion, and a sterile drape was applied covering the operative field. A sterile gown and sterile gloves were used for the procedure. Local anesthesia was provided with 1% Lidocaine. Using CT guidance, trocar needle was advanced into the abscess from an anterior approach. Modified Seldinger technique was then used to place  a 10 French drain into the abscess. Approximately 35 cc of purulent material aspirated. Drain was attached to bulb suction. Drain was sutured in position and a final image was stored. Patient tolerated the procedure well and remained hemodynamically stable throughout. No complications were encountered and no significant blood loss. FINDINGS: CT of the abdomen demonstrates persisting abscess of the left abdomen, compatible with diverticular abscess. CT scan continuing through the  pelvis demonstrates significant reduction in the small fluid collection in the left perirectal space. We elected not to proceed with attempted drainage of this small residual inflammatory change. IMPRESSION: Status post CT-guided drainage of left abdominal abscess. Signed, Dulcy Fanny. Dellia Nims, RPVI Vascular and Interventional Radiology Specialists [facility] Radiology Electronically Signed   By: Corrie Mckusick D.O.   On: 08/16/2019 15:42   [place] EKG SITE RITE  Result Date: 08/16/2019 If Site Rite image not attached, placement could not be confirmed due to current cardiac rhythm.   Anti-infectives: Anti-infectives (From admission, onward)   Start     Dose/Rate Route Frequency Ordered Stop   08/13/19 2300  piperacillin-tazobactam (ZOSYN) IVPB 3.375 g     3.375 g 100 mL/hr over 30 Minutes Intravenous Every 8 hours 08/13/19 2149     08/13/19 0930  piperacillin-tazobactam (ZOSYN) IVPB 3.375 g     3.375 g 100 mL/hr over 30 Minutes Intravenous  Once 08/13/19 0925 08/13/19 1638       Assessment/Plan GERD Severe Malnutrition - prealbumin 7.3 (5/28), started TPN Ulcerative Colitis- Diagnosed 07/2018, currently takes Pepcid, daily iron, Lialda, oral prednisone taper daily. Sees Dr. Buford Dresser in [place] but was managed by Dr. Oneida Alar before she left the practice. GI following here, stopped steroids 5/27 ?septic thrombophlebitis  Left perirectal and retroperitoneal/LUQ fluid collections, suspect secondary to descending colonic perforation - s/p IR drain 5/27, culture pending - nontoxic, no acute surgical needs  FEN: IVF, CLD, Boost ID: Zosyn 5/24>> VTE: SCD's, ok for chemical DVT prophylaxis from surgical standpoint Foley: none Follow up: TBD  Plan: Continue IR drain and IV antibiotics, follow culture report. Ok for full liquids from surgical standpoint, but will leave diet advancement to GI. Continue TPN. Likely plan to repeat CT scan at some point next week.   LOS: 3 days    [facility] 08/17/2019, 8:53 AM Please see Amion for pager number during day hours 7:00am-4:30pm

## 2019-08-17 NOTE — Plan of Care (Signed)
  Problem: Education: Goal: Knowledge of General Education information will improve Description: Including pain rating scale, medication(s)/side effects and non-pharmacologic comfort measures Outcome: Progressing   Problem: Health Behavior/Discharge Planning: Goal: Ability to manage health-related needs will improve Outcome: Progressing  Patient with episode of bowel incontinence, reassurance and education provided as to causes of incontinence such as antibiotics.   Problem: Activity: Goal: Risk for activity intolerance will decrease Outcome: Progressing  Patient performs adl's without complaint of sob or worsening pain.  Problem: Nutrition: Goal: Adequate nutrition will be maintained Outcome: Progressing  Patient accepts boost in between meals, tolerates clear liquids.

## 2019-08-18 LAB — C-REACTIVE PROTEIN: CRP: 13.6 mg/dL — ABNORMAL HIGH (ref <1.0)

## 2019-08-18 LAB — BASIC METABOLIC PANEL
Anion gap: 8 (ref 5–15)
BUN: 7 mg/dL (ref 6–20)
CO2: 23 mmol/L (ref 22–32)
Calcium: 7.7 mg/dL — ABNORMAL LOW (ref 8.9–10.3)
Chloride: 101 mmol/L (ref 98–111)
Creatinine, Ser: 0.59 mg/dL — ABNORMAL LOW (ref 0.61–1.24)
GFR calc Af Amer: >60 mL/min (ref >60)
GFR calc non Af Amer: >60 mL/min (ref >60)
Glucose, Bld: 120 mg/dL — ABNORMAL HIGH (ref 70–99)
Potassium: 3.7 mmol/L (ref 3.5–5.1)
Sodium: 132 mmol/L — ABNORMAL LOW (ref 135–145)

## 2019-08-18 LAB — HEPATITIS A ANTIBODY, TOTAL: hep A Total Ab: NONREACTIVE

## 2019-08-18 LAB — CULTURE, BLOOD (ROUTINE X 2)
Culture: NO GROWTH
Culture: NO GROWTH
Special Requests: ADEQUATE
Special Requests: ADEQUATE

## 2019-08-18 LAB — PHOSPHORUS: Phosphorus: 2.6 mg/dL (ref 2.5–4.6)

## 2019-08-18 LAB — GLUCOSE, CAPILLARY
Glucose-Capillary: 122 mg/dL — ABNORMAL HIGH (ref 70–99)
Glucose-Capillary: 124 mg/dL — ABNORMAL HIGH (ref 70–99)
Glucose-Capillary: 127 mg/dL — ABNORMAL HIGH (ref 70–99)
Glucose-Capillary: 136 mg/dL — ABNORMAL HIGH (ref 70–99)

## 2019-08-18 LAB — HEPATITIS B SURFACE ANTIGEN: Hepatitis B Surface Ag: NONREACTIVE

## 2019-08-18 LAB — HEPATITIS B CORE ANTIBODY, TOTAL: Hep B Core Total Ab: NONREACTIVE

## 2019-08-18 LAB — SEDIMENTATION RATE: Sed Rate: 66 mm/hr — ABNORMAL HIGH (ref 0–16)

## 2019-08-18 LAB — VITAMIN D 25 HYDROXY (VIT D DEFICIENCY, FRACTURES): Vit D, 25-Hydroxy: 21.54 ng/mL — ABNORMAL LOW (ref 30–100)

## 2019-08-18 LAB — MAGNESIUM: Magnesium: 1.8 mg/dL (ref 1.7–2.4)

## 2019-08-18 MED ORDER — POTASSIUM CHLORIDE 10 MEQ/50ML IV SOLN
10.0000 meq | INTRAVENOUS | Status: AC
  Administered 2019-08-18 (×3): 10 meq via INTRAVENOUS
  Filled 2019-08-18 (×3): qty 50

## 2019-08-18 MED ORDER — TRAVASOL 10 % IV SOLN
INTRAVENOUS | Status: AC
  Filled 2019-08-18: qty 1209.6

## 2019-08-18 NOTE — Progress Notes (Signed)
PHARMACY - TOTAL PARENTERAL NUTRITION CONSULT NOTE   Indication: severe malnutrition  Patient Measurements: Height: 5' 9"  (175.3 cm) Weight: 92.5 kg (203 lb 14.8 oz) IBW/kg (Calculated) : 70.7 TPN AdjBW (KG): 76.2 Body mass index is 30.11 kg/m.  Assessment: 38 yom with PMH UC, portal vein thrombosis, and laparoscopic converted to open cholecystectomy presenting to [facility] from [facility] for UC with L intra-abdominal fluid collection, suspected secondary to descending colonic perforation. Patient acute worsening of nausea and bilious emesis x 1 mo, non-bloody diarrhea 3-4x daily, and hematuria. Also reports poor PO intake over the last month. Patient reports wanting to eat but GI feels TPN appropriate for now to enhance ability to heal, allowing CLD for now pre-op.  Glucose / Insulin: No hx DM. Steroids d/c'd 5/26. CBGs controlled on TPN. 6 units regular insulin in last 24 hours Electrolytes: Na 132, K 3.7, Mag 1.8, Phos 2.6 Renal: SCr stable wnl, BUN wnl. LFTs / TGs: LFTs trend down - AST now wnl, ALT 47, Tbili wnl  Prealbumin / albumin: prealbumin low 7.3>11.9, albumin 1.6 Intake / Output; MIVF: LBM 5/28, Urine output not recorded, Dran 105 mls out GI Imaging: 5/24 CT abd pelvis - L intra-abdominal gas/fluid collection, ?septic thrombophlebitis, ?colonic perforation Surgeries / Procedures: 5/27 - IR for drain placement  Central access: PICC placed 08/16/19 TPN start date: 08/16/19  Nutritional Goals (per RD recommendation on 5/26): kCal: 2000-2200, Protein: 110-125g, Fluid: >2L Goal TPN rate is 90 mL/hr (provides 121 g of protein and 2138 kcals per day)  Current Nutrition:  NPO  Plan:  Continue TPN rate at 90 mL/hr at 1800. -This TPN will provide 121g protein, 302g CHO, and 62.6g SMOF lipids, for total 2138 kCal, meeting 49% of protein and 48% of kCal needs. Electrolytes in TPN: standard - 22mq/L of Na, 60 mEq/L of K, 522m/L of Ca, 63m56mL of Mg, and 18 mmol/L of Phos. Cl:Ac ratio 1:1 Add  standard MVI and trace elements to TPN  Add folic acid 1mg41mr MD to TPN Initiate Moderate q6h SSI and adjust as needed  Monitor TPN labs on Mon/Thurs and tomorrow, GI/Surgery plans  CathAlanda SlimarmD, FCCMEye [facility]sburgnical Pharmacist Please see AMION for all Pharmacists' Contact Phone Numbers 08/18/2019, 7:12 AM

## 2019-08-18 NOTE — Progress Notes (Signed)
Patient ID: Brett Bright, male   DOB: 12/16/1960, 59 y.o.   MRN: [id number] [facility] Surgery Progress Note:   * No surgery found *  Subjective: Mental status is clear;  Sitting up in a chair.  Complaints he had a precipitous BM yesterday that "came from no where". Objective: Vital signs in last 24 hours: Temp:  [98 F (36.7 C)-98.5 F (36.9 C)] 98.5 F (36.9 C) (05/29 0538) Pulse Rate:  [103-106] 105 (05/29 0538) Resp:  [16-19] 19 (05/29 0538) BP: (104-113)/(65-75) 113/68 (05/29 0538) SpO2:  [96 %-97 %] 96 % (05/29 0538)  Intake/Output from previous day: 05/28 0701 - 05/29 0700 In: 2493.5 [P.O.:1120; I.V.:973.5; IV Piggyback:400] Out: 70 [Drains:70] Intake/Output this shift: No intake/output data recorded.  Physical Exam: Work of breathing is not labored.  Drain in place  Lab Results:  Results for orders placed or performed during the hospital encounter of 08/13/19 (from the past 48 hour(s))  Aerobic/Anaerobic Culture (surgical/deep wound)     Status: None (Preliminary result)   Collection Time: 08/16/19  2:27 PM   Specimen: Abscess  Result Value Ref Range   Specimen Description ABSCESS    Special Requests NONE    Gram Stain      ABUNDANT WBC PRESENT, PREDOMINANTLY PMN ABUNDANT GRAM NEGATIVE RODS ABUNDANT GRAM POSITIVE COCCI IN PAIRS IN CHAINS MODERATE GRAM POSITIVE RODS    Culture      CULTURE REINCUBATED FOR BETTER GROWTH Performed at [facility], [place] [address] [place], [place] [address]    Report Status PENDING   Glucose, capillary     Status: Abnormal   Collection Time: 08/17/19 12:09 AM  Result Value Ref Range   Glucose-Capillary 163 (H) 70 - 99 mg/dL    Comment: Glucose reference range applies only to samples taken after fasting for at least 8 hours.  Comprehensive metabolic panel     Status: Abnormal   Collection Time: 08/17/19  4:34 AM  Result Value Ref Range   Sodium 132 (L) 135 - 145 mmol/L   Potassium 3.6 3.5 - 5.1 mmol/L   Chloride  101 98 - 111 mmol/L   CO2 22 22 - 32 mmol/L   Glucose, Bld 121 (H) 70 - 99 mg/dL    Comment: Glucose reference range applies only to samples taken after fasting for at least 8 hours.   BUN 7 6 - 20 mg/dL   Creatinine, Ser 0.69 0.61 - 1.24 mg/dL   Calcium 7.6 (L) 8.9 - 10.3 mg/dL   Total Protein 5.7 (L) 6.5 - 8.1 g/dL   Albumin 1.6 (L) 3.5 - 5.0 g/dL   AST 25 15 - 41 U/L   ALT 47 (H) 0 - 44 U/L   Alkaline Phosphatase 48 38 - 126 U/L   Total Bilirubin 0.4 0.3 - 1.2 mg/dL   GFR calc non Af Amer >60 >60 mL/min   GFR calc Af Amer >60 >60 mL/min   Anion gap 9 5 - 15    Comment: Performed at [facility] [address], [place], [place] [address]  Prealbumin     Status: Abnormal   Collection Time: 08/17/19  4:34 AM  Result Value Ref Range   Prealbumin 11.9 (L) 18 - 38 mg/dL    Comment: Performed at [facility] [address], [place], [place] [address]  Magnesium     Status: None   Collection Time: 08/17/19  4:34 AM  Result Value Ref Range   Magnesium 1.7 1.7 -  2.4 mg/dL    Comment: Performed at [facility], [place] [address], [place], [place] [address]  Phosphorus     Status: None   Collection Time: 08/17/19  4:34 AM  Result Value Ref Range   Phosphorus 3.6 2.5 - 4.6 mg/dL    Comment: Performed at [facility] [address], [place], [place] [address]  CBC     Status: Abnormal   Collection Time: 08/17/19  4:34 AM  Result Value Ref Range   WBC 6.9 4.0 - 10.5 K/uL   RBC 3.87 (L) 4.22 - 5.81 MIL/uL   Hemoglobin 10.4 (L) 13.0 - 17.0 g/dL   HCT 33.2 (L) 39.0 - 52.0 %   MCV 85.8 80.0 - 100.0 fL   MCH 26.9 26.0 - 34.0 pg   MCHC 31.3 30.0 - 36.0 g/dL   RDW 12.9 11.5 - 15.5 %   Platelets 311 150 - 400 K/uL   nRBC 0.0 0.0 - 0.2 %    Comment: Performed at [facility], [place] [address] [place], [place] [address]  Differential     Status: Abnormal   Collection Time: 08/17/19  4:34 AM  Result Value Ref Range   Neutrophils Relative % 87 %   Neutro Abs 6.0  1.7 - 7.7 K/uL   Lymphocytes Relative 8 %   Lymphs Abs 0.6 (L) 0.7 - 4.0 K/uL   Monocytes Relative 4 %   Monocytes Absolute 0.3 0.1 - 1.0 K/uL   Eosinophils Relative 0 %   Eosinophils Absolute 0.0 0.0 - 0.5 K/uL   Basophils Relative 0 %   Basophils Absolute 0.0 0.0 - 0.1 K/uL   Immature Granulocytes 1 %   Abs Immature Granulocytes 0.05 0.00 - 0.07 K/uL    Comment: Performed at [facility] [address] [place], [place] [address]  Triglycerides     Status: None   Collection Time: 08/17/19  4:35 AM  Result Value Ref Range   Triglycerides 58 <150 mg/dL    Comment: Performed at [facility] [address], [place], [place] [address]  Glucose, capillary     Status: Abnormal   Collection Time: 08/17/19  4:51 AM  Result Value Ref Range   Glucose-Capillary 119 (H) 70 - 99 mg/dL    Comment: Glucose reference range applies only to samples taken after fasting for at least 8 hours.  Glucose, capillary     Status: Abnormal   Collection Time: 08/17/19 11:49 AM  Result Value Ref Range   Glucose-Capillary 135 (H) 70 - 99 mg/dL    Comment: Glucose reference range applies only to samples taken after fasting for at least 8 hours.  Glucose, capillary     Status: Abnormal   Collection Time: 08/17/19  6:28 PM  Result Value Ref Range   Glucose-Capillary 123 (H) 70 - 99 mg/dL    Comment: Glucose reference range applies only to samples taken after fasting for at least 8 hours.  Glucose, capillary     Status: Abnormal   Collection Time: 08/17/19  9:34 PM  Result Value Ref Range   Glucose-Capillary 115 (H) 70 - 99 mg/dL    Comment: Glucose reference range applies only to samples taken after fasting for at least 8 hours.  Basic metabolic panel     Status: Abnormal   Collection Time: 08/18/19  4:34 AM  Result Value Ref Range   Sodium 132 (L) 135 - 145 mmol/L   Potassium 3.7 3.5 - 5.1  mmol/L   Chloride 101 98 - 111 mmol/L   CO2 23 22 - 32 mmol/L   Glucose, Bld 120 (H) 70 - 99 mg/dL     Comment: Glucose reference range applies only to samples taken after fasting for at least 8 hours.   BUN 7 6 - 20 mg/dL   Creatinine, Ser 0.59 (L) 0.61 - 1.24 mg/dL   Calcium 7.7 (L) 8.9 - 10.3 mg/dL   GFR calc non Af Amer >60 >60 mL/min   GFR calc Af Amer >60 >60 mL/min   Anion gap 8 5 - 15    Comment: Performed at [facility] [address], [place], [place] [address]  Magnesium     Status: None   Collection Time: 08/18/19  4:34 AM  Result Value Ref Range   Magnesium 1.8 1.7 - 2.4 mg/dL    Comment: Performed at [facility] [address], [place], [place] [address]  Phosphorus     Status: None   Collection Time: 08/18/19  4:34 AM  Result Value Ref Range   Phosphorus 2.6 2.5 - 4.6 mg/dL    Comment: Performed at [facility] [address] [place], [place] [address]  Sedimentation rate     Status: Abnormal   Collection Time: 08/18/19  4:34 AM  Result Value Ref Range   Sed Rate 66 (H) 0 - 16 mm/hr    Comment: Performed at [facility] [address] [place], [place] [address]  C-reactive protein     Status: Abnormal   Collection Time: 08/18/19  4:34 AM  Result Value Ref Range   CRP 13.6 (H) <1.0 mg/dL    Comment: Performed at [facility] [address] [place], [place] [address]  Glucose, capillary     Status: Abnormal   Collection Time: 08/18/19  5:35 AM  Result Value Ref Range   Glucose-Capillary 136 (H) 70 - 99 mg/dL    Comment: Glucose reference range applies only to samples taken after fasting for at least 8 hours.    Radiology/Results: CT IMAGE GUIDED DRAINAGE BY PERCUTANEOUS CATHETER  Result Date: 08/16/2019 INDICATION: 59 year old male with a history of diverticular abscess EXAM: CT GUIDED DRAINAGE OF LEFT ABDOMINAL ABSCESS MEDICATIONS: The patient is currently admitted to the hospital and receiving intravenous antibiotics. The antibiotics were administered within an appropriate time frame prior to the initiation of the procedure.  ANESTHESIA/SEDATION: 1.0 mg IV Versed 50 mcg IV Fentanyl Moderate Sedation Time:  45 minutes The patient was continuously monitored during the procedure by the interventional radiology nurse under my direct supervision. COMPLICATIONS: None TECHNIQUE: Informed written consent was obtained from the patient after a thorough discussion of the procedural risks, benefits and alternatives. All questions were addressed. Maximal Sterile Barrier Technique was utilized including caps, mask, sterile gowns, sterile gloves, sterile drape, hand hygiene and skin antiseptic. A timeout was performed prior to the initiation of the procedure. PROCEDURE: The left abdomen was prepped with chlorhexidine in a sterile fashion, and a sterile drape was applied covering the operative field. A sterile gown and sterile gloves were used for the procedure. Local anesthesia was provided with 1% Lidocaine. Using CT guidance, trocar needle was advanced into the abscess from an anterior approach. Modified Seldinger technique was then used to place a 10 [place] drain into the abscess. Approximately 35 cc of purulent material aspirated. Drain was attached to bulb suction. Drain was sutured in position and a final image was stored. Patient  tolerated the procedure well and remained hemodynamically stable throughout. No complications were encountered and no significant blood loss. FINDINGS: CT of the abdomen demonstrates persisting abscess of the left abdomen, compatible with diverticular abscess. CT scan continuing through the pelvis demonstrates significant reduction in the small fluid collection in the left perirectal space. We elected not to proceed with attempted drainage of this small residual inflammatory change. IMPRESSION: Status post CT-guided drainage of left abdominal abscess. Signed, Dulcy Fanny. Dellia Nims, RPVI [facility] Radiology Electronically Signed   By: Corrie Mckusick D.O.   On: 08/16/2019  15:42    Anti-infectives: Anti-infectives (From admission, onward)   Start     Dose/Rate Route Frequency Ordered Stop   08/13/19 2300  piperacillin-tazobactam (ZOSYN) IVPB 3.375 g     3.375 g 100 mL/hr over 30 Minutes Intravenous Every 8 hours 08/13/19 2149     08/13/19 0930  piperacillin-tazobactam (ZOSYN) IVPB 3.375 g     3.375 g 100 mL/hr over 30 Minutes Intravenous  Once 08/13/19 1194 08/13/19 1638      Assessment/Plan: Problem List: Patient Active Problem List   Diagnosis Date Noted  . Dehydration   . Colon perforation ([place]) 08/14/2019  . Perforated viscus   . Septic thrombophlebitis   . Nausea with vomiting 07/17/2019  . Ulcerative pancolitis with rectal bleeding ([place])   . Iron deficiency anemia due to chronic blood loss   . Acute GI bleeding 08/15/2018  . Symptomatic anemia 08/15/2018  . AKI (acute kidney injury) ([place]) 08/15/2018  . Thrombocytosis ([place]) 08/15/2018  . Epigastric pain   . Ulcerative colitis ([place])   . Nausea vomiting and diarrhea   . Sepsis due to undetermined organism ([place]) 03/22/2016  . Acute calculous cholecystitis 03/22/2016  . 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 Elevated LFTs 03/22/2016  . Hyponatremia 03/22/2016  . Hypokalemia 03/22/2016  . Acute blood loss anemia 03/22/2016  . Abnormal serum level of lipase 03/22/2016  . 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 Hassell Done, MD, [facility] [phone] to reach the surgeon on call.    08/18/2019 8:44 AM

## 2019-08-18 NOTE — Progress Notes (Signed)
PROGRESS NOTE    Brett Bright  KGM:[id number] DOB: 10-30-1960 DOA: 08/13/2019 PCP: Patient, No Pcp Per   Chief Complaint  Patient presents with  . Emesis    Brief Narrative:  HPI per Dr. Abigail Miyamoto is a 59 y.o. male with medical history of Ulcerative colitis, upper GI bleed, portal vein thrombus, GERD presenting with over 1 week history of nausea, vomiting, and diarrhea.  Patient was recently admitted to the hospital from 07/19/2019 to 07/20/2019 with ulcerative colitis flare.  He was discharged home with a prednisone taper.  He ultimately followed up with his gastroenterologist on 08/01/2019.  He has Lialda was increased to 4.8 g daily, and he was instructed to continue on his prednisone taper.  The patient states that he has tapered himself down to 10 mg of prednisone.  He began his 10 mg dose on 08/10/2019.  However, the patient has continued to have worsening nausea, vomiting, and diarrhea in the past week.  He denies any hematemesis, hematochezia, melena, fevers, chills.  He states that he has never had any abdominal pain even with his initial diagnosis of ulcerative colitis.  He states that he does not have any abdominal pain at this point.  He denies any fevers, chills, headache, chest pain, shortness breath, coughing, hemoptysis, hematemesis, dysuria, hematuria.  He denies any new medications.  He does not drink any alcohol or take any NSAIDs. In the emergency department, the patient has been afebrile and hemodynamically stable with oxygen saturation 96-100% on room air.  WBC was initially 14.9 with hemoglobin 11.4, and platelets 275,000.  Initial sodium was 127 with serum creatinine 0.79.  CT of the abdomen and pelvis showed Gas and fluid in the LEFT retroperitoneum and extending into peritoneal spaces via mesenteric pathways in the LEFT abdomen, associated with interloop fluid and gas as well as loculated collections in the mesorectum.  Because of the extensive distribution,  septic thrombophlebitis was favored associated with local colonic perforation.  There was gas noted along the IMV at the site of the previous thrombus into the left retroperitoneum. Initial contact was made with [facility] whom had accepted the patient in transfer.  However the patient continued to wait in the emergency department for over 24 hours.  Subsequently, general surgery, Dr. Aviva Signs was contacted.  He recommended transfer to a higher level of care.  He ultimately spoke with general surgery, Dr. Jaymes Graff, at [facility] and the plan is to transfer the patient to [facility] for further surgical evaluation.  The patient was started on intravenous Zosyn and IV fluids.  Assessment & Plan:   Principal Problem:   Colon perforation ([place]) Active Problems:   Hyponatremia   Hypokalemia   Nausea vomiting and diarrhea   Ulcerative colitis (HCC)   Iron deficiency anemia due to chronic blood loss   Dehydration  1 concern for peritonitis secondary to perforated colon/intraabdominal fluid collection As noted on CT abdomen and pelvis.  Patient currently afebrile.  Normal white count.  Patient denies any abdominal pain.  Nausea vomiting diarrhea improving.  Patient initially seen by Dr. Arnoldo Morale, general surgery at [facility] who discussed case with general surgery at [facility], [place] and patient transferred to [facility] for further surgical evaluation.  Patient seen in consultation by general surgery this morning who feel patient is not toxic appearing and recommending management patient's ulcerative colitis.  Patient currently tolerating clear liquids.  Patient status post aspiration and drain placement of intra-abdominal fluid  collection by IR, 08/16/2019 with cultures pending.  IV Solu-Medrol discontinued per GI. Per general surgery no surgical needs at this time.  Patient started on TPN.  Continue empiric IV Zosyn.  GI recommending continuation of clear liquids for now with repeat  CT scan early next week and if improvement on CT scan then may be able to advance diet.  Diet advancement per GI.  GI and general surgery following and appreciate input and recommendations.   2.  Ulcerative colitis  Patient states some improvement with nausea and emesis.  Abdominal pain improved.  Loose stools improved.  CRP elevated at 10.1.  Sed rate elevated at 70.  Currently tolerating clear liquids.  Patient was on IV Solu-Medrol which was discontinued 08/15/2019 per GI.  Continue IV Zosyn. Patient seen and being followed by GI. Patient status post aspiration and drainage of intra-abdominal fluid collection 08/16/2019 per Dr. Earleen Newport with purulent fluid noted in drain.  Intra-abdominal fluid cultures pending.  Patient on TPN per GI recommendations. GI following and I appreciate the input and recommendations.  3.  Hyponatremia Likely secondary to hypovolemic hyponatremia.  Improved with hydration.  Follow.    4.  Hypokalemia Repleted.  Potassium at 3.7.  Magnesium at 1.8.  Electrolytes being repleted per pharmacy via TPN.  Follow.    5.  Iron deficiency anemia anemia, unknown/folate deficiency. Likely secondary to problem #2.  Anemia panel consistent with anemia of chronic disease.  Folic acid level of 5.1.  Continue folate supplementation.  6.  Severe protein calorie malnutrition TPN initiated.  Tolerating clear liquids.  Diet advancement per GI.  Follow.     DVT prophylaxis: SCDs Code Status full Family Communication: Updated patient.  No family at bedside. Disposition:   Status is: Inpatient    Dispo: The patient is from: Home              Anticipated d/c is to: Home              Anticipated d/c date is: To be determined.              Patient currently on IV antibiotics, status post CT-guided drain placement to abdominal abscess, being followed by general surgery and GI due to ulcerative colitis and concern for peritonitis.         Consultants:   General surgery: Dr.  Olena Leatherwood [facility] 08/13/2019  General surgery: Dr. Nyoka Cowden [facility] 08/15/2019  Gastroenterology: Dr. Rush Landmark 08/15/2019  Interventional radiology: Dr. Earleen Newport  Procedures:   CT abdomen and pelvis 08/13/2019  Drainage of left abdominal abscess with 10 French drain per IR/Dr. Earleen Newport 08/16/2019  Antimicrobials:   IV Zosyn 08/13/2019   Subjective: Patient sitting up in chair tolerating clear liquids.  Denies any chest pain.  No shortness of breath.  States some abdominal discomfort around drain site.  States had a sudden significant bowel movement yesterday where he could not even get up to make it to the bathroom.   Objective: Vitals:   08/17/19 0448 08/17/19 1448 08/17/19 2132 08/18/19 0538  BP: 101/75 104/65 106/75 113/68  Pulse: (!) 106 (!) 106 (!) 103 (!) 105  Resp: 17 16 17 19   Temp: 98.8 F (37.1 C) 98 F (36.7 C) 98.2 F (36.8 C) 98.5 F (36.9 C)  TempSrc:  Oral Oral Oral  SpO2: 96% 97% 97% 96%  Weight:      Height:        Intake/Output Summary (Last 24 hours) at 08/18/2019 1027 Last data  filed at 08/18/2019 0553 Gross per 24 hour  Intake 2253.48 ml  Output 70 ml  Net 2183.48 ml   Filed Weights   08/13/19 0549 08/14/19 1959  Weight: 92.5 kg 92.5 kg    Examination:  General exam: NAD Respiratory system: CTAB.  No wheezes, no crackles, no rhonchi.  Normal respiratory effort.  Cardiovascular system: Rate rhythm no murmurs rubs or gallops.  No JVD.  No lower extremity edema. Gastrointestinal system: Abdomen is soft, nontender, nondistended, positive bowel sounds.  No rebound.  No guarding.  Drain with purulent drainage in bulb.  Nontender, nondistended, soft, positive bowel sounds.  No rebound.  No guarding.   Drain with purulent material noted in bulb. Central nervous system: Alert and oriented. No focal neurological deficits. Extremities: Symmetric 5 x 5 power. Skin: No rashes, lesions or ulcers Psychiatry: Judgement and insight appear  normal. Mood & affect appropriate.     Data Reviewed: I have personally reviewed following labs and imaging studies  CBC: Recent Labs  Lab 08/13/19 0609 08/14/19 0701 08/15/19 0230 08/16/19 0203 08/17/19 0434  WBC 14.9* 8.6 4.2 5.1 6.9  NEUTROABS 13.0*  --   --  3.7 6.0  HGB 11.4* 9.7* 10.0* 9.8* 10.4*  HCT 35.5* 31.2* 31.9* 31.4* 33.2*  MCV 83.9 86.9 85.3 85.3 85.8  PLT 312 275 238 269 409    Basic Metabolic Panel: Recent Labs  Lab 08/13/19 0609 08/13/19 0609 08/14/19 0701 08/15/19 0230 08/16/19 0203 08/17/19 0434 08/18/19 0434  NA 127*   < > 132* 134* 135 132* 132*  K 3.2*   < > 3.6 4.5 3.6 3.6 3.7  CL 93*   < > 98 100 104 101 101  CO2 23   < > 23 23 24 22 23   GLUCOSE 121*   < > 100* 100* 113* 121* 120*  BUN 13   < > 13 11 8 7 7   CREATININE 0.78   < > 0.80 0.78 0.72 0.69 0.59*  CALCIUM 8.1*   < > 7.7* 7.9* 7.7* 7.6* 7.7*  MG 2.1  --   --   --  2.0 1.7 1.8  PHOS  --   --   --   --  3.2 3.6 2.6   < > = values in this interval not displayed.    GFR: Estimated Creatinine Clearance: 113 mL/min (A) (by C-G formula based on SCr of 0.59 mg/dL (L)).  Liver Function Tests: Recent Labs  Lab 08/13/19 0609 08/15/19 0230 08/16/19 0203 08/17/19 0434  AST 107* 38  --  25  ALT 143* 71*  --  47*  ALKPHOS 77 49  --  48  BILITOT 0.6 0.4  --  0.4  PROT 7.2 6.0*  --  5.7*  ALBUMIN 2.3* 1.5* 1.5* 1.6*    CBG: Recent Labs  Lab 08/17/19 0451 08/17/19 1149 08/17/19 1828 08/17/19 2134 08/18/19 0535  GLUCAP 119* 135* 123* 115* 136*     Recent Results (from the past 240 hour(s))  Culture, blood (routine x 2)     Status: None   Collection Time: 08/13/19 10:03 AM   Specimen: BLOOD RIGHT HAND  Result Value Ref Range Status   Specimen Description BLOOD RIGHT HAND  Final   Special Requests   Final    BOTTLES DRAWN AEROBIC AND ANAEROBIC Blood Culture adequate volume   Culture   Final    NO GROWTH 5 DAYS Performed at [facility], [address], [place], [place]  [address]    Report  Status 08/18/2019 FINAL  Final  Culture, blood (routine x 2)     Status: None   Collection Time: 08/13/19 10:08 AM   Specimen: BLOOD LEFT HAND  Result Value Ref Range Status   Specimen Description BLOOD LEFT HAND  Final   Special Requests   Final    BOTTLES DRAWN AEROBIC AND ANAEROBIC Blood Culture adequate volume   Culture   Final    NO GROWTH 5 DAYS Performed at [facility], [address], [place], [place] [address]    Report Status 08/18/2019 FINAL  Final  SARS Coronavirus 2 by RT PCR (hospital order, performed in [facility] hospital lab) Nasopharyngeal Nasopharyngeal Swab     Status: None   Collection Time: 08/13/19 11:33 AM   Specimen: Nasopharyngeal Swab  Result Value Ref Range Status   SARS Coronavirus 2 NEGATIVE NEGATIVE Final    Comment: (NOTE) SARS-CoV-2 target nucleic acids are NOT DETECTED. The SARS-CoV-2 RNA is generally detectable in upper and lower respiratory specimens during the acute phase of infection. The lowest concentration of SARS-CoV-2 viral copies this assay can detect is 250 copies / mL. A negative result does not preclude SARS-CoV-2 infection and should not be used as the sole basis for treatment or other patient management decisions.  A negative result may occur with improper specimen collection / handling, submission of specimen other than nasopharyngeal swab, presence of viral mutation(s) within the areas targeted by this assay, and inadequate number of viral copies (<250 copies / mL). A negative result must be combined with clinical observations, patient history, and epidemiological information. Fact Sheet for Patients:   StrictlyIdeas.no Fact Sheet for Healthcare Providers: BankingDealers.co.za This test is not yet approved or cleared  by the [place] FDA and has been authorized for detection and/or diagnosis of SARS-CoV-2 by FDA under an Emergency Use Authorization (EUA).  This  EUA will remain in effect (meaning this test can be used) for the duration of the COVID-19 declaration under Section 564(b)(1) of the Act, 21 U.S.C. section 360bbb-3(b)(1), unless the authorization is terminated or revoked sooner. Performed at [facility], [address], [place], [place] [address]   Aerobic/Anaerobic Culture (surgical/deep wound)     Status: None (Preliminary result)   Collection Time: 08/16/19  2:27 PM   Specimen: Abscess  Result Value Ref Range Status   Specimen Description ABSCESS  Final   Special Requests NONE  Final   Gram Stain   Final    ABUNDANT WBC PRESENT, PREDOMINANTLY PMN ABUNDANT GRAM NEGATIVE RODS ABUNDANT GRAM POSITIVE COCCI IN PAIRS IN CHAINS MODERATE GRAM POSITIVE RODS    Culture   Final    CULTURE REINCUBATED FOR BETTER GROWTH Performed at [facility], [place] [address] [place], [place] [address]    Report Status PENDING  Incomplete         Radiology Studies: CT IMAGE GUIDED DRAINAGE BY PERCUTANEOUS CATHETER  Result Date: 08/16/2019 INDICATION: 59 year old male with a history of diverticular abscess EXAM: CT GUIDED DRAINAGE OF LEFT ABDOMINAL ABSCESS MEDICATIONS: The patient is currently admitted to the hospital and receiving intravenous antibiotics. The antibiotics were administered within an appropriate time frame prior to the initiation of the procedure. ANESTHESIA/SEDATION: 1.0 mg IV Versed 50 mcg IV Fentanyl Moderate Sedation Time:  45 minutes The patient was continuously monitored during the procedure by the interventional radiology nurse under my direct supervision. COMPLICATIONS: None TECHNIQUE: Informed written consent was obtained from the patient after a thorough discussion of the procedural risks, benefits and alternatives.  All questions were addressed. Maximal Sterile Barrier Technique was utilized including caps, mask, sterile gowns, sterile gloves, sterile drape, hand hygiene and skin antiseptic. A timeout was performed prior to  the initiation of the procedure. PROCEDURE: The left abdomen was prepped with chlorhexidine in a sterile fashion, and a sterile drape was applied covering the operative field. A sterile gown and sterile gloves were used for the procedure. Local anesthesia was provided with 1% Lidocaine. Using CT guidance, trocar needle was advanced into the abscess from an anterior approach. Modified Seldinger technique was then used to place a 10 [place] drain into the abscess. Approximately 35 cc of purulent material aspirated. Drain was attached to bulb suction. Drain was sutured in position and a final image was stored. Patient tolerated the procedure well and remained hemodynamically stable throughout. No complications were encountered and no significant blood loss. FINDINGS: CT of the abdomen demonstrates persisting abscess of the left abdomen, compatible with diverticular abscess. CT scan continuing through the pelvis demonstrates significant reduction in the small fluid collection in the left perirectal space. We elected not to proceed with attempted drainage of this small residual inflammatory change. IMPRESSION: Status post CT-guided drainage of left abdominal abscess. Signed, Dulcy Fanny. Dellia Nims, RPVI [facility] Radiology Electronically Signed   By: Corrie Mckusick D.O.   On: 08/16/2019 15:42        Scheduled Meds: . Chlorhexidine Gluconate Cloth  6 each Topical Daily  . enoxaparin (LOVENOX) injection  40 mg Subcutaneous Daily  . feeding supplement  1 Container Oral TID BM  . insulin aspart  0-15 Units Subcutaneous Q6H  . sodium chloride flush  10-40 mL Intracatheter Q12H  . sodium chloride flush  5 mL Intracatheter Q8H   Continuous Infusions: . piperacillin-tazobactam 3.375 g (08/18/19 0500)  . potassium chloride 10 mEq (08/18/19 0923)  . TPN ADULT (ION) 90 mL/hr at 08/18/19 0423  . TPN ADULT (ION)       LOS: 4 days    Time spent: 35  minutes    Irine Seal, MD [facility]   To contact the attending provider between 7A-7P or the covering provider during after hours 7P-7A, please log into the web site [email or web address] and access using universal [place] password for that web site. If you do not have the password, please call the hospital operator.  08/18/2019, 10:27 AM

## 2019-08-19 LAB — BASIC METABOLIC PANEL
Anion gap: 5 (ref 5–15)
BUN: 7 mg/dL (ref 6–20)
CO2: 25 mmol/L (ref 22–32)
Calcium: 7.8 mg/dL — ABNORMAL LOW (ref 8.9–10.3)
Chloride: 103 mmol/L (ref 98–111)
Creatinine, Ser: 0.55 mg/dL — ABNORMAL LOW (ref 0.61–1.24)
GFR calc Af Amer: >60 mL/min (ref >60)
GFR calc non Af Amer: >60 mL/min (ref >60)
Glucose, Bld: 131 mg/dL — ABNORMAL HIGH (ref 70–99)
Potassium: 4 mmol/L (ref 3.5–5.1)
Sodium: 133 mmol/L — ABNORMAL LOW (ref 135–145)

## 2019-08-19 LAB — GLUCOSE, CAPILLARY
Glucose-Capillary: 110 mg/dL — ABNORMAL HIGH (ref 70–99)
Glucose-Capillary: 131 mg/dL — ABNORMAL HIGH (ref 70–99)
Glucose-Capillary: 137 mg/dL — ABNORMAL HIGH (ref 70–99)
Glucose-Capillary: 141 mg/dL — ABNORMAL HIGH (ref 70–99)

## 2019-08-19 LAB — VARICELLA ZOSTER ANTIBODY, IGG: Varicella IgG: 1508 index (ref >165)

## 2019-08-19 LAB — MAGNESIUM: Magnesium: 2.1 mg/dL (ref 1.7–2.4)

## 2019-08-19 LAB — PHOSPHORUS: Phosphorus: 2.8 mg/dL (ref 2.5–4.6)

## 2019-08-19 MED ORDER — TRAVASOL 10 % IV SOLN
INTRAVENOUS | Status: AC
  Filled 2019-08-19: qty 1209.6

## 2019-08-19 MED ORDER — PIPERACILLIN-TAZOBACTAM 3.375 G IVPB
3.3750 g | Freq: Three times a day (TID) | INTRAVENOUS | Status: DC
  Administered 2019-08-19 – 2019-08-23 (×11): 3.375 g via INTRAVENOUS
  Filled 2019-08-19 (×12): qty 50

## 2019-08-19 NOTE — Progress Notes (Signed)
    Progress Note   Subjective  Chief Complaint: Intra-abdominal, retroperitoneal fluid collection abscess with a history of UC  Today, the patient tells me that he is feeling better, his drain output has decreased.  He is having bowel movements but they are "nothing the right home about", still loose.  Denies abdominal pain.  Tolerated his clear liquid diet and tells me that they are moving up to a full liquid diet today.   Objective   Vital signs in last 24 hours: Temp:  [98.3 F (36.8 C)-98.7 F (37.1 C)] 98.3 F (36.8 C) (05/30 0449) Pulse Rate:  [98-109] 98 (05/30 0449) Resp:  [17-20] 18 (05/30 0449) BP: (104-113)/(66-73) 106/66 (05/30 0449) SpO2:  [96 %-97 %] 97 % (05/30 0449) Last BM Date: 08/19/19 General:    white male in NAD Heart:  Regular rate and rhythm; no murmurs Lungs: Respirations even and unlabored, lungs CTA bilaterally Abdomen:  Soft, nontender and nondistended. Normal bowel sounds.+perc drain in place with drainage of white milky liquid 50 cc Extremities:  Without edema. Neurologic:  Alert and oriented,  grossly normal neurologically. Psych:  Cooperative. Normal mood and affect.  Intake/Output from previous day: 05/29 0701 - 05/30 0700 In: 64 [P.O.:920] Out: 40 [Drains:40]  Lab Results: Recent Labs    08/17/19 0434  WBC 6.9  HGB 10.4*  HCT 33.2*  PLT 311   BMET Recent Labs    08/17/19 0434 08/18/19 0434 08/19/19 0427  NA 132* 132* 133*  K 3.6 3.7 4.0  CL 101 101 103  CO2 22 23 25   GLUCOSE 121* 120* 131*  BUN 7 7 7   CREATININE 0.69 0.59* 0.55*  CALCIUM 7.6* 7.7* 7.8*   LFT Recent Labs    08/17/19 0434  PROT 5.7*  ALBUMIN 1.6*  AST 25  ALT 47*  ALKPHOS 48  BILITOT 0.4    Assessment / Plan:   Assessment: 1.  Ulcerative colitis (though current situation raises question of Crohn's) with complications of perforation, intra-abdominal and retroperitoneal abscesses/fluid collections: Drain in place 2.  Protein malnutrition: TPN since  5/27 3.  IMV thrombosis, prior portal thrombosis: SQ Lovenox in place 4.  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  IDA  Plan: 1.  Continue supportive care 2.  Agree with increase in diet to full liquids 3.  Continue SQ Lovenox 4.  Repeat Ct AP ordered for tomorrow  5.  Please await any further recommendations from Dr. Rush Landmark.  Thank you for kind consultation, we will continue to follow.    LOS: 5 days   Levin Erp  08/19/2019, 11:23 AM

## 2019-08-19 NOTE — Progress Notes (Signed)
PROGRESS NOTE    Brett Bright  AYT:[id number] DOB: Jun 08, 1960 DOA: 08/13/2019 PCP: Brett Bright, No Pcp Per   Chief Complaint  Brett Bright presents with  . Emesis    Brief Narrative:  HPI per Brett Bright is a 59 y.o. male with medical history of Ulcerative colitis, upper GI bleed, portal vein thrombus, GERD presenting with over 1 week history of nausea, vomiting, and diarrhea.  Brett Bright was recently admitted to the hospital from 07/19/2019 to 07/20/2019 with ulcerative colitis flare.  He was discharged home with a prednisone taper.  He ultimately followed up with his gastroenterologist on 08/01/2019.  He has Lialda was increased to 4.8 g daily, and he was instructed to continue on his prednisone taper.  The Brett Bright states that he has tapered himself down to 10 mg of prednisone.  He began his 10 mg dose on 08/10/2019.  However, the Brett Bright has continued to have worsening nausea, vomiting, and diarrhea in the past week.  He denies any hematemesis, hematochezia, melena, fevers, chills.  He states that he has never had any abdominal pain even with his initial diagnosis of ulcerative colitis.  He states that he does not have any abdominal pain at this point.  He denies any fevers, chills, headache, chest pain, shortness breath, coughing, hemoptysis, hematemesis, dysuria, hematuria.  He denies any new medications.  He does not drink any alcohol or take any NSAIDs. In the emergency department, the Brett Bright has been afebrile and hemodynamically stable with oxygen saturation 96-100% on room air.  WBC was initially 14.9 with hemoglobin 11.4, and platelets 275,000.  Initial sodium was 127 with serum creatinine 0.79.  CT of the abdomen and pelvis showed Gas and fluid in the LEFT retroperitoneum and extending into peritoneal spaces via mesenteric pathways in the LEFT abdomen, associated with interloop fluid and gas as well as loculated collections in the mesorectum.  Because of the extensive distribution,  septic thrombophlebitis was favored associated with local colonic perforation.  There was gas noted along the IMV at the site of the previous thrombus into the left retroperitoneum. Initial contact was made with Brett Bright whom had accepted the Brett Bright in transfer.  However the Brett Bright continued to wait in the emergency department for over 24 hours.  Subsequently, general surgery, Brett Bright was contacted.  He recommended transfer to a higher level of care.  He ultimately spoke with general surgery, Brett Bright, at [facility] and the plan is to transfer the Brett Bright to Brett Bright for further surgical evaluation.  The Brett Bright was started on intravenous Zosyn and IV fluids.  Assessment & Plan:   Principal Problem:   Colon perforation ([place]) Active Problems:   Hyponatremia   Hypokalemia   Nausea vomiting and diarrhea   Ulcerative colitis (HCC)   Iron deficiency anemia due to chronic blood loss   Dehydration  1 concern for peritonitis secondary to perforated colon/intraabdominal fluid collection As noted on CT abdomen and pelvis.  Brett Bright currently afebrile.  Normal white count.  Brett Bright denies any abdominal pain.  Nausea vomiting diarrhea improving.  Brett Bright initially seen by Brett Bright, general surgery at [facility] who discussed case with general surgery at [facility] [place] and Brett Bright transferred to [facility] for further surgical evaluation.  Brett Bright seen in consultation by general surgery this morning who feel Brett Bright is not toxic appearing and recommending management Brett Bright's ulcerative colitis.  Brett Bright currently tolerating clear liquids.  Brett Bright status post aspiration and drain placement of intra-abdominal fluid  collection by IR, 08/16/2019 with cultures pending.  IV Solu-Medrol discontinued per GI. Per general surgery no surgical needs at this time.  Brett Bright started on TPN.  Continue empiric IV Zosyn.  Diet has been advanced to full liquid diet.  Repeat CT abdomen  and pelvis ordered for tomorrow per GI. GI and general surgery following and appreciate input and recommendations.   2.  Ulcerative colitis  Brett Bright states some improvement with nausea and emesis.  Abdominal pain improved.  Loose stools improved.  CRP elevated at 10.1.  Sed rate elevated at 70.  Currently tolerating clear liquids.  Brett Bright was on IV Solu-Medrol which was discontinued 08/15/2019 per GI.  Continue IV Zosyn. Brett Bright seen and being followed by GI. Brett Bright status post aspiration and drainage of intra-abdominal fluid collection 08/16/2019 per Brett Bright with purulent fluid noted in drain.  Intra-abdominal fluid cultures pending.  Brett Bright on TPN per GI recommendations. GI following and I appreciate the input and recommendations.  3.  Hyponatremia Likely secondary to hypovolemic hyponatremia.  Improved with hydration.  Follow.    4.  Hypokalemia Repleted.  Potassium at 4.  Magnesium at 2.1.  Electrolytes being repleted per pharmacy via TPN.  Follow.    5.  Iron deficiency anemia anemia, unknown/folate deficiency. Likely secondary to problem #2.  Anemia panel consistent with anemia of chronic disease.  Folic acid level of 5.1.  Continue folate supplementation.  6.  Severe protein calorie malnutrition Continue TPN.  Diet has been advanced to full liquid diet.  Follow.     DVT prophylaxis: SCDs Code Status full Family Communication: Updated Brett Bright.  No family at bedside. Disposition:   Status is: Inpatient    Dispo: The Brett Bright is from: Home              Anticipated d/c is to: Home              Anticipated d/c date is: To be determined.              Brett Bright currently on IV antibiotics, status post CT-guided drain placement to abdominal abscess, being followed by general surgery and GI due to ulcerative colitis and concern for peritonitis.         Consultants:   General surgery: Brett Bright [facility] 08/13/2019  General surgery: Brett Bright [facility]  08/15/2019  Gastroenterology: Dr. Rush Landmark 08/15/2019  Interventional radiology: Brett Bright  Procedures:   CT abdomen and pelvis 08/13/2019  Drainage of left abdominal abscess with 10 French drain per IR/Brett Bright 08/16/2019  Antimicrobials:   IV Zosyn 08/13/2019   Subjective: Brett Bright sitting up in recliner.  Tolerating clear liquids.  Diet has been advanced to full liquids.  Denies any chest pain or shortness of breath.  Having some stools.  Denies any significant abdominal pain.   Objective: Vitals:   08/18/19 0538 08/18/19 1515 08/18/19 2025 08/19/19 0449  BP: 113/68 104/73 113/70 106/66  Pulse: (!) 105 (!) 109 (!) 107 98  Resp: 19 20 17 18   Temp: 98.5 F (36.9 C) 98.7 F (37.1 C) 98.6 F (37 C) 98.3 F (36.8 C)  TempSrc: Oral Oral Oral Oral  SpO2: 96% 96% 97% 97%  Weight:      Height:        Intake/Output Summary (Last 24 hours) at 08/19/2019 1216 Last data filed at 08/19/2019 0529 Gross per 24 hour  Intake 245 ml  Output 40 ml  Net 205 ml   Filed Weights   08/13/19 0549  08/14/19 1959  Weight: 92.5 kg 92.5 kg    Examination:  General exam: NAD Respiratory system: Lungs clear to auscultation bilaterally.  No wheezes, no crackles, no rhonchi.  Normal respiratory effort.  Cardiovascular system: RRR no murmurs rubs or gallops.  No JVD.  No lower extremity edema.  Gastrointestinal system: Abdomen is nontender, nondistended, soft, positive bowel sounds.  No rebound.  No guarding.  Drain with purulent drainage noted in bulb.  Central nervous system: Alert and oriented. No focal neurological deficits. Extremities: Symmetric 5 x 5 power. Skin: No rashes, lesions or ulcers Psychiatry: Judgement and insight appear normal. Mood & affect appropriate.     Data Reviewed: I have personally reviewed following labs and imaging studies  CBC: Recent Labs  Lab 08/13/19 0609 08/14/19 0701 08/15/19 0230 08/16/19 0203 08/17/19 0434  WBC 14.9* 8.6 4.2 5.1 6.9    NEUTROABS 13.0*  --   --  3.7 6.0  HGB 11.4* 9.7* 10.0* 9.8* 10.4*  HCT 35.5* 31.2* 31.9* 31.4* 33.2*  MCV 83.9 86.9 85.3 85.3 85.8  PLT 312 275 238 269 993    Basic Metabolic Panel: Recent Labs  Lab 08/13/19 0609 08/14/19 0701 08/15/19 0230 08/16/19 0203 08/17/19 0434 08/18/19 0434 08/19/19 0427  NA 127*   < > 134* 135 132* 132* 133*  K 3.2*   < > 4.5 3.6 3.6 3.7 4.0  CL 93*   < > 100 104 101 101 103  CO2 23   < > 23 24 22 23 25   GLUCOSE 121*   < > 100* 113* 121* 120* 131*  BUN 13   < > 11 8 7 7 7   CREATININE 0.78   < > 0.78 0.72 0.69 0.59* 0.55*  CALCIUM 8.1*   < > 7.9* 7.7* 7.6* 7.7* 7.8*  MG 2.1  --   --  2.0 1.7 1.8 2.1  PHOS  --   --   --  3.2 3.6 2.6 2.8   < > = values in this interval not displayed.    GFR: Estimated Creatinine Clearance: 113 mL/min (A) (by C-G formula based on SCr of 0.55 mg/dL (L)).  Liver Function Tests: Recent Labs  Lab 08/13/19 0609 08/15/19 0230 08/16/19 0203 08/17/19 0434  AST 107* 38  --  25  ALT 143* 71*  --  47*  ALKPHOS 77 49  --  48  BILITOT 0.6 0.4  --  0.4  PROT 7.2 6.0*  --  5.7*  ALBUMIN 2.3* 1.5* 1.5* 1.6*    CBG: Recent Labs  Lab 08/18/19 1153 08/18/19 1810 08/18/19 2351 08/19/19 0547 08/19/19 1215  GLUCAP 127* 124* 122* 131* 137*     Recent Results (from the past 240 hour(s))  Culture, blood (routine x 2)     Status: None   Collection Time: 08/13/19 10:03 AM   Specimen: BLOOD RIGHT HAND  Result Value Ref Range Status   Specimen Description BLOOD RIGHT HAND  Final   Special Requests   Final    BOTTLES DRAWN AEROBIC AND ANAEROBIC Blood Culture adequate volume   Culture   Final    NO GROWTH 5 DAYS Performed at [facility], [address] [place], [place] [address]    Report Status 08/18/2019 FINAL  Final  Culture, blood (routine x 2)     Status: None   Collection Time: 08/13/19 10:08 AM   Specimen: BLOOD LEFT HAND  Result Value Ref Range Status   Specimen Description BLOOD LEFT HAND  Final  Special Requests   Final    BOTTLES DRAWN AEROBIC AND ANAEROBIC Blood Culture adequate volume   Culture   Final    NO GROWTH 5 DAYS Performed at [facility], [address], [place], [place] [address]    Report Status 08/18/2019 FINAL  Final  SARS Coronavirus 2 by RT PCR (hospital order, performed in [facility] hospital lab) Nasopharyngeal Nasopharyngeal Swab     Status: None   Collection Time: 08/13/19 11:33 AM   Specimen: Nasopharyngeal Swab  Result Value Ref Range Status   SARS Coronavirus 2 NEGATIVE NEGATIVE Final    Comment: (NOTE) SARS-CoV-2 target nucleic acids are NOT DETECTED. The SARS-CoV-2 RNA is generally detectable in upper and lower respiratory specimens during the acute phase of infection. The lowest concentration of SARS-CoV-2 viral copies this assay can detect is 250 copies / mL. A negative result does not preclude SARS-CoV-2 infection and should not be used as the sole basis for treatment or other Brett Bright management decisions.  A negative result may occur with improper specimen collection / handling, submission of specimen other than nasopharyngeal swab, presence of viral mutation(s) within the areas targeted by this assay, and inadequate number of viral copies (<250 copies / mL). A negative result must be combined with clinical observations, Brett Bright history, and epidemiological information. Fact Sheet for Patients:   StrictlyIdeas.no Fact Sheet for Healthcare Providers: BankingDealers.co.za This test is not yet approved or cleared  by the [place] FDA and has been authorized for detection and/or diagnosis of SARS-CoV-2 by FDA under an Emergency Use Authorization (EUA).  This EUA will remain in effect (meaning this test can be used) for the duration of the COVID-19 declaration under Section 564(b)(1) of the Act, 21 U.S.C. section 360bbb-3(b)(1), unless the authorization is terminated or revoked sooner. Performed  at [facility], [address] [place], [place] [address]   Aerobic/Anaerobic Culture (surgical/deep wound)     Status: None (Preliminary result)   Collection Time: 08/16/19  2:27 PM   Specimen: Abscess  Result Value Ref Range Status   Specimen Description ABSCESS  Final   Special Requests NONE  Final   Gram Stain   Final    ABUNDANT WBC PRESENT, PREDOMINANTLY PMN ABUNDANT GRAM NEGATIVE RODS ABUNDANT GRAM POSITIVE COCCI IN PAIRS IN CHAINS MODERATE GRAM POSITIVE RODS Performed at [facility], [address] [place], [place] [address]    Culture   Final    RARE ESCHERICHIA COLI WITH IN MIXED CULTURE MIXED ANAEROBIC FLORA PRESENT.  CALL LAB IF FURTHER IID REQUIRED.    Report Status PENDING  Incomplete   Organism ID, Bacteria ESCHERICHIA COLI  Final      Susceptibility   Escherichia coli - MIC*    AMPICILLIN <=2 SENSITIVE Sensitive     CEFAZOLIN <=4 SENSITIVE Sensitive     CEFEPIME <=1 SENSITIVE Sensitive     CEFTAZIDIME <=1 SENSITIVE Sensitive     CEFTRIAXONE <=1 SENSITIVE Sensitive     CIPROFLOXACIN <=0.25 SENSITIVE Sensitive     GENTAMICIN 4 SENSITIVE Sensitive     IMIPENEM <=0.25 SENSITIVE Sensitive     TRIMETH/SULFA >=320 RESISTANT Resistant     AMPICILLIN/SULBACTAM <=2 SENSITIVE Sensitive     PIP/TAZO <=4 SENSITIVE Sensitive     * RARE ESCHERICHIA COLI         Radiology Studies: No results found.      Scheduled Meds: . Chlorhexidine Gluconate Cloth  6 each Topical Daily  . enoxaparin (LOVENOX) injection  40 mg Subcutaneous Daily  .  feeding supplement  1 Container Oral TID BM  . insulin aspart  0-15 Units Subcutaneous Q6H  . sodium chloride flush  10-40 mL Intracatheter Q12H  . sodium chloride flush  5 mL Intracatheter Q8H   Continuous Infusions: . piperacillin-tazobactam 3.375 g (08/19/19 0529)  . TPN ADULT (ION) 90 mL/hr at 08/18/19 1725  . TPN ADULT (ION)       LOS: 5 days    Time spent: 35 minutes    Irine Seal, MD [facility]   To contact the attending provider between 7A-7P or the covering provider during after hours 7P-7A, please log into the web site [email or web address] and access using universal [place] password for that web site. If you do not have the password, please call the hospital operator.  08/19/2019, 12:16 PM

## 2019-08-19 NOTE — Progress Notes (Signed)
PHARMACY - TOTAL PARENTERAL NUTRITION CONSULT NOTE   Indication: severe malnutrition  Patient Measurements: Height: 5' 9"  (175.3 cm) Weight: 92.5 kg (203 lb 14.8 oz) IBW/kg (Calculated) : 70.7 TPN AdjBW (KG): 76.2 Body mass index is 30.11 kg/m.  Assessment: 58 yom with PMH UC, portal vein thrombosis, and laparoscopic converted to open cholecystectomy presenting to [facility] from [facility] for UC with L intra-abdominal fluid collection, suspected secondary to descending colonic perforation. Patient acute worsening of nausea and bilious emesis x 1 mo, non-bloody diarrhea 3-4x daily, and hematuria. Also reports poor PO intake over the last month. Patient reports wanting to eat but GI feels TPN appropriate for now to enhance ability to heal, allowing CLD for now pre-op.  Glucose / Insulin: No hx DM. Steroids d/c'd 5/26. CBGs controlled on TPN. 6 units regular insulin in last 24 hours Electrolytes: Na 133, K 4, Mag 2.1, Phos 2.8 Renal: SCr stable wnl, BUN wnl. LFTs / TGs: LFTs trend down - AST now wnl, ALT 47, Tbili wnl  Prealbumin / albumin: prealbumin low 7.3>11.9, albumin 1.6 Intake / Output; MIVF: LBM 5/28, Urine output not recorded, Drain 40 mls out GI Imaging: 5/24 CT abd pelvis - L intra-abdominal gas/fluid collection, ?septic thrombophlebitis, ?colonic perforation Surgeries / Procedures: 5/27 - IR for drain placement  Central access: PICC placed 08/16/19 TPN start date: 08/16/19  Nutritional Goals (per RD recommendation on 5/26): kCal: 2000-2200, Protein: 110-125g, Fluid: >2L Goal TPN rate is 90 mL/hr (provides 121 g of protein and 2138 kcals per day)  Current Nutrition:  TPN FLD  Plan:  Continue TPN rate at 90 mL/hr at 1800. -This TPN will provide 121g protein, 302g CHO, and 62.6g SMOF lipids, for total 2138 kCal, meeting 49% of protein and 48% of kCal needs. Electrolytes in TPN: standard - 81mq/L of Na, 60 mEq/L of K, 529m/L of Ca, 14m13mL of Mg, and 18 mmol/L of Phos. Cl:Ac ratio  1:1 Add standard MVI and trace elements to TPN  Add folic acid 1mg65mr MD to TPN Initiate Moderate q6h SSI and adjust as needed  Monitor TPN labs on Mon/Thurs and tomorrow, GI/Surgery plans  CathAlanda SlimarmD, FCCMNorth Baldwin Infirmarynical Pharmacist Please see AMION for all Pharmacists' Contact Phone Numbers 08/19/2019, 7:23 AM

## 2019-08-19 NOTE — Evaluation (Signed)
Physical Therapy Evaluation and Discharge Patient Details Name: Brett Bright MRN: [id number] DOB: 12-May-1960 Today's Date: 08/19/2019   History of Present Illness  Brett L Howertonis a 59 y.o.malewith medical history of Ulcerative colitis, upper GI bleed, portal vein thrombus, GERD presenting with over 1 week history of nausea, vomiting, and diarrhea. Patient was recently admitted to the hospital from 07/19/2019 to 07/20/2019 with ulcerative colitis flare; admitted with concern for peritonitis secondary to perforated colon/intraabdominal fluid collection; now s/p drain placement  Clinical Impression   Patient evaluated by Physical Therapy with no further acute PT needs identified. All education has been completed and the patient has no further questions. He is transferring and walking well,  Managing in his room independently, and walked the hallways well with me; Recommend he continue walking the hallways independently a few times a day, and pt agrees to do so;  See below for any follow-up Physical Therapy or equipment needs. PT is signing off. Thank you for this referral.     Follow Up Recommendations No PT follow up    Equipment Recommendations  None recommended by PT    Recommendations for Other Services OT consult(as ordered; Noted some trunk mobility limitations effecting his independence with ADLs)     Precautions / Restrictions Precautions Precautions: Other (comment) Precaution Comments: PICC for TPN Restrictions Weight Bearing Restrictions: No      Mobility  Bed Mobility Overal bed mobility: Independent                Transfers Overall transfer level: Independent Equipment used: None             General transfer comment: No difficulty  Ambulation/Gait Ambulation/Gait assistance: Supervision;Modified independent (Device/Increase time) Gait Distance (Feet): 550 Feet Assistive device: IV Pole Gait Pattern/deviations: Step-through pattern Gait  velocity: slightly slow   General Gait Details: Slightly slow, but steady gait; initially supervision, progressing to modified independent; no difficulty wlaking the entire circle of the unit  Stairs            Wheelchair Mobility    Modified Rankin (Stroke Patients Only)       Balance Overall balance assessment: No apparent balance deficits (not formally assessed)                                           Pertinent Vitals/Pain Pain Assessment: Faces Faces Pain Scale: Hurts a little bit Pain Location: Minimal abdominal pain Pain Descriptors / Indicators: Guarding;Grimacing Pain Intervention(s): Monitored during session    Home Living Family/patient expects to be discharged to:: Private residence Living Arrangements: Spouse/significant other;Children Available Help at Discharge: Family Type of Home: House Home Access: Stairs to enter Entrance Stairs-Rails: None Technical brewer of Steps: 2 Home Layout: One level Home Equipment: Shower seat;Hand held shower head      Prior Function Level of Independence: Independent         Comments: 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     [employer]        Extremity/Trunk Assessment   Upper Extremity Assessment Upper Extremity Assessment: Defer to OT evaluation    Lower Extremity Assessment Lower Extremity Assessment: Overall WFL for tasks assessed    Cervical / Trunk Assessment Cervical / Trunk Assessment: Other exceptions Cervical / Trunk Exceptions: Noted limitations in trunk flexion during ADLs  Communication   Communication: No difficulties  Cognition Arousal/Alertness: Awake/alert Behavior During Therapy:  WFL for tasks assessed/performed Overall Cognitive Status: Within Functional Limits for tasks assessed                                        General Comments      Exercises     Assessment/Plan    PT Assessment Patent does not need any further  PT services  PT Problem List         PT Treatment Interventions      PT Goals (Current goals can be found in the Care Plan section)  Acute Rehab PT Goals Patient Stated Goal: to heal; he has planned a vacation out [place] for his family this summer PT Goal Formulation: All assessment and education complete, DC therapy    Frequency     Barriers to discharge        Co-evaluation               AM-PAC PT "6 Clicks" Mobility  Outcome Measure Help needed turning from your back to your side while in a flat bed without using bedrails?: None Help needed moving from lying on your back to sitting on the side of a flat bed without using bedrails?: None Help needed moving to and from a bed to a chair (including a wheelchair)?: None Help needed standing up from a chair using your arms (e.g., wheelchair or bedside chair)?: None Help needed to walk in hospital room?: None Help needed climbing 3-5 steps with a railing? : A Little 6 Click Score: 23    End of Session   Activity Tolerance: Patient tolerated treatment well Patient left: in chair;with call bell/phone within reach Nurse Communication: Mobility status PT Visit Diagnosis: Other abnormalities of gait and mobility (R26.89)    Time: 4801-6553 PT Time Calculation (min) (ACUTE ONLY): 26 min   Charges:   PT Evaluation $PT Eval Low Complexity: 1 Low PT Treatments $Gait Training: 8-22 mins        Roney Marion, PT  Acute Rehabilitation Services Pager [phone] Office [place] 08/19/2019, 1:21 PM

## 2019-08-19 NOTE — Progress Notes (Signed)
Patient ID: Brett Bright, male   DOB: 04/06/1960, 59 y.o.   MRN: [id number] [facility] Surgery Progress Note:   * No surgery found *  Subjective: Mental status is clear.  Sitting up in chair.  He has begun clear liquids PO. Marland Kitchen  Complaints none verbalized. Objective: Vital signs in last 24 hours: Temp:  [98.3 F (36.8 C)-98.7 F (37.1 C)] 98.3 F (36.8 C) (05/30 0449) Pulse Rate:  [98-109] 98 (05/30 0449) Resp:  [17-20] 18 (05/30 0449) BP: (104-113)/(66-73) 106/66 (05/30 0449) SpO2:  [96 %-97 %] 97 % (05/30 0449)  Intake/Output from previous day: 05/29 0701 - 05/30 0700 In: 925 [P.O.:920] Out: 40 [Drains:40] Intake/Output this shift: No intake/output data recorded.  Physical Exam: Work of breathing is normal.  JP with cloudy gray-yellow liquid  Lab Results:  Results for orders placed or performed during the hospital encounter of 08/13/19 (from the past 48 hour(s))  Glucose, capillary     Status: Abnormal   Collection Time: 08/17/19 11:49 AM  Result Value Ref Range   Glucose-Capillary 135 (H) 70 - 99 mg/dL    Comment: Glucose reference range applies only to samples taken after fasting for at least 8 hours.  Glucose, capillary     Status: Abnormal   Collection Time: 08/17/19  6:28 PM  Result Value Ref Range   Glucose-Capillary 123 (H) 70 - 99 mg/dL    Comment: Glucose reference range applies only to samples taken after fasting for at least 8 hours.  Glucose, capillary     Status: Abnormal   Collection Time: 08/17/19  9:34 PM  Result Value Ref Range   Glucose-Capillary 115 (H) 70 - 99 mg/dL    Comment: Glucose reference range applies only to samples taken after fasting for at least 8 hours.  Basic metabolic panel     Status: Abnormal   Collection Time: 08/18/19  4:34 AM  Result Value Ref Range   Sodium 132 (L) 135 - 145 mmol/L   Potassium 3.7 3.5 - 5.1 mmol/L   Chloride 101 98 - 111 mmol/L   CO2 23 22 - 32 mmol/L   Glucose, Bld 120 (H) 70 - 99 mg/dL    Comment:  Glucose reference range applies only to samples taken after fasting for at least 8 hours.   BUN 7 6 - 20 mg/dL   Creatinine, Ser 0.59 (L) 0.61 - 1.24 mg/dL   Calcium 7.7 (L) 8.9 - 10.3 mg/dL   GFR calc non Af Amer >60 >60 mL/min   GFR calc Af Amer >60 >60 mL/min   Anion gap 8 5 - 15    Comment: Performed at [facility] [address], [place], [place] [address]  Magnesium     Status: None   Collection Time: 08/18/19  4:34 AM  Result Value Ref Range   Magnesium 1.8 1.7 - 2.4 mg/dL    Comment: Performed at [facility] [address], [place], [place] [address]  Phosphorus     Status: None   Collection Time: 08/18/19  4:34 AM  Result Value Ref Range   Phosphorus 2.6 2.5 - 4.6 mg/dL    Comment: Performed at [facility] [address], [place],  [address]  Hepatitis A antibody, total     Status: None   Collection Time: 08/18/19  4:34 AM  Result Value Ref Range   hep A Total Ab NON REACTIVE NON REACTIVE    Comment: Performed at [facility],  [address] [place], [place] [address]  Hepatitis B core antibody, total     Status: None   Collection Time: 08/18/19  4:34 AM  Result Value Ref Range   Hep B Core Total Ab NON REACTIVE NON REACTIVE    Comment: Performed at [facility] [address], [place], [place] [address]  Hepatitis B surface antigen     Status: None   Collection Time: 08/18/19  4:34 AM  Result Value Ref Range   Hepatitis B Surface Ag NON REACTIVE NON REACTIVE    Comment: Performed at [facility] [address], [place], [place] [address]  VITAMIN D 25 Hydroxy (Vit-D Deficiency, Fractures)     Status: Abnormal   Collection Time: 08/18/19  4:34 AM  Result Value Ref Range   Vit D, 25-Hydroxy 21.54 (L) 30 - 100 ng/mL    Comment: (NOTE) Vitamin D deficiency has been defined by the [place] practice guideline as a level of serum 25-OH  vitamin D less than 20 ng/mL (1,2). The  Endocrine Society went on to  further define vitamin D insufficiency as a level between 21 and 29  ng/mL (2). 1. IOM (Institute of Medicine). 2010. Dietary reference intakes for  calcium and D. [place]: The [employer]. 2. Holick MF, Binkley Gardiner, Bischoff-Ferrari HA, et al. Evaluation,  treatment, and prevention of vitamin D deficiency: an Endocrine  Society clinical practice guideline, JCEM. 2011 Jul; 96(7): 1911-30. Performed at [facility], [place] [address], [place], [place] [address]   Sedimentation rate     Status: Abnormal   Collection Time: 08/18/19  4:34 AM  Result Value Ref Range   Sed Rate 66 (H) 0 - 16 mm/hr    Comment: Performed at [facility] [address], [place],  [address]  C-reactive protein     Status: Abnormal   Collection Time: 08/18/19  4:34 AM  Result Value Ref Range   CRP 13.6 (H) <1.0 mg/dL    Comment: Performed at [facility] [address] [place], [place] [address]  Glucose, capillary     Status: Abnormal   Collection Time: 08/18/19  5:35 AM  Result Value Ref Range   Glucose-Capillary 136 (H) 70 - 99 mg/dL    Comment: Glucose reference range applies only to samples taken after fasting for at least 8 hours.  Glucose, capillary     Status: Abnormal   Collection Time: 08/18/19 11:53 AM  Result Value Ref Range   Glucose-Capillary 127 (H) 70 - 99 mg/dL    Comment: Glucose reference range applies only to samples taken after fasting for at least 8 hours.  Glucose, capillary     Status: Abnormal   Collection Time: 08/18/19  6:10 PM  Result Value Ref Range   Glucose-Capillary 124 (H) 70 - 99 mg/dL    Comment: Glucose reference range applies only to samples taken after fasting for at least 8 hours.  Glucose, capillary     Status: Abnormal   Collection Time: 08/18/19 11:51 PM  Result Value Ref Range   Glucose-Capillary 122 (H) 70 - 99 mg/dL    Comment: Glucose reference range applies only to samples taken after  fasting for at least 8 hours.  Basic metabolic panel     Status: Abnormal   Collection Time: 08/19/19  4:27 AM  Result Value Ref Range   Sodium 133 (L) 135 - 145 mmol/L  Potassium 4.0 3.5 - 5.1 mmol/L   Chloride 103 98 - 111 mmol/L   CO2 25 22 - 32 mmol/L   Glucose, Bld 131 (H) 70 - 99 mg/dL    Comment: Glucose reference range applies only to samples taken after fasting for at least 8 hours.   BUN 7 6 - 20 mg/dL   Creatinine, Ser 0.55 (L) 0.61 - 1.24 mg/dL   Calcium 7.8 (L) 8.9 - 10.3 mg/dL   GFR calc non Af Amer >60 >60 mL/min   GFR calc Af Amer >60 >60 mL/min   Anion gap 5 5 - 15    Comment: Performed at [facility] [address] [place], [place] [address]  Magnesium     Status: None   Collection Time: 08/19/19  4:27 AM  Result Value Ref Range   Magnesium 2.1 1.7 - 2.4 mg/dL    Comment: Performed at [facility] [address], [place], [place] [address]  Phosphorus     Status: None   Collection Time: 08/19/19  4:27 AM  Result Value Ref Range   Phosphorus 2.8 2.5 - 4.6 mg/dL    Comment: Performed at [facility] [address] [place], [place] [address]  Glucose, capillary     Status: Abnormal   Collection Time: 08/19/19  5:47 AM  Result Value Ref Range   Glucose-Capillary 131 (H) 70 - 99 mg/dL    Comment: Glucose reference range applies only to samples taken after fasting for at least 8 hours.    Radiology/Results: No results found.  Anti-infectives: Anti-infectives (From admission, onward)   Start     Dose/Rate Route Frequency Ordered Stop   08/13/19 2300  piperacillin-tazobactam (ZOSYN) IVPB 3.375 g     3.375 g 100 mL/hr over 30 Minutes Intravenous Every 8 hours 08/13/19 2149     08/13/19 0930  piperacillin-tazobactam (ZOSYN) IVPB 3.375 g     3.375 g 100 mL/hr over 30 Minutes Intravenous  Once 08/13/19 6269 08/13/19 1638      Assessment/Plan: Problem List: Patient Active Problem List   Diagnosis Date Noted  . Dehydration   . Colon  perforation ([place]) 08/14/2019  . Perforated viscus   . Septic thrombophlebitis   . Nausea with vomiting 07/17/2019  . Ulcerative pancolitis with rectal bleeding ([place])   . Iron deficiency anemia due to chronic blood loss   . Acute GI bleeding 08/15/2018  . Symptomatic anemia 08/15/2018  . AKI (acute kidney injury) ([place]) 08/15/2018  . Thrombocytosis ([place]) 08/15/2018  . Epigastric pain   . Ulcerative colitis ([place])   . Nausea vomiting and diarrhea   . Sepsis due to undetermined organism ([place]) 03/22/2016  . Acute calculous cholecystitis 03/22/2016  . 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 Elevated LFTs 03/22/2016  . Hyponatremia 03/22/2016  . Hypokalemia 03/22/2016  . Acute blood loss anemia 03/22/2016  . Abnormal serum level of lipase 03/22/2016  . 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  Repeat CT this next week to assess drain function.   * No surgery found *    LOS: 5 days   Matt B. Hassell Done, MD, [facility] [phone] to reach the surgeon on call.    08/19/2019 8:59 AM

## 2019-08-20 ENCOUNTER — Inpatient Hospital Stay (HOSPITAL_COMMUNITY): Payer: 59

## 2019-08-20 DIAGNOSIS — K5790 Diverticulosis of intestine, part unspecified, without perforation or abscess without bleeding: Secondary | ICD-10-CM

## 2019-08-20 LAB — DIFFERENTIAL
Abs Immature Granulocytes: 0.06 10*3/uL (ref 0.00–0.07)
Basophils Absolute: 0 10*3/uL (ref 0.0–0.1)
Basophils Relative: 0 %
Eosinophils Absolute: 0.2 10*3/uL (ref 0.0–0.5)
Eosinophils Relative: 4 %
Immature Granulocytes: 1 %
Lymphocytes Relative: 13 %
Lymphs Abs: 0.6 10*3/uL — ABNORMAL LOW (ref 0.7–4.0)
Monocytes Absolute: 0.5 10*3/uL (ref 0.1–1.0)
Monocytes Relative: 10 %
Neutro Abs: 3.2 10*3/uL (ref 1.7–7.7)
Neutrophils Relative %: 72 %

## 2019-08-20 LAB — COMPREHENSIVE METABOLIC PANEL
ALT: 127 U/L — ABNORMAL HIGH (ref 0–44)
AST: 97 U/L — ABNORMAL HIGH (ref 15–41)
Albumin: 1.8 g/dL — ABNORMAL LOW (ref 3.5–5.0)
Alkaline Phosphatase: 46 U/L (ref 38–126)
Anion gap: 7 (ref 5–15)
BUN: 8 mg/dL (ref 6–20)
CO2: 26 mmol/L (ref 22–32)
Calcium: 8.1 mg/dL — ABNORMAL LOW (ref 8.9–10.3)
Chloride: 101 mmol/L (ref 98–111)
Creatinine, Ser: 0.56 mg/dL — ABNORMAL LOW (ref 0.61–1.24)
GFR calc Af Amer: >60 mL/min (ref >60)
GFR calc non Af Amer: >60 mL/min (ref >60)
Glucose, Bld: 127 mg/dL — ABNORMAL HIGH (ref 70–99)
Potassium: 4.3 mmol/L (ref 3.5–5.1)
Sodium: 134 mmol/L — ABNORMAL LOW (ref 135–145)
Total Bilirubin: 0.5 mg/dL (ref 0.3–1.2)
Total Protein: 6.4 g/dL — ABNORMAL LOW (ref 6.5–8.1)

## 2019-08-20 LAB — CBC
HCT: 31.6 % — ABNORMAL LOW (ref 39.0–52.0)
Hemoglobin: 9.8 g/dL — ABNORMAL LOW (ref 13.0–17.0)
MCH: 26.8 pg (ref 26.0–34.0)
MCHC: 31 g/dL (ref 30.0–36.0)
MCV: 86.3 fL (ref 80.0–100.0)
Platelets: 276 10*3/uL (ref 150–400)
RBC: 3.66 MIL/uL — ABNORMAL LOW (ref 4.22–5.81)
RDW: 13.3 % (ref 11.5–15.5)
WBC: 4.5 10*3/uL (ref 4.0–10.5)
nRBC: 0 % (ref 0.0–0.2)

## 2019-08-20 LAB — GLUCOSE, CAPILLARY
Glucose-Capillary: 123 mg/dL — ABNORMAL HIGH (ref 70–99)
Glucose-Capillary: 124 mg/dL — ABNORMAL HIGH (ref 70–99)
Glucose-Capillary: 116 mg/dL — ABNORMAL HIGH (ref 70–99)

## 2019-08-20 LAB — QUANTIFERON-TB GOLD PLUS (RQFGPL)
QuantiFERON Mitogen Value: 2.1 IU/mL
QuantiFERON Nil Value: 0.02 IU/mL
QuantiFERON TB1 Ag Value: 0.02 IU/mL
QuantiFERON TB2 Ag Value: 0.02 IU/mL

## 2019-08-20 LAB — MAGNESIUM: Magnesium: 2 mg/dL (ref 1.7–2.4)

## 2019-08-20 LAB — AEROBIC/ANAEROBIC CULTURE W GRAM STAIN (SURGICAL/DEEP WOUND)

## 2019-08-20 LAB — QUANTIFERON-TB GOLD PLUS: QuantiFERON-TB Gold Plus: NEGATIVE

## 2019-08-20 LAB — TRIGLYCERIDES: Triglycerides: 73 mg/dL (ref <150)

## 2019-08-20 LAB — HEPATITIS B SURFACE ANTIBODY, QUANTITATIVE: Hep B S AB Quant (Post): <3.1 m[IU]/mL — ABNORMAL LOW (ref >9.9)

## 2019-08-20 LAB — PREALBUMIN: Prealbumin: 12 mg/dL — ABNORMAL LOW (ref 18–38)

## 2019-08-20 LAB — PHOSPHORUS: Phosphorus: 3.3 mg/dL (ref 2.5–4.6)

## 2019-08-20 MED ORDER — TRAVASOL 10 % IV SOLN
INTRAVENOUS | Status: AC
  Filled 2019-08-20: qty 1209.6

## 2019-08-20 MED ORDER — IOHEXOL 300 MG/ML  SOLN
100.0000 mL | Freq: Once | INTRAMUSCULAR | Status: AC | PRN
  Administered 2019-08-20: 100 mL via INTRAVENOUS

## 2019-08-20 NOTE — Progress Notes (Signed)
PHARMACY - TOTAL PARENTERAL NUTRITION CONSULT NOTE   Indication: severe malnutrition  Patient Measurements: Height: 5' 9"  (175.3 cm) Weight: 92.5 kg (203 lb 14.8 oz) IBW/kg (Calculated) : 70.7 TPN AdjBW (KG): 76.2 Body mass index is 30.11 kg/m.  Assessment: 60 yom with PMH UC, portal vein thrombosis, and laparoscopic converted to open cholecystectomy presenting to [facility] from [facility] for UC with L intra-abdominal fluid collection, suspected secondary to descending colonic perforation. Patient acute worsening of nausea and bilious emesis x 1 mo, non-bloody diarrhea 3-4x daily, and hematuria. Also reports poor PO intake over the last month. Patient reports wanting to eat but GI feels TPN appropriate for now to enhance ability to heal, allowing CLD for now pre-op.  Glucose / Insulin: No hx DM. Steroids d/c'd 5/26. CBGs controlled. 8 units regular insulin in last 24 hours Electrolytes: Na 134, others WNL Renal: SCr stable wnl, BUN wnl LFTs / TGs: LFTs trend up - AST now 97, ALT 127, Tbili / TG wnl  Prealbumin / albumin: prealbumin 7.3>>12, albumin 1.8 Intake / Output; MIVF: Having loose BMs, Drain 25 ml/24hr output GI Imaging: 5/24 CT abd pelvis - L intra-abdominal gas/fluid collection, ?septic thrombophlebitis, ?colonic perforation 5/31 CT abd pelvis - pending Surgeries / Procedures: 5/27 - IR for drain placement  Central access: PICC placed 08/16/19 TPN start date: 08/16/19  Nutritional Goals (per RD recommendation on 5/26): kCal: 2000-2200, Protein: 110-125g, Fluid: >2L Goal TPN rate is 90 mL/hr (provides 121 g of protein and 2138 kcals per day)  Current Nutrition:  TPN FLD started 5/29 - 90% of 2 meals charted yesterday Boost feeding supplement TID - 2 doses charted yesterday  Plan:  Continue TPN rate at goal rate 90 mL/hr at 1800. -This TPN will provide 121g protein, 302g CHO, and 63g SMOF lipids, for total 2138 kCal, meeting 100% of patient needs. Electrolytes in TPN: no changes today  - 17mq/L of Na, 60 mEq/L of K, 580m/L of Ca, 68m69mL of Mg, and 18 mmol/L of Phos. Cl:Ac ratio 1:1 Add standard MVI and trace elements + folic acid 1mg3mr MD to TPN Continue Moderate q6h SSI and adjust as needed  Monitor TPN labs, GI/Surgery plans   HaleArturo MortonarmD, BCPS Please check AMION for all [facility] PRoss Cornertact numbers Clinical Pharmacist 08/20/2019 7:45 AM

## 2019-08-20 NOTE — Progress Notes (Signed)
Chief Complaint: Patient was seen today for abscess drain   Supervising Physician: Markus Daft  Patient Status: [facility] - In-pt  Subjective: S/p perc drain of left intra-abdominal abscess on 5/27 Feeling some better Had repeat CT this am  Objective: Physical Exam: BP 108/71 (BP Location: Left Arm)   Pulse 91   Temp 98.8 F (37.1 C) (Oral)   Resp 17   Ht 5' 9"  (1.753 m)   Wt 92.5 kg   SpO2 94%   BMI 30.11 kg/m  LLQ drain intact, site clean, NT Output still cloudy/purulent   Current Facility-Administered Medications:  .  acetaminophen (TYLENOL) tablet 650 mg, 650 mg, Oral, Q6H PRN **OR** acetaminophen (TYLENOL) suppository 650 mg, 650 mg, Rectal, Q6H PRN, Tat, Shanon Brow, MD .  Chlorhexidine Gluconate Cloth 2 % PADS 6 each, 6 each, Topical, Daily, Mansouraty, Telford Nab., MD, 6 each at 08/20/19 0950 .  enoxaparin (LOVENOX) injection 40 mg, 40 mg, Subcutaneous, Daily, Eugenie Filler, MD, 40 mg at 08/20/19 0950 .  feeding supplement (BOOST / RESOURCE BREEZE) liquid 1 Container, 1 Container, Oral, TID BM, [place]aan, Darci Current, PA-C, 1 Container at 08/20/19 [phone] .  insulin aspart (novoLOG) injection 0-15 Units, 0-15 Units, Subcutaneous, Q6H, von Caren Griffins, RPH, 2 Units at 08/20/19 0543 .  morphine 4 MG/ML injection 4 mg, 4 mg, Intravenous, Q4H PRN, Maudie Flakes, MD, 4 mg at 08/17/19 0009 .  ondansetron (ZOFRAN) tablet 4 mg, 4 mg, Oral, Q6H PRN **OR** ondansetron (ZOFRAN) injection 4 mg, 4 mg, Intravenous, Q6H PRN, Tat, David, MD .  piperacillin-tazobactam (ZOSYN) IVPB 3.375 g, 3.375 g, Intravenous, Q8H, Hammons, Kimberly B, RPH, Last Rate: 12.5 mL/hr at 08/20/19 0540, 3.375 g at 08/20/19 0540 .  sodium chloride flush (NS) 0.9 % injection 10-40 mL, 10-40 mL, Intracatheter, Q12H, Mansouraty, Telford Nab., MD, 10 mL at 08/19/19 1036 .  sodium chloride flush (NS) 0.9 % injection 10-40 mL, 10-40 mL, Intracatheter, PRN, Mansouraty, Telford Nab., MD .  sodium chloride flush (NS) 0.9  % injection 5 mL, 5 mL, Intracatheter, Q8H, Wagner, Jaime, DO, 5 mL at 08/20/19 0541 .  TPN ADULT (ION), , Intravenous, Continuous TPN, Eugenie Filler, MD, Last Rate: 90 mL/hr at 08/19/19 1807, New Bag at 08/19/19 1807 .  TPN ADULT (ION), , Intravenous, Continuous TPN, von Caren Griffins, RPH  Labs: CBC Recent Labs    08/20/19 0409  WBC 4.5  HGB 9.8*  HCT 31.6*  PLT 276   BMET Recent Labs    08/19/19 0427 08/20/19 0409  NA 133* 134*  K 4.0 4.3  CL 103 101  CO2 25 26  GLUCOSE 131* 127*  BUN 7 8  CREATININE 0.55* 0.56*  CALCIUM 7.8* 8.1*   LFT Recent Labs    08/20/19 0409  PROT 6.4*  ALBUMIN 1.8*  AST 97*  ALT 127*  ALKPHOS 46  BILITOT 0.5   PT/INR No results for input(s): LABPROT, INR in the last 72 hours.   Studies/Results: CT ABDOMEN PELVIS W CONTRAST  Result Date: 08/20/2019 CLINICAL DATA:  Inpatient. Ulcerative colitis. Interval percutaneous left abdominal abscess drainage. EXAM: CT ABDOMEN AND PELVIS WITH CONTRAST TECHNIQUE: Multidetector CT imaging of the abdomen and pelvis was performed using the standard protocol following bolus administration of intravenous contrast. CONTRAST:  133m OMNIPAQUE IOHEXOL 300 MG/ML  SOLN COMPARISON:  08/13/2019 CT abdomen/pelvis. FINDINGS: Lower chest: No significant pulmonary nodules or acute consolidative airspace disease. Hepatobiliary: Normal liver size. No discrete liver mass. Cholecystectomy. No biliary ductal  dilatation. Pancreas: Normal, with no mass or duct dilation. Spleen: Normal size. No mass. Adrenals/Urinary Tract: Normal adrenals. No hydronephrosis. Exophytic hyperdense 2.2 cm renal cortical lesion in the anterior lower right kidney (series 3/image 48), unchanged. Numerous simple renal cysts in both kidneys, largest 11.9 cm in the upper right kidney. Several subcentimeter hypodense renal cortical lesions in both kidneys are too small to characterize. Normal bladder. Stomach/Bowel: Small hiatal hernia. Otherwise  normal nondistended stomach. Normal caliber small bowel. Appendectomy. Scattered mild colonic diverticula. Wall thickening throughout sigmoid colon and rectum, improved. Interval placement of percutaneous pigtail drain in the complex left abdominal collection, which is significantly decreased, with no residual measurable collection surrounding the catheter. Mild reactive wall thickening in the left small bowel is improved. Residual tiny 2.6 x 1.5 cm posterior left peritoneal collection (series 3/image 52), decreased from 9.6 x 5.5 cm. 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 Persistent ill-defined fat stranding and phlegmonous change in the left small bowel mesentery, significantly improved (series 3/image 65). No new collections. Vascular/Lymphatic: Normal caliber abdominal aorta. Patent portal, splenic, hepatic and renal veins. No pathologically enlarged lymph nodes in the abdomen or pelvis. Reproductive: Normal size prostate.  No ascites. Other: No pneumoperitoneum, ascites or focal fluid collection. Musculoskeletal: No aggressive appearing focal osseous lesions. Moderate lumbar spondylosis. IMPRESSION: 1. Significant interval reduction in size of complex left abdominal abscess status post percutaneous pigtail drain placement, as detailed. 2. Persistent ill-defined fat stranding and phlegmonous change in the left small bowel mesentery, significantly improved. No new collections. 3. 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 4. Reactive left small bowel thickening is improved. Wall thickening throughout the sigmoid colon and rectum is improved. 5. Stable exophytic  hyperdense 2.2 cm renal cortical lesion in the anterior lower right kidney, indeterminate. MRI abdomen without and with IV contrast is recommended on a short term outpatient basis for further characterization. 6. Small hiatal hernia. Electronically Signed   By: Ilona Sorrel M.D.   On: 08/20/2019 08:25    Assessment/Plan: S/p perc drain LLQ abscess CT shows interval improvement bust still residual abscess with complex interloop fistula. Continue drain care and flushes. IR following   LOS: 6 days   I spent a total of 20 minutes in face to face in clinical consultation, greater than 50% of which was counseling/coordinating care for perc abscess drain  Ascencion Dike PA-C 08/20/2019 10:44 AM

## 2019-08-20 NOTE — Progress Notes (Signed)
Patient ID: Brett Bright, male   DOB: July 08, 1960, 59 y.o.   MRN: [id number] [facility] Surgery Progress Note:   * No surgery found *  Subjective: Mental status is clear.  Complaints none-He just returned from followup CT scan. Objective: Vital signs in last 24 hours: Temp:  [98.2 F (36.8 C)-99.3 F (37.4 C)] 98.8 F (37.1 C) (05/31 0437) Pulse Rate:  [91-105] 91 (05/31 0437) Resp:  [17-19] 17 (05/31 0437) BP: (102-109)/(70-78) 108/71 (05/31 0437) SpO2:  [94 %-98 %] 94 % (05/31 0437)  Intake/Output from previous day: 05/30 0701 - 05/31 0700 In: 2071.2 [P.O.:1070; I.V.:941.2; IV Piggyback:50] Out: 32 [Drains:32] Intake/Output this shift: No intake/output data recorded.  Physical Exam: Work of breathing is normal.  JP drain is less but still cloudy  Lab Results:  Results for orders placed or performed during the hospital encounter of 08/13/19 (from the past 48 hour(s))  Glucose, capillary     Status: Abnormal   Collection Time: 08/18/19 11:53 AM  Result Value Ref Range   Glucose-Capillary 127 (H) 70 - 99 mg/dL    Comment: Glucose reference range applies only to samples taken after fasting for at least 8 hours.  Glucose, capillary     Status: Abnormal   Collection Time: 08/18/19  6:10 PM  Result Value Ref Range   Glucose-Capillary 124 (H) 70 - 99 mg/dL    Comment: Glucose reference range applies only to samples taken after fasting for at least 8 hours.  Glucose, capillary     Status: Abnormal   Collection Time: 08/18/19 11:51 PM  Result Value Ref Range   Glucose-Capillary 122 (H) 70 - 99 mg/dL    Comment: Glucose reference range applies only to samples taken after fasting for at least 8 hours.  Basic metabolic panel     Status: Abnormal   Collection Time: 08/19/19  4:27 AM  Result Value Ref Range   Sodium 133 (L) 135 - 145 mmol/L   Potassium 4.0 3.5 - 5.1 mmol/L   Chloride 103 98 - 111 mmol/L   CO2 25 22 - 32 mmol/L   Glucose, Bld 131 (H) 70 - 99 mg/dL     Comment: Glucose reference range applies only to samples taken after fasting for at least 8 hours.   BUN 7 6 - 20 mg/dL   Creatinine, Ser 0.55 (L) 0.61 - 1.24 mg/dL   Calcium 7.8 (L) 8.9 - 10.3 mg/dL   GFR calc non Af Amer >60 >60 mL/min   GFR calc Af Amer >60 >60 mL/min   Anion gap 5 5 - 15    Comment: Performed at [facility] [address], [place], [place] [address]  Magnesium     Status: None   Collection Time: 08/19/19  4:27 AM  Result Value Ref Range   Magnesium 2.1 1.7 - 2.4 mg/dL    Comment: Performed at [facility] [address], [place], [place] [address]  Phosphorus     Status: None   Collection Time: 08/19/19  4:27 AM  Result Value Ref Range   Phosphorus 2.8 2.5 - 4.6 mg/dL    Comment: Performed at [facility] [address] [place], [place] [address]  Glucose, capillary     Status: Abnormal   Collection Time: 08/19/19  5:47 AM  Result Value Ref Range   Glucose-Capillary 131 (H) 70 - 99 mg/dL    Comment: Glucose reference range applies only to samples taken after fasting for at least  8 hours.  Glucose, capillary     Status: Abnormal   Collection Time: 08/19/19 12:15 PM  Result Value Ref Range   Glucose-Capillary 137 (H) 70 - 99 mg/dL    Comment: Glucose reference range applies only to samples taken after fasting for at least 8 hours.  Glucose, capillary     Status: Abnormal   Collection Time: 08/19/19  5:55 PM  Result Value Ref Range   Glucose-Capillary 110 (H) 70 - 99 mg/dL    Comment: Glucose reference range applies only to samples taken after fasting for at least 8 hours.  Glucose, capillary     Status: Abnormal   Collection Time: 08/19/19 11:55 PM  Result Value Ref Range   Glucose-Capillary 141 (H) 70 - 99 mg/dL    Comment: Glucose reference range applies only to samples taken after fasting for at least 8 hours.  Comprehensive metabolic panel     Status: Abnormal   Collection Time: 08/20/19  4:09 AM  Result Value Ref Range   Sodium  134 (L) 135 - 145 mmol/L   Potassium 4.3 3.5 - 5.1 mmol/L   Chloride 101 98 - 111 mmol/L   CO2 26 22 - 32 mmol/L   Glucose, Bld 127 (H) 70 - 99 mg/dL    Comment: Glucose reference range applies only to samples taken after fasting for at least 8 hours.   BUN 8 6 - 20 mg/dL   Creatinine, Ser 0.56 (L) 0.61 - 1.24 mg/dL   Calcium 8.1 (L) 8.9 - 10.3 mg/dL   Total Protein 6.4 (L) 6.5 - 8.1 g/dL   Albumin 1.8 (L) 3.5 - 5.0 g/dL   AST 97 (H) 15 - 41 U/L   ALT 127 (H) 0 - 44 U/L   Alkaline Phosphatase 46 38 - 126 U/L   Total Bilirubin 0.5 0.3 - 1.2 mg/dL   GFR calc non Af Amer >60 >60 mL/min   GFR calc Af Amer >60 >60 mL/min   Anion gap 7 5 - 15    Comment: Performed at [facility] [address], [place], [place] [address]  Magnesium     Status: None   Collection Time: 08/20/19  4:09 AM  Result Value Ref Range   Magnesium 2.0 1.7 - 2.4 mg/dL    Comment: Performed at [facility] [address] [place], [place] [address]  Phosphorus     Status: None   Collection Time: 08/20/19  4:09 AM  Result Value Ref Range   Phosphorus 3.3 2.5 - 4.6 mg/dL    Comment: Performed at [facility] [address] [place], [place] [address]  CBC     Status: Abnormal   Collection Time: 08/20/19  4:09 AM  Result Value Ref Range   WBC 4.5 4.0 - 10.5 K/uL   RBC 3.66 (L) 4.22 - 5.81 MIL/uL   Hemoglobin 9.8 (L) 13.0 - 17.0 g/dL   HCT 31.6 (L) 39.0 - 52.0 %   MCV 86.3 80.0 - 100.0 fL   MCH 26.8 26.0 - 34.0 pg   MCHC 31.0 30.0 - 36.0 g/dL   RDW 13.3 11.5 - 15.5 %   Platelets 276 150 - 400 K/uL   nRBC 0.0 0.0 - 0.2 %    Comment: Performed at [facility], [place] [address] [place], [place] [address]  Differential     Status: Abnormal   Collection Time: 08/20/19  4:09 AM  Result Value Ref Range   Neutrophils Relative %  72 %   Neutro Abs 3.2 1.7 - 7.7 K/uL   Lymphocytes Relative 13 %   Lymphs Abs 0.6 (L) 0.7 - 4.0 K/uL   Monocytes Relative 10 %   Monocytes Absolute 0.5 0.1 - 1.0  K/uL   Eosinophils Relative 4 %   Eosinophils Absolute 0.2 0.0 - 0.5 K/uL   Basophils Relative 0 %   Basophils Absolute 0.0 0.0 - 0.1 K/uL   Immature Granulocytes 1 %   Abs Immature Granulocytes 0.06 0.00 - 0.07 K/uL    Comment: Performed at [facility] [address], [place], [place] [address]  Triglycerides     Status: None   Collection Time: 08/20/19  4:09 AM  Result Value Ref Range   Triglycerides 73 <150 mg/dL    Comment: Performed at [facility] [address] [place], [place] [address]  Prealbumin     Status: Abnormal   Collection Time: 08/20/19  4:09 AM  Result Value Ref Range   Prealbumin 12.0 (L) 18 - 38 mg/dL    Comment: Performed at [facility] [address], [place], [place] [address]  Glucose, capillary     Status: Abnormal   Collection Time: 08/20/19  5:39 AM  Result Value Ref Range   Glucose-Capillary 123 (H) 70 - 99 mg/dL    Comment: Glucose reference range applies only to samples taken after fasting for at least 8 hours.    Radiology/Results: CT ABDOMEN PELVIS W CONTRAST  Result Date: 08/20/2019 CLINICAL DATA:  Inpatient. Ulcerative colitis. Interval percutaneous left abdominal abscess drainage. EXAM: CT ABDOMEN AND PELVIS WITH CONTRAST TECHNIQUE: Multidetector CT imaging of the abdomen and pelvis was performed using the standard protocol following bolus administration of intravenous contrast. CONTRAST:  164m OMNIPAQUE IOHEXOL 300 MG/ML  SOLN COMPARISON:  08/13/2019 CT abdomen/pelvis. FINDINGS: Lower chest: No significant pulmonary nodules or acute consolidative airspace disease. Hepatobiliary: Normal liver size. No discrete liver mass. Cholecystectomy. No biliary ductal dilatation. Pancreas: Normal, with no mass or duct dilation. Spleen: Normal size. No mass. Adrenals/Urinary Tract: Normal adrenals. No hydronephrosis. Exophytic hyperdense 2.2 cm renal cortical lesion in the anterior lower right kidney (series 3/image 48), unchanged. Numerous  simple renal cysts in both kidneys, largest 11.9 cm in the upper right kidney. Several subcentimeter hypodense renal cortical lesions in both kidneys are too small to characterize. Normal bladder. Stomach/Bowel: Small hiatal hernia. Otherwise normal nondistended stomach. Normal caliber small bowel. Appendectomy. Scattered mild colonic diverticula. Wall thickening throughout sigmoid colon and rectum, improved. Interval placement of percutaneous pigtail drain in the complex left abdominal collection, which is significantly decreased, with no residual measurable collection surrounding the catheter. Mild reactive wall thickening in the left small bowel is improved. Residual tiny 2.6 x 1.5 cm posterior left peritoneal collection (series 3/image 52), decreased from 9.6 x 5.5 cm. 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 Persistent ill-defined fat stranding and phlegmonous change in the left small bowel mesentery, significantly improved (series 3/image 65). No new collections. Vascular/Lymphatic: Normal caliber abdominal aorta. Patent portal, splenic, hepatic and renal veins. No pathologically enlarged lymph nodes in the abdomen or pelvis. Reproductive: Normal size prostate.  No ascites. Other: No pneumoperitoneum, ascites or focal fluid collection. Musculoskeletal: No aggressive appearing focal osseous lesions. Moderate lumbar spondylosis. IMPRESSION: 1. Significant interval reduction in size of  complex left abdominal abscess status post percutaneous pigtail drain placement, as detailed. 2. Persistent ill-defined fat stranding and phlegmonous change in the left small bowel mesentery, significantly improved. No new collections. 3. Persistent extraluminal gas within complex interloop fistula between the  sigmoid colon and rectum, with significantly decreased fluid associated with this complex fistula. 4. Reactive left small bowel thickening is improved. Wall thickening throughout the sigmoid colon and rectum is improved. 5. Stable exophytic hyperdense 2.2 cm renal cortical lesion in the anterior lower right kidney, indeterminate. MRI abdomen without and with IV contrast is recommended on a short term outpatient basis for further characterization. 6. Small hiatal hernia. Electronically Signed   By: Ilona Sorrel M.D.   On: 08/20/2019 08:25    Anti-infectives: Anti-infectives (From admission, onward)   Start     Dose/Rate Route Frequency Ordered Stop   08/19/19 1545  piperacillin-tazobactam (ZOSYN) IVPB 3.375 g     3.375 g 12.5 mL/hr over 240 Minutes Intravenous Every 8 hours 08/19/19 1533     08/13/19 2300  piperacillin-tazobactam (ZOSYN) IVPB 3.375 g  Status:  Discontinued     3.375 g 100 mL/hr over 30 Minutes Intravenous Every 8 hours 08/13/19 2149 08/19/19 1532   08/13/19 0930  piperacillin-tazobactam (ZOSYN) IVPB 3.375 g     3.375 g 100 mL/hr over 30 Minutes Intravenous  Once 08/13/19 6063 08/13/19 1638      Assessment/Plan: Problem List: Patient Active Problem List   Diagnosis Date Noted  . Dehydration   . Colon perforation ([place]) 08/14/2019  . Perforated viscus   . Septic thrombophlebitis   . Nausea with vomiting 07/17/2019  . Ulcerative pancolitis with rectal bleeding ([place])   . Iron deficiency anemia due to chronic blood loss   . Acute GI bleeding 08/15/2018  . Symptomatic anemia 08/15/2018  . AKI (acute kidney injury) ([place]) 08/15/2018  . Thrombocytosis ([place]) 08/15/2018  . Epigastric pain   . Ulcerative colitis ([place])   . Nausea vomiting and diarrhea   . Sepsis due to undetermined organism ([place]) 03/22/2016  . Acute calculous cholecystitis 03/22/2016  . 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  Elevated LFTs 03/22/2016  . Hyponatremia 03/22/2016  . Hypokalemia 03/22/2016  . Acute blood loss anemia 03/22/2016  . Abnormal serum level of lipase 03/22/2016  . 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 Significant interval reduction in size of complex left abdominal abscess status post percutaneous pigtail drain placement, as detailed. 2. Persistent ill-defined fat stranding and phlegmonous change in the left small bowel mesentery, significantly improved. No new collections. 3. 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 4. Reactive left small bowel thickening is improved. Wall thickening throughout the sigmoid colon and rectum is improved. 5. Stable exophytic hyperdense 2.2 cm renal cortical lesion in the anterior lower right kidney, indeterminate. MRI abdomen without and with IV contrast is recommended on a short term outpatient basis for further characterization. 6. Small hiatal hernia.  He has been stable on TNA and liquids PO.  This may need to go on for a while if surgery is contemplated to address the perforation and the fistula.   * No surgery found *    LOS: 6 days   Matt B. Hassell Done, MD, [facility] [phone]  to reach the surgeon on call.    08/20/2019 8:34 AM

## 2019-08-20 NOTE — Progress Notes (Signed)
Progress Note   Subjective  Chief Complaint: Intra-abdominal, retroperitoneal fluid collection abscess with history of UC  Today, the patient is in good spirits.  He is sitting up in the bed chair.  He is wondering if he will still build to go home later this week.  Tells me his drain continues to have output, denies abdominal pain.  Has been tolerating his full liquid diet.   Objective   Vital signs in last 24 hours: Temp:  [98.2 F (36.8 C)-99.3 F (37.4 C)] 98.8 F (37.1 C) (05/31 0437) Pulse Rate:  [91-105] 91 (05/31 0437) Resp:  [17-19] 17 (05/31 0437) BP: (102-109)/(70-78) 108/71 (05/31 0437) SpO2:  [94 %-98 %] 94 % (05/31 0437) Last BM Date: 08/19/19 General:    white male in NAD Heart:  Regular rate and rhythm; no murmurs Lungs: Respirations even and unlabored, lungs CTA bilaterally Abdomen:  Soft, nontender and nondistended. Normal bowel sounds.  Positive PERC drain in place with drainage of white milky liquid less than 25 cc Extremities:  Without edema. Neurologic:  Alert and oriented,  grossly normal neurologically. Psych:  Cooperative. Normal mood and affect.  Intake/Output from previous day: 05/30 0701 - 05/31 0700 In: 2071.2 [P.O.:1070; I.V.:941.2; IV Piggyback:50] Out: 32 [Drains:32] Intake/Output this shift: Total I/O In: 485 [P.O.:480; Other:5] Out: 10 [Drains:10]  Lab Results: Recent Labs    08/20/19 0409  WBC 4.5  HGB 9.8*  HCT 31.6*  PLT 276   BMET Recent Labs    08/18/19 0434 08/19/19 0427 08/20/19 0409  NA 132* 133* 134*  K 3.7 4.0 4.3  CL 101 103 101  CO2 23 25 26   GLUCOSE 120* 131* 127*  BUN 7 7 8   CREATININE 0.59* 0.55* 0.56*  CALCIUM 7.7* 7.8* 8.1*   LFT Recent Labs    08/20/19 0409  PROT 6.4*  ALBUMIN 1.8*  AST 97*  ALT 127*  ALKPHOS 46  BILITOT 0.5   Studies/Results: CT ABDOMEN PELVIS W CONTRAST  Result Date: 08/20/2019 CLINICAL DATA:  Inpatient. Ulcerative colitis. Interval percutaneous left abdominal abscess  drainage. EXAM: CT ABDOMEN AND PELVIS WITH CONTRAST TECHNIQUE: Multidetector CT imaging of the abdomen and pelvis was performed using the standard protocol following bolus administration of intravenous contrast. CONTRAST:  174m OMNIPAQUE IOHEXOL 300 MG/ML  SOLN COMPARISON:  08/13/2019 CT abdomen/pelvis. FINDINGS: Lower chest: No significant pulmonary nodules or acute consolidative airspace disease. Hepatobiliary: Normal liver size. No discrete liver mass. Cholecystectomy. No biliary ductal dilatation. Pancreas: Normal, with no mass or duct dilation. Spleen: Normal size. No mass. Adrenals/Urinary Tract: Normal adrenals. No hydronephrosis. Exophytic hyperdense 2.2 cm renal cortical lesion in the anterior lower right kidney (series 3/image 48), unchanged. Numerous simple renal cysts in both kidneys, largest 11.9 cm in the upper right kidney. Several subcentimeter hypodense renal cortical lesions in both kidneys are too small to characterize. Normal bladder. Stomach/Bowel: Small hiatal hernia. Otherwise normal nondistended stomach. Normal caliber small bowel. Appendectomy. Scattered mild colonic diverticula. Wall thickening throughout sigmoid colon and rectum, improved. Interval placement of percutaneous pigtail drain in the complex left abdominal collection, which is significantly decreased, with no residual measurable collection surrounding the catheter. Mild reactive wall thickening in the left small bowel is improved. Residual tiny 2.6 x 1.5 cm posterior left peritoneal collection (series 3/image 52), decreased from 9.6 x 5.5 cm. 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 Persistent ill-defined fat stranding and phlegmonous change in the left small bowel mesentery, significantly improved (series  3/image 65). No new collections. Vascular/Lymphatic: Normal caliber abdominal aorta. Patent portal, splenic, hepatic and renal veins. No pathologically enlarged lymph nodes in the abdomen or pelvis. Reproductive: Normal size prostate.  No ascites. Other: No pneumoperitoneum, ascites or focal fluid collection. Musculoskeletal: No aggressive appearing focal osseous lesions. Moderate lumbar spondylosis. IMPRESSION: 1. Significant interval reduction in size of complex left abdominal abscess status post percutaneous pigtail drain placement, as detailed. 2. Persistent ill-defined fat stranding and phlegmonous change in the left small bowel mesentery, significantly improved. No new collections. 3. 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 4. Reactive left small bowel thickening is improved. Wall thickening throughout the sigmoid colon and rectum is improved. 5. Stable exophytic hyperdense 2.2 cm renal cortical lesion in the anterior lower right kidney, indeterminate. MRI abdomen without and with IV contrast is recommended on a short term outpatient basis for further characterization. 6. Small hiatal hernia. Electronically Signed   By: Ilona Sorrel M.D.   On: 08/20/2019 08:25     Assessment / Plan:   Assessment: 1.  Ulcerative colitis (though current situation raises question of Crohn's) with complication of perforation, intra-abdominal and retroperitoneal abscesses/fluid collections: Drain in place, decreasing in amount each day, recent repeat CT showing significant improvement 2.  Protein malnutrition: TPN since 5/27 3.  IMV thrombosis, prior portal thrombosis: SQ Lovenox in place 4.  Liver lobularity: There was initial question of fibrosis/cirrhosis but most recent CT scan shows normal liver 5.  IDA  Plan: 1.  Continue supportive care 2.  Please await further recommendations from  Dr. Stefani Dama Oddy later today  Thank you for kind consultation, we will continue to follow.   LOS: 6 days   Brett Bright  08/20/2019, 10:16 AM

## 2019-08-20 NOTE — Progress Notes (Addendum)
Occupational Therapy Evaluation and Discharge Patient Details Name: Brett Bright MRN: [id number] DOB: 1960-09-12 Today's Date: 08/20/2019    History of Present Illness Brett Bright a 59 y.o.malewith medical history of Ulcerative colitis, upper GI bleed, portal vein thrombus, GERD presenting with over 1 week history of nausea, vomiting, and diarrhea. Patient was recently admitted to the hospital from 07/19/2019 to 07/20/2019 with ulcerative colitis flare; admitted with concern for peritonitis secondary to perforated colon/intraabdominal fluid collection; now s/p drain placement   Clinical Impression   Prior to hospitalization, pt was independent with ADLs/IADLs, including driving and working full-time. Pt admitted for above and limited by decreased activity tolerance. Today, pt received up in recliner. Pt demonstrated good safety awareness and balance while performing functional transfers and BADLs. Pt performed toilet transfer and grooming tasks at sink with Mod I using extended time for safety purposes. Pt able to manage IV pole independently, no LOB noted. Recommended BUE exercises at home using small amount of resistance to maintain UB strength/ROM. Skilled HHOT not warranted at this time, pt is safe to return home without assistance. Educated pt on EC strategies, in regards to work and more complex ADLs, pt receptive. Pt confirmed having good support at home from wife and applicable AE/DME already. OT signing off.     Follow Up Recommendations  No OT follow up;Supervision - Intermittent(for safety purposes during more complex ADLs/IADLs)    Equipment Recommendations  None recommended by OT    Recommendations for Other Services       Precautions / Restrictions Precautions Precautions: Other (comment)(PICC) Precaution Comments: PICC for TPN Restrictions Weight Bearing Restrictions: No      Mobility Bed Mobility               General bed mobility comments:  received up in chair  Transfers Overall transfer level: Independent Equipment used: None             General transfer comment: No difficulty    Balance Overall balance assessment: No apparent balance deficits (not formally assessed)                                         ADL either performed or assessed with clinical judgement   ADL Overall ADL's : Modified independent                                       General ADL Comments: performs BADLs with good safety awareness and balance without a mobility device; 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, nutritional      Pertinent Vitals/Pain Pain Assessment: No/denies pain     Hand Dominance Left   Extremity/Trunk Assessment Upper Extremity Assessment Upper Extremity Assessment: Overall WFL for tasks assessed   Lower Extremity Assessment Lower Extremity Assessment: Defer to PT evaluation   Cervical / Trunk Assessment Cervical / Trunk Assessment: Other exceptions Cervical / Trunk Exceptions: Noted limitations in trunk flexion during ADLs   Communication Communication Communication: No difficulties   Cognition Arousal/Alertness: Awake/alert Behavior During Therapy: WFL for tasks assessed/performed Overall Cognitive Status: Within Functional Limits for tasks assessed  General Comments  able to manage lines independently; understands EC strategies    Exercises     Shoulder Instructions      Home Living Family/patient expects to be discharged to:: Private residence Living Arrangements: Spouse/significant other;Children Available Help at Discharge: Family Type of Home: House Home Access: Stairs to enter Technical brewer of Steps: 2 Entrance Stairs-Rails: None Home Layout: One level     Bathroom Shower/Tub: Occupational psychologist: Standard     Home Equipment:  Hand held shower head;Shower seat;Grab bars - tub/shower          Prior Functioning/Environment Level of Independence: Independent        Comments: 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        OT Problem List: Decreased activity tolerance      OT Treatment/Interventions:      OT Goals(Current goals can be found in the care plan section) Acute Rehab OT Goals Patient Stated Goal: to heal; he has planned a vacation out [place] for his family this summer  OT Frequency:     Barriers to D/C:            Co-evaluation              AM-PAC OT "6 Clicks" Daily Activity     Outcome Measure Help from another person eating meals?: None Help from another person taking care of personal grooming?: None Help from another person toileting, which includes using toliet, bedpan, or urinal?: None Help from another person bathing (including washing, rinsing, drying)?: A Little Help from another person to put on and taking off regular upper body clothing?: None Help from another person to put on and taking off regular lower body clothing?: None 6 Click Score: 23   End of Session Equipment Utilized During Treatment: Gait belt Nurse Communication: Mobility status  Activity Tolerance: Patient tolerated treatment well Patient left: in chair;with call bell/phone within reach  OT Visit Diagnosis: Unsteadiness on feet (R26.81);Muscle weakness (generalized) (M62.81)                Time: 5638-9373 OT Time Calculation (min): 15 min Charges:  OT General Charges $OT Visit: 1 Visit OT Evaluation $OT Eval Low Complexity: 1 Low  Brett Bright, OTR/L [facility] [phone]  Brett Bright 08/20/2019, 5:05 PM

## 2019-08-20 NOTE — Progress Notes (Signed)
PROGRESS NOTE    Brett Bright  UYQ:[id number] DOB: [birth year] DOA: 08/13/2019 PCP: Patient, No Pcp Per   Chief Complaint  Patient presents with   Emesis    Brief Narrative:  HPI per Dr. Abigail Miyamoto is a 59 y.o. male with medical history of Ulcerative colitis, upper GI bleed, portal vein thrombus, GERD presenting with over 1 week history of nausea, vomiting, and diarrhea.  Patient was recently admitted to the hospital from 07/19/2019 to 07/20/2019 with ulcerative colitis flare.  He was discharged home with a prednisone taper.  He ultimately followed up with his gastroenterologist on 08/01/2019.  He has Lialda was increased to 4.8 g daily, and he was instructed to continue on his prednisone taper.  The patient states that he has tapered himself down to 10 mg of prednisone.  He began his 10 mg dose on 08/10/2019.  However, the patient has continued to have worsening nausea, vomiting, and diarrhea in the past week.  He denies any hematemesis, hematochezia, melena, fevers, chills.  He states that he has never had any abdominal pain even with his initial diagnosis of ulcerative colitis.  He states that he does not have any abdominal pain at this point.  He denies any fevers, chills, headache, chest pain, shortness breath, coughing, hemoptysis, hematemesis, dysuria, hematuria.  He denies any new medications.  He does not drink any alcohol or take any NSAIDs. In the emergency department, the patient has been afebrile and hemodynamically stable with oxygen saturation 96-100% on room air.  WBC was initially 14.9 with hemoglobin 11.4, and platelets 275,000.  Initial sodium was 127 with serum creatinine 0.79.  CT of the abdomen and pelvis showed Gas and fluid in the LEFT retroperitoneum and extending into peritoneal spaces via mesenteric pathways in the LEFT abdomen, associated with interloop fluid and gas as well as loculated collections in the mesorectum.  Because of the extensive distribution,  septic thrombophlebitis was favored associated with local colonic perforation.  There was gas noted along the IMV at the site of the previous thrombus into the left retroperitoneum. Initial contact was made with [facility] whom had accepted the patient in transfer.  However the patient continued to wait in the emergency department for over 24 hours.  Subsequently, general surgery, Dr. Aviva Signs was contacted.  He recommended transfer to a higher level of care.  He ultimately spoke with general surgery, Dr. Jaymes Graff, at [facility] and the plan is to transfer the patient to [facility] for further surgical evaluation.  The patient was started on intravenous Zosyn and IV fluids.  Assessment & Plan:   Principal Problem:   Colon perforation ([place]) Active Problems:   Hyponatremia   Hypokalemia   Nausea vomiting and diarrhea   Ulcerative colitis (HCC)   Iron deficiency anemia due to chronic blood loss   Dehydration  1 concern for peritonitis secondary to perforated colon/intraabdominal fluid collection As noted on CT abdomen and pelvis.  Patient currently afebrile.  Normal white count.  Patient denies any abdominal pain.  Nausea vomiting diarrhea improving.  Patient initially seen by Dr. Arnoldo Morale, general surgery at [facility] who discussed case with general surgery at [facility] and patient transferred to [facility] for further surgical evaluation.  Patient seen in consultation by general surgery this morning who feel patient is not toxic appearing and recommending management patient's ulcerative colitis.  Patient currently tolerating full liquids.  Patient status post aspiration and drain placement of intra-abdominal fluid  collection by IR, 08/16/2019 with cultures pending.  IV Solu-Medrol discontinued per GI. Per general surgery no surgical needs at this time.  Patient started on TPN.   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  Wall thickening throughout sigmoid colon and rectum improved.  Stable exophytic hypodense 2.2 cm renal cortical lesion in the anterior lower right kidney, indeterminate.  MRI abdomen without and with IV contrast recommended in the short-term outpatient basis for further characterization.  Small hiatial hernia.  Continue IV Zosyn. GI and general surgery following and appreciate input and recommendations.   2.  Ulcerative colitis  Patient states some improvement with nausea and emesis.  Abdominal pain improved.  Loose stools improved.  CRP elevated at 10.1.  Sed rate elevated at 70.  Currently tolerating clear liquids.  Patient was on IV Solu-Medrol which was discontinued 08/15/2019 per GI.  Continue IV Zosyn. Patient seen and being followed by GI. Patient status post aspiration and drainage of intra-abdominal fluid collection 08/16/2019 per Dr. Earleen Newport with purulent fluid noted in drain.  Intra-abdominal fluid cultures pending.  Patient on TPN per GI recommendations. GI following and I appreciate the input and recommendations.  3.  Hyponatremia Likely secondary to hypovolemic hyponatremia.  Improved with hydration.  Follow.    4.  Hypokalemia Repleted.  Potassium at 4.3.  Magnesium at 2.  Electrolytes being repleted per pharmacy via TPN.  Follow.    5.  Iron deficiency anemia anemia, unknown/folate deficiency. Likely secondary to problem #2.  Anemia panel consistent with anemia of chronic disease.  Folic acid level of 5.1.  Hemoglobin at 9.8.  Continue folate  supplementation.  6.  Severe protein calorie malnutrition TPN.  Tolerating full liquid diet.  Follow.     DVT prophylaxis: SCDs Code Status full Family Communication: Updated patient.  No family at bedside. Disposition:   Status is: Inpatient    Dispo: The patient is from: Home              Anticipated d/c is to: Home              Anticipated d/c date is: To be determined.              Patient currently on IV antibiotics, status post CT-guided drain placement to abdominal abscess, being followed by general surgery and GI due to ulcerative colitis and concern for peritonitis.  Not ready for discharge.        Consultants:   General surgery: Dr. Olena Leatherwood [facility] 08/13/2019  General surgery: Dr. Nyoka Cowden [facility] 08/15/2019  Gastroenterology: Dr. Rush Landmark 08/15/2019  Interventional radiology: Dr. Earleen Newport  Procedures:   CT abdomen and pelvis 08/13/2019, 08/20/2019  Drainage of left abdominal abscess with 10 French drain per IR/Dr. Earleen Newport 08/16/2019  Antimicrobials:   IV Zosyn 08/13/2019   Subjective: Patient sitting up in chair.  Tolerating full liquid diet.  Denies chest pain or shortness of breath.  No significant abdominal pain.  Feeling better overall.  Objective: Vitals:   08/19/19 0449 08/19/19 1538 08/19/19 2059 08/20/19 0437  BP: 106/66 102/78 109/70 108/71  Pulse: 98 (!) 105 (!) 104  91  Resp: 18 17 19 17   Temp: 98.3 F (36.8 C) 98.2 F (36.8 C) 99.3 F (37.4 C) 98.8 F (37.1 C)  TempSrc: Oral Oral Oral Oral  SpO2: 97% 98% 97% 94%  Weight:      Height:        Intake/Output Summary (Last 24 hours) at 08/20/2019 1243 Last data filed at 08/20/2019 0955 Gross per 24 hour  Intake 2106.21 ml  Output 42 ml  Net 2064.21 ml   Filed Weights   08/13/19 0549 08/14/19 1959  Weight: 92.5 kg 92.5 kg    Examination:  General exam: NAD Respiratory system: CTA B.  No wheezes, no crackles, no rhonchi.  Normal respiratory effort.   Cardiovascular system: Regular rate rhythm no murmurs rubs or gallops.  No JVD.  No lower extremity edema. Gastrointestinal system: Abdomen is soft, nontender, nondistended, positive bowel sounds.  No rebound.  No guarding.  Drain with purulent drainage in bulb.  Central nervous system: Alert and oriented. No focal neurological deficits. Extremities: Symmetric 5 x 5 power. Skin: No rashes, lesions or ulcers Psychiatry: Judgement and insight appear normal. Mood & affect appropriate.     Data Reviewed: I have personally reviewed following labs and imaging studies  CBC: Recent Labs  Lab 08/14/19 0701 08/15/19 0230 08/16/19 0203 08/17/19 0434 08/20/19 0409  WBC 8.6 4.2 5.1 6.9 4.5  NEUTROABS  --   --  3.7 6.0 3.2  HGB 9.7* 10.0* 9.8* 10.4* 9.8*  HCT 31.2* 31.9* 31.4* 33.2* 31.6*  MCV 86.9 85.3 85.3 85.8 86.3  PLT 275 238 269 311 093    Basic Metabolic Panel: Recent Labs  Lab 08/16/19 0203 08/17/19 0434 08/18/19 0434 08/19/19 0427 08/20/19 0409  NA 135 132* 132* 133* 134*  K 3.6 3.6 3.7 4.0 4.3  CL 104 101 101 103 101  CO2 24 22 23 25 26   GLUCOSE 113* 121* 120* 131* 127*  BUN 8 7 7 7 8   CREATININE 0.72 0.69 0.59* 0.55* 0.56*  CALCIUM 7.7* 7.6* 7.7* 7.8* 8.1*  MG 2.0 1.7 1.8 2.1 2.0  PHOS 3.2 3.6 2.6 2.8 3.3    GFR: Estimated Creatinine Clearance: 113 mL/min (A) (by C-G formula based on SCr of 0.56 mg/dL (L)).  Liver Function Tests: Recent Labs  Lab 08/15/19 0230 08/16/19 0203 08/17/19 0434 08/20/19 0409  AST 38  --  25 97*  ALT 71*  --  47* 127*  ALKPHOS 49  --  48 46  BILITOT 0.4  --  0.4 0.5  PROT 6.0*  --  5.7* 6.4*  ALBUMIN 1.5* 1.5* 1.6* 1.8*    CBG: Recent Labs  Lab 08/19/19 1215 08/19/19 1755 08/19/19 2355 08/20/19 0539 08/20/19 1152  GLUCAP 137* 110* 141* 123* 124*     Recent Results (from the past 240 hour(s))  Culture, blood (routine x 2)     Status: None   Collection Time: 08/13/19 10:03 AM   Specimen: BLOOD RIGHT HAND  Result  Value Ref Range Status   Specimen Description BLOOD RIGHT HAND  Final   Special Requests   Final    BOTTLES DRAWN AEROBIC AND ANAEROBIC Blood Culture adequate volume   Culture   Final    NO GROWTH 5 DAYS Performed at [facility], [address], [place], [place] [address]    Report Status 08/18/2019 FINAL  Final  Culture, blood (routine x 2)     Status: None   Collection Time: 08/13/19 10:08 AM   Specimen: BLOOD LEFT HAND  Result Value Ref Range Status   Specimen Description BLOOD LEFT HAND  Final   Special Requests   Final    BOTTLES DRAWN AEROBIC AND ANAEROBIC Blood Culture adequate volume   Culture   Final    NO GROWTH 5 DAYS Performed at [facility], [address], [place], [place] [address]    Report Status 08/18/2019 FINAL  Final  SARS Coronavirus 2 by RT PCR (hospital order, performed in [facility] hospital lab) Nasopharyngeal Nasopharyngeal Swab     Status: None   Collection Time: 08/13/19 11:33 AM   Specimen: Nasopharyngeal Swab  Result Value Ref Range Status   SARS Coronavirus 2 NEGATIVE NEGATIVE Final    Comment: (NOTE) SARS-CoV-2 target nucleic acids are NOT DETECTED. The SARS-CoV-2 RNA is generally detectable in upper and lower respiratory specimens during the acute phase of infection. The lowest concentration of SARS-CoV-2 viral copies this assay can detect is 250 copies / mL. A negative result does not preclude SARS-CoV-2 infection and should not be used as the sole basis for treatment or other patient management decisions.  A negative result may occur with improper specimen collection / handling, submission of specimen other than nasopharyngeal swab, presence of viral mutation(s) within the areas targeted by this assay, and inadequate number of viral copies (<250 copies / mL). A negative result must be combined with clinical observations, patient history, and epidemiological information. Fact Sheet for Patients:    StrictlyIdeas.no Fact Sheet for Healthcare Providers: BankingDealers.co.za This test is not yet approved or cleared  by the [place] FDA and has been authorized for detection and/or diagnosis of SARS-CoV-2 by FDA under an Emergency Use Authorization (EUA).  This EUA will remain in effect (meaning this test can be used) for the duration of the COVID-19 declaration under Section 564(b)(1) of the Act, 21 U.S.C. section 360bbb-3(b)(1), unless the authorization is terminated or revoked sooner. Performed at [facility], [address] [place], [place] [address]   Aerobic/Anaerobic Culture (surgical/deep wound)     Status: None   Collection Time: 08/16/19  2:27 PM   Specimen: Abscess  Result Value Ref Range Status   Specimen Description ABSCESS  Final   Special Requests NONE  Final   Gram Stain   Final    ABUNDANT WBC PRESENT, PREDOMINANTLY PMN ABUNDANT GRAM NEGATIVE RODS ABUNDANT GRAM POSITIVE COCCI IN PAIRS IN CHAINS MODERATE GRAM POSITIVE RODS Performed at [facility], [address], [place], [place] [address]    Culture   Final    RARE ESCHERICHIA COLI WITH IN MIXED CULTURE MIXED ANAEROBIC FLORA PRESENT.  CALL LAB IF FURTHER IID REQUIRED.    Report Status 08/20/2019 FINAL  Final   Organism ID, Bacteria ESCHERICHIA COLI  Final      Susceptibility   Escherichia coli - MIC*    AMPICILLIN <=2 SENSITIVE Sensitive     CEFAZOLIN <=4 SENSITIVE Sensitive     CEFEPIME <=1 SENSITIVE Sensitive     CEFTAZIDIME <=1 SENSITIVE Sensitive     CEFTRIAXONE <=1 SENSITIVE Sensitive     CIPROFLOXACIN <=0.25 SENSITIVE Sensitive     GENTAMICIN 4 SENSITIVE Sensitive     IMIPENEM <=0.25 SENSITIVE Sensitive     TRIMETH/SULFA >=320 RESISTANT Resistant     AMPICILLIN/SULBACTAM <=2 SENSITIVE Sensitive     PIP/TAZO <=4 SENSITIVE Sensitive     * RARE ESCHERICHIA COLI         Radiology Studies: CT ABDOMEN PELVIS W CONTRAST  Result Date:  08/20/2019 CLINICAL DATA:  Inpatient.  Ulcerative colitis. Interval percutaneous left abdominal abscess drainage. EXAM: CT ABDOMEN AND PELVIS WITH CONTRAST TECHNIQUE: Multidetector CT imaging of the abdomen and pelvis was performed using the standard protocol following bolus administration of intravenous contrast. CONTRAST:  173m OMNIPAQUE IOHEXOL 300 MG/ML  SOLN COMPARISON:  08/13/2019 CT abdomen/pelvis. FINDINGS: Lower chest: No significant pulmonary nodules or acute consolidative airspace disease. Hepatobiliary: Normal liver size. No discrete liver mass. Cholecystectomy. No biliary ductal dilatation. Pancreas: Normal, with no mass or duct dilation. Spleen: Normal size. No mass. Adrenals/Urinary Tract: Normal adrenals. No hydronephrosis. Exophytic hyperdense 2.2 cm renal cortical lesion in the anterior lower right kidney (series 3/image 48), unchanged. Numerous simple renal cysts in both kidneys, largest 11.9 cm in the upper right kidney. Several subcentimeter hypodense renal cortical lesions in both kidneys are too small to characterize. Normal bladder. Stomach/Bowel: Small hiatal hernia. Otherwise normal nondistended stomach. Normal caliber small bowel. Appendectomy. Scattered mild colonic diverticula. Wall thickening throughout sigmoid colon and rectum, improved. Interval placement of percutaneous pigtail drain in the complex left abdominal collection, which is significantly decreased, with no residual measurable collection surrounding the catheter. Mild reactive wall thickening in the left small bowel is improved. Residual tiny 2.6 x 1.5 cm posterior left peritoneal collection (series 3/image 52), decreased from 9.6 x 5.5 cm. 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 Persistent ill-defined fat  stranding and phlegmonous change in the left small bowel mesentery, significantly improved (series 3/image 65). No new collections. Vascular/Lymphatic: Normal caliber abdominal aorta. Patent portal, splenic, hepatic and renal veins. No pathologically enlarged lymph nodes in the abdomen or pelvis. Reproductive: Normal size prostate.  No ascites. Other: No pneumoperitoneum, ascites or focal fluid collection. Musculoskeletal: No aggressive appearing focal osseous lesions. Moderate lumbar spondylosis. IMPRESSION: 1. Significant interval reduction in size of complex left abdominal abscess status post percutaneous pigtail drain placement, as detailed. 2. Persistent ill-defined fat stranding and phlegmonous change in the left small bowel mesentery, significantly improved. No new collections. 3. 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 4. Reactive left small bowel thickening is improved. Wall thickening throughout the sigmoid colon and rectum is improved. 5. Stable exophytic hyperdense 2.2 cm renal cortical lesion in the anterior lower right kidney, indeterminate. MRI abdomen without and with IV contrast is recommended on a short term outpatient basis for further characterization. 6. Small hiatal hernia. Electronically Signed   By: JIlona SorrelM.D.   On: 08/20/2019 08:25        Scheduled Meds:  Chlorhexidine Gluconate Cloth  6 each Topical Daily   enoxaparin (LOVENOX) injection  40 mg Subcutaneous Daily   feeding supplement  1 Container Oral TID BM   insulin aspart  0-15 Units Subcutaneous Q6H   sodium chloride flush  10-40 mL Intracatheter Q12H   sodium chloride flush  5 mL Intracatheter Q8H   Continuous Infusions:  piperacillin-tazobactam (ZOSYN)  IV 3.375 g (08/20/19 0540)   TPN ADULT (ION) 90 mL/hr at 08/19/19 1807   TPN ADULT (ION)       LOS: 6 days    Time  spent: 35 minutes    DIrine Seal MD [facility]   To contact the attending provider between 7A-7P or the covering provider during after hours 7P-7A, please log into  the web site [email or web address] and access using universal [place] password for that web site. If you do not have the password, please call the hospital operator.  08/20/2019, 12:43 PM

## 2019-08-20 NOTE — Plan of Care (Signed)
  Problem: Nutrition: Goal: Adequate nutrition will be maintained Outcome: Progressing   Problem: Elimination: Goal: Will not experience complications related to bowel motility Outcome: Progressing   Problem: Pain Managment: Goal: General experience of comfort will improve Outcome: Progressing   Problem: Activity: Goal: Risk for activity intolerance will decrease Outcome: Progressing

## 2019-08-21 DIAGNOSIS — K632 Fistula of intestine: Secondary | ICD-10-CM

## 2019-08-21 DIAGNOSIS — K5100 Ulcerative (chronic) pancolitis without complications: Secondary | ICD-10-CM

## 2019-08-21 DIAGNOSIS — I81 Portal vein thrombosis: Secondary | ICD-10-CM

## 2019-08-21 DIAGNOSIS — K5792 Diverticulitis of intestine, part unspecified, without perforation or abscess without bleeding: Secondary | ICD-10-CM

## 2019-08-21 LAB — COMPREHENSIVE METABOLIC PANEL
ALT: 145 U/L — ABNORMAL HIGH (ref 0–44)
AST: 87 U/L — ABNORMAL HIGH (ref 15–41)
Albumin: 1.9 g/dL — ABNORMAL LOW (ref 3.5–5.0)
Alkaline Phosphatase: 50 U/L (ref 38–126)
Anion gap: 7 (ref 5–15)
BUN: 8 mg/dL (ref 6–20)
CO2: 25 mmol/L (ref 22–32)
Calcium: 8.2 mg/dL — ABNORMAL LOW (ref 8.9–10.3)
Chloride: 101 mmol/L (ref 98–111)
Creatinine, Ser: 0.61 mg/dL (ref 0.61–1.24)
GFR calc Af Amer: >60 mL/min (ref >60)
GFR calc non Af Amer: >60 mL/min (ref >60)
Glucose, Bld: 134 mg/dL — ABNORMAL HIGH (ref 70–99)
Potassium: 4.3 mmol/L (ref 3.5–5.1)
Sodium: 133 mmol/L — ABNORMAL LOW (ref 135–145)
Total Bilirubin: 0.2 mg/dL — ABNORMAL LOW (ref 0.3–1.2)
Total Protein: 6.6 g/dL (ref 6.5–8.1)

## 2019-08-21 LAB — MAGNESIUM: Magnesium: 2.2 mg/dL (ref 1.7–2.4)

## 2019-08-21 LAB — GLUCOSE, CAPILLARY
Glucose-Capillary: 120 mg/dL — ABNORMAL HIGH (ref 70–99)
Glucose-Capillary: 127 mg/dL — ABNORMAL HIGH (ref 70–99)
Glucose-Capillary: 129 mg/dL — ABNORMAL HIGH (ref 70–99)
Glucose-Capillary: 133 mg/dL — ABNORMAL HIGH (ref 70–99)
Glucose-Capillary: 141 mg/dL — ABNORMAL HIGH (ref 70–99)

## 2019-08-21 LAB — PROTIME-INR
INR: 1.1 (ref 0.8–1.2)
Prothrombin Time: 13.6 seconds (ref 11.4–15.2)

## 2019-08-21 LAB — PHOSPHORUS: Phosphorus: 3.8 mg/dL (ref 2.5–4.6)

## 2019-08-21 MED ORDER — INSULIN ASPART 100 UNIT/ML ~~LOC~~ SOLN
0.0000 [IU] | SUBCUTANEOUS | Status: DC
  Administered 2019-08-21 – 2019-08-22 (×3): 1 [IU] via SUBCUTANEOUS

## 2019-08-21 MED ORDER — TRAVASOL 10 % IV SOLN
INTRAVENOUS | Status: AC
  Filled 2019-08-21: qty 1209.6

## 2019-08-21 MED ORDER — OXYCODONE HCL 5 MG PO TABS: 5.0000 mg | ORAL_TABLET | ORAL | Status: DC | PRN

## 2019-08-21 NOTE — Progress Notes (Signed)
Referring Physician(s): Dr Rockne Coons  Supervising Physician: Daryll Brod  Patient Status:  [name] - In-pt  Chief Complaint:  LLQ drain placed in IR 5/27   Subjective:  Left perirectal and retroperitoneal/LUQ fluid collections, suspect secondary to descending colonic perforation  Up in chair Better daily No complaints 50 cc OP yesterday  Allergies: Patient has no known allergies.  Medications: Prior to Admission medications   Medication Sig Start Date End Date Taking? Authorizing Provider  acetaminophen (TYLENOL) 325 MG tablet Take 325 mg by mouth every 6 (six) hours as needed for fever.   Yes [provider]  Ascorbic Acid (VITAMIN C PO) Take 1 tablet by mouth daily. Drops daily    Yes [provider]  ferrous sulfate (FEROSUL) 325 (65 FE) MG tablet Take 1 tablet (325 mg total) by mouth daily with breakfast. 07/20/19  Yes Johnson, Clanford L, MD  mesalamine (LIALDA) 1.2 g EC tablet Take 4 tablets (4.8 g total) by mouth daily with breakfast. 07/20/19  Yes Johnson, Clanford L, MD  omeprazole (PRILOSEC) 20 MG capsule Take 20 mg by mouth daily.   Yes [provider]  predniSONE (DELTASONE) 10 MG tablet Take 4 PO QAM x7days, 3 PO QAM x7days, 2 PO QAM x7days, 1 po QAM x 7 Patient taking differently: Take 10-40 mg by mouth See admin instructions. Take 40 mg every morning for 7 days, then 30 mg every morning for 7 days, then 20 mg every morning for 7 days,  then 10 mg every morning for 7 days, then stop. 07/21/19  Yes Johnson, Clanford L, MD     Vital Signs: BP 104/75 (BP Location: Left Arm)   Pulse 92   Temp 98.5 F (36.9 C) (Oral)   Resp 18   Ht 5' 9"  (1.753 m)   Wt 203 lb 14.8 oz (92.5 kg)   SpO2 97%   BMI 30.11 kg/m   Physical Exam Skin:    General: Skin is warm and dry.     Comments: Site is clean and dry NT no bleeding OP milky yellow 50 cc yesterday; 20 cc in JP  08/20/2019 FINAL  Organism ID, Bacteria ESCHERICHIA COLI         Imaging: CT ABDOMEN PELVIS W CONTRAST  Result Date: 08/20/2019 CLINICAL DATA:  Inpatient. Ulcerative colitis. Interval percutaneous left abdominal abscess drainage. EXAM: CT ABDOMEN AND PELVIS WITH CONTRAST TECHNIQUE: Multidetector CT imaging of the abdomen and pelvis was performed using the standard protocol following bolus administration of intravenous contrast. CONTRAST:  161m OMNIPAQUE IOHEXOL 300 MG/ML  SOLN COMPARISON:  08/13/2019 CT abdomen/pelvis. FINDINGS: Lower chest: No significant pulmonary nodules or acute consolidative airspace disease. Hepatobiliary: Normal liver size. No discrete liver mass. Cholecystectomy. No biliary ductal dilatation. Pancreas: Normal, with no mass or duct dilation. Spleen: Normal size. No mass. Adrenals/Urinary Tract: Normal adrenals. No hydronephrosis. Exophytic hyperdense 2.2 cm renal cortical lesion in the anterior lower right kidney (series 3/image 48), unchanged. Numerous simple renal cysts in both kidneys, largest 11.9 cm in the upper right kidney. Several subcentimeter hypodense renal cortical lesions in both kidneys are too small to characterize. Normal bladder. Stomach/Bowel: Small hiatal hernia. Otherwise normal nondistended stomach. Normal caliber small bowel. Appendectomy. Scattered mild colonic diverticula. Wall thickening throughout sigmoid colon and rectum, improved. Interval placement of percutaneous pigtail drain in the complex left abdominal collection, which is significantly decreased, with no residual measurable collection surrounding the catheter. Mild reactive wall thickening in the left small bowel is improved. Residual tiny 2.6  x 1.5 cm posterior left peritoneal collection (series 3/image 52), decreased from 9.6 x 5.5 cm. 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 Persistent ill-defined fat stranding and phlegmonous change in the left small bowel mesentery, significantly improved (series 3/image 65). No new collections. Vascular/Lymphatic: Normal caliber abdominal aorta. Patent portal, splenic, hepatic and renal veins. No pathologically enlarged lymph nodes in the abdomen or pelvis. Reproductive: Normal size prostate.  No ascites. Other: No pneumoperitoneum, ascites or focal fluid collection. Musculoskeletal: No aggressive appearing focal osseous lesions. Moderate lumbar spondylosis. IMPRESSION: 1. Significant interval reduction in size of complex left abdominal abscess status post percutaneous pigtail drain placement, as detailed. 2. Persistent ill-defined fat stranding and phlegmonous change in the left small bowel mesentery, significantly improved. No new collections. 3. 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 4. Reactive left small bowel thickening is improved. Wall thickening throughout the sigmoid colon and rectum is improved. 5. Stable exophytic hyperdense 2.2 cm renal cortical lesion in the anterior lower right kidney, indeterminate. MRI abdomen without and with IV contrast is recommended on a short term outpatient basis for further characterization. 6. Small hiatal hernia. Electronically Signed   By: Ilona Sorrel M.D.   On: 08/20/2019 08:25    Labs:  CBC: Recent Labs    08/15/19 0230 08/16/19 0203 08/17/19 0434 08/20/19 0409  WBC 4.2 5.1 6.9 4.5  HGB 10.0* 9.8* 10.4* 9.8*  HCT 31.9* 31.4* 33.2* 31.6*  PLT 238 269 311 276    COAGS: Recent Labs    08/16/19 0203 08/21/19 0401  INR 1.2 1.1    BMP: Recent Labs    08/18/19 0434 08/19/19 0427 08/20/19 0409 08/21/19 0401  NA 132* 133* 134* 133*  K 3.7 4.0 4.3 4.3  CL 101 103 101 101  CO2 23 25 26 25   GLUCOSE 120* 131*  127* 134*  BUN 7 7 8 8   CALCIUM 7.7* 7.8* 8.1* 8.2*  CREATININE 0.59* 0.55* 0.56* 0.61  GFRNONAA >60 >60 >60 >60  GFRAA >60 >60 >60 >60    LIVER FUNCTION TESTS: Recent Labs    08/15/19 0230 08/15/19 0230 08/16/19 0203 08/17/19 0434 08/20/19 0409 08/21/19 0401  BILITOT 0.4  --   --  0.4 0.5 0.2*  AST 38  --   --  25 97* 87*  ALT 71*  --   --  47* 127* 145*  ALKPHOS 49  --   --  48 46 50  PROT 6.0*  --   --  5.7* 6.4* 6.6  ALBUMIN 1.5*   < > 1.5* 1.6* 1.8* 1.9*   < > = values in this interval not displayed.    Assessment and Plan:  LLQ drain intact Will follow Flushes easily OP milky yellow--- when OP 10 cc or less-- re CT  If home with drain-- will hear from [facility] for follow up Will need flushed daily 5-10 cc sterile saline Record OP as outpatient   Electronically Signed: Lavonia Drafts, PA-C 08/21/2019, 11:45 AM   I spent a total of 15 Minutes at the the patient's bedside AND on the patient's hospital floor or unit, greater than 50% of which was counseling/coordinating care for LLQ abscess drain

## 2019-08-21 NOTE — Progress Notes (Addendum)
Daily Rounding Note  08/21/2019, 1:08 PM  LOS: 7 days   SUBJECTIVE:   Chief complaint: colon perf w abscess/fluid collections.        LLQ abscess drain put out 50 mL fluid yesterday.  Fluid growing E. Coli. Radiology recommends this staying in place and they will patient up as outpatient.  Needs to be flushed with sterile saline and fluid output recorded on a daily basis.  No abd pain.  Tolerating FL diet, no nausea.  Small liquid to soft/umformed brown stools (better than the diarrhea at admission)   OBJECTIVE:         Vital signs in last 24 hours:    Temp:  [98 F (36.7 C)-99.3 F (37.4 C)] 98.5 F (36.9 C) (06/01 0549) Pulse Rate:  [92-106] 92 (06/01 0549) Resp:  [18] 18 (06/01 0549) BP: (104-111)/(67-75) 104/75 (06/01 0549) SpO2:  [97 %] 97 % (06/01 0549) Last BM Date: 08/19/19 Filed Weights   08/13/19 0549 08/14/19 1959  Weight: 92.5 kg 92.5 kg   General: Looks well.  Alert, comfortable, not ill looking Heart: RRR Chest: Clear bilaterally.  No labored breathing or cough Abdomen: Nontender, nondistended, active bowel sounds.  Scant slightly opaque whitish fluid in JP drain on the left.. Extremities: No CCE Neuro/Psych: Pleasant, calm, cooperative.  No gross deficits, tremors, weakness  Intake/Output from previous day: 05/31 0701 - 06/01 0700 In: 2861.5 [P.O.:1520; I.V.:1177.2; IV Piggyback:149.2] Out: 50 [Drains:50]  Intake/Output this shift: Total I/O In: 360 [P.O.:360] Out: 10 [Drains:10]  Lab Results: Recent Labs    08/20/19 0409  WBC 4.5  HGB 9.8*  HCT 31.6*  PLT 276   BMET Recent Labs    08/19/19 0427 08/20/19 0409 08/21/19 0401  NA 133* 134* 133*  K 4.0 4.3 4.3  CL 103 101 101  CO2 25 26 25   GLUCOSE 131* 127* 134*  BUN 7 8 8   CREATININE 0.55* 0.56* 0.61  CALCIUM 7.8* 8.1* 8.2*   LFT Recent Labs    08/20/19 0409 08/21/19 0401  PROT 6.4* 6.6  ALBUMIN 1.8* 1.9*  AST 97* 87*    ALT 127* 145*  ALKPHOS 46 50  BILITOT 0.5 0.2*   PT/INR Recent Labs    08/21/19 0401  LABPROT 13.6  INR 1.1   Hepatitis Panel No results for input(s): HEPBSAG, HCVAB, HEPAIGM, HEPBIGM in the last 72 hours.  Studies/Results: CT ABDOMEN PELVIS W CONTRAST  Result Date: 08/20/2019 CLINICAL DATA:  Inpatient. Ulcerative colitis. Interval percutaneous left abdominal abscess drainage. EXAM: CT ABDOMEN AND PELVIS WITH CONTRAST TECHNIQUE: Multidetector CT imaging of the abdomen and pelvis was performed using the standard protocol following bolus administration of intravenous contrast. CONTRAST:  13m OMNIPAQUE IOHEXOL 300 MG/ML  SOLN COMPARISON:  08/13/2019 CT abdomen/pelvis. FINDINGS: Lower chest: No significant pulmonary nodules or acute consolidative airspace disease. Hepatobiliary: Normal liver size. No discrete liver mass. Cholecystectomy. No biliary ductal dilatation. Pancreas: Normal, with no mass or duct dilation. Spleen: Normal size. No mass. Adrenals/Urinary Tract: Normal adrenals. No hydronephrosis. Exophytic hyperdense 2.2 cm renal cortical lesion in the anterior lower right kidney (series 3/image 48), unchanged. Numerous simple renal cysts in both kidneys, largest 11.9 cm in the upper right kidney. Several subcentimeter hypodense renal cortical lesions in both kidneys are too small to characterize. Normal bladder. Stomach/Bowel: Small hiatal hernia. Otherwise normal nondistended stomach. Normal caliber small bowel. Appendectomy. Scattered mild colonic diverticula. Wall thickening throughout sigmoid colon and rectum, improved. Interval placement of percutaneous  pigtail drain in the complex left abdominal collection, which is significantly decreased, with no residual measurable collection surrounding the catheter. Mild reactive wall thickening in the left small bowel is improved. Residual tiny 2.6 x 1.5 cm posterior left peritoneal collection (series 3/image 52), decreased from 9.6 x 5.5 cm.  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 Persistent ill-defined fat stranding and phlegmonous change in the left small bowel mesentery, significantly improved (series 3/image 65). No new collections. Vascular/Lymphatic: Normal caliber abdominal aorta. Patent portal, splenic, hepatic and renal veins. No pathologically enlarged lymph nodes in the abdomen or pelvis. Reproductive: Normal size prostate.  No ascites. Other: No pneumoperitoneum, ascites or focal fluid collection. Musculoskeletal: No aggressive appearing focal osseous lesions. Moderate lumbar spondylosis. IMPRESSION: 1. Significant interval reduction in size of complex left abdominal abscess status post percutaneous pigtail drain placement, as detailed. 2. Persistent ill-defined fat stranding and phlegmonous change in the left small bowel mesentery, significantly improved. No new collections. 3. 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 4. Reactive left small bowel thickening is improved. Wall thickening throughout the sigmoid colon and rectum is improved. 5. Stable exophytic hyperdense 2.2 cm renal cortical lesion in the anterior lower right kidney, indeterminate. MRI abdomen without and with IV contrast is recommended on a short term outpatient basis for further characterization. 6. Small hiatal hernia. Electronically Signed   By: Ilona Sorrel M.D.   On: 08/20/2019 08:25   Scheduled Meds: . Chlorhexidine Gluconate Cloth  6 each Topical Daily  . enoxaparin (LOVENOX) injection  40 mg Subcutaneous Daily  . insulin aspart  0-9 Units Subcutaneous 3 times per day  . sodium chloride flush  10-40 mL Intracatheter  Q12H  . sodium chloride flush  5 mL Intracatheter Q8H   Continuous Infusions: . piperacillin-tazobactam (ZOSYN)  IV 12.5 mL/hr at 08/21/19 4098  . TPN ADULT (ION) 90 mL/hr at 08/21/19 1191  . TPN ADULT (ION)     PRN Meds:.acetaminophen **OR** acetaminophen, morphine, ondansetron **OR** ondansetron (ZOFRAN) IV, oxyCODONE, sodium chloride flush  ASSESMENT:   *    Perforated colon with intra-abdominal, retroperitoneal abscesses/fluid collections.  Drain placed to fluid collection in LLQ last week. Latest CT of 5/31 shows significant reduction in size of the LLQ abscess following drain placement.  There is some persistent but improved fat stranding, phlegmon in left small bowel mesenteric.  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  Reactive small bowel inflammation, and rectal inflammation also improved. GI, Dr. Rush Landmark suspects diverticulitis was initiating event leading to perf, abscesses, fistula and UC was not the cause/    *    Protein malnutrition, moderate, on TPN since 5/21. Pre-albumin improving.    *    IMV thrombosis?, previous portal thrombosis.  SQ Lovenox in place.  No anti-coagulant meds in past.    *   Elevated LFTs.  Suspect TPN contributing but AST/ALT mildly elevated as far back as 03/2016.  On CT of 4/29 and 5/24 had mildly heterogeneous appearance of the anterior posterior division superior aspect RIGHT hemi liver likely related to prior portal thrombosis  Liver unremarkable on yesterday's CT  *    Indeterminant 2.2 cm exophytic lesion lower right kidney.  MRI recommended for further evaluation.  PLAN   *  Has GI fup in [place] set for 8/18.  Called [facility] and pt added to their cancellation list so likely will be seen sooner.  No colonoscopy until after GI fup.    *   Advance diet to soft.  Hopefully can wean off the TPN soon.     *   ? Restart Lialda, taking 4.8 gm daily PTA.  Will d/w Dr Silverio Decamp.    *    MR abdomen to wup the renal lesion??  Defer to Dr Grandville Silos, hospitalist.    *   ? Will he need long/intermediate term anticoagulation therapy for ?IMV thrombosis?     Azucena Freed  08/21/2019, 1:08 PM Phone [phone]   Attending physician's note   I have taken an interval history, reviewed the chart and examined the patient. I agree with the Advanced Practitioner's note, impression and recommendations.   Colonic perforation likely secondary to complicated acute diverticulitis  Less likely to be complication from IBD as he is currently not on any treatment, clinically improving with antibiotics, bowel rest with TPN and drain  History of ulcerative colitis, continue to hold Lialda  Patient is interested in following with [facility], he does not want to follow-up will need to arrange follow-up appointment with Dr. Elita Boone in 2 to 4 weeks after discharge.  He does not want to follow-up with [facility]   Will need follow-up imaging to document resolution of intra-abdominal abscesses, IR follow-up to remove the drains  Advance diet as tolerated.  Continue TPN If he is unable to meet his nutritional requirement, will need to plan to discharge him home on TPN  He will need to stay on anticoagulation for 6 months for IMV thrombus  Will need colonoscopy as outpatient in 6 to 8 weeks once clinical course improves to exclude IBD.  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 Denzil Magnuson , MD [phone]

## 2019-08-21 NOTE — Progress Notes (Signed)
PHARMACY - TOTAL PARENTERAL NUTRITION CONSULT NOTE   Indication: severe malnutrition  Patient Measurements: Height: 5' 9"  (175.3 cm) Weight: 92.5 kg (203 lb 14.8 oz) IBW/kg (Calculated) : 70.7 TPN AdjBW (KG): 76.2 Body mass index is 30.11 kg/m.  Assessment: 35 yom with PMH UC, portal vein thrombosis, and laparoscopic converted to open cholecystectomy presenting to [facility] from [facility] for UC with L intra-abdominal fluid collection, suspected secondary to descending colonic perforation. Patient acute worsening of nausea and bilious emesis x 1 mo, non-bloody diarrhea 3-4x daily, and hematuria. Also reports poor PO intake over the last month. Patient reports wanting to eat but GI feels TPN appropriate for now to enhance ability to heal, allowing CLD for now pre-op.  Glucose / Insulin: No hx DM. CBGs controlled. 6 units insulin aspart in last 24 hours Electrolytes: Na 133, others WNL Renal: SCr stable wnl, BUN wnl LFTs / TGs: LFTs trend up - AST now 87, ALT 145 - increase, Tbili / TG wnl  Prealbumin / albumin: prealbumin 7.3>>12, albumin 1.9 Intake / Output; MIVF: Having multiple loose BMs, Drain 50 ml/24hr output, positive 17L (not volume overload on physical exam) GI Imaging: 5/24 CT abd pelvis - L intra-abdominal gas/fluid collection, ?septic thrombophlebitis, ?colonic perforation 5/31 CT abd pelvis - reduction in abdominal abscess, no new collections, persistent fistula with decreased fluid Surgeries / Procedures: 5/27 - IR for drain placement  Central access: PICC placed 08/16/19 TPN start date: 08/16/19  Nutritional Goals (per RD recommendation on 5/26): kCal: 2000-2200, Protein: 110-125g, Fluid: >2L  Current Nutrition:  TPN FLD started 5/29 - Spoke with pt who reported eating 75% of diet, denies nausea/abdominal pain  Boost feeding supplement TID - 2 doses charted yesterday  Plan:  Continue TPN rate at goal rate 90 mL/hr at 1800. -This TPN will provide 121g protein, 302g CHO, and 63g  SMOF lipids, for total 2138 kCal, meeting 100% of patient needs. Electrolytes in TPN: increase Na to 173mq/L. Cl:Ac ratio 1:1 Add standard MVI and trace elements + folic acid 174mper MD to TPN Adjust SSI to sSSI q8h and adjust as needed  Follow up plans to advance diet, and monitor LFTs (hold off on cycling given plan to stop TPN)   GrCristela FeltPharmD PGY1 Pharmacy Resident [employer]: [phone] 17786/03/2019 10:03 AM

## 2019-08-21 NOTE — Progress Notes (Signed)
Subjective: CC: Tolerating FLD and boosts without abdominal pain, n/v. Passing flatus. Small BM's that are soft yesterday.   Objective: Vital signs in last 24 hours: Temp:  [98 F (36.7 C)-99.3 F (37.4 C)] 98.5 F (36.9 C) (06/01 0549) Pulse Rate:  [92-106] 92 (06/01 0549) Resp:  [18] 18 (06/01 0549) BP: (104-111)/(67-75) 104/75 (06/01 0549) SpO2:  [97 %] 97 % (06/01 0549) Last BM Date: 08/19/19  Intake/Output from previous day: 05/31 0701 - 06/01 0700 In: 2861.5 [P.O.:1520; I.V.:1177.2; IV Piggyback:149.2] Out: 50 [Drains:50] Intake/Output this shift: No intake/output data recorded.  PE: Gen:  Alert, NAD, pleasant Pulm:  Normal rate and effort  Abd: Soft, NT/ND, +BS, drain in place with purulent drainage in JP. 50cc/24 hours.  Ext:  No erythema, edema, or tenderness BUE/BLE  Psych: A&Ox3  Skin: no rashes noted, warm and dry   Lab Results:  Recent Labs    08/20/19 0409  WBC 4.5  HGB 9.8*  HCT 31.6*  PLT 276   BMET Recent Labs    08/20/19 0409 08/21/19 0401  NA 134* 133*  K 4.3 4.3  CL 101 101  CO2 26 25  GLUCOSE 127* 134*  BUN 8 8  CREATININE 0.56* 0.61  CALCIUM 8.1* 8.2*   PT/INR Recent Labs    08/21/19 0401  LABPROT 13.6  INR 1.1   CMP     Component Value Date/Time   NA 133 (L) 08/21/2019 0401   K 4.3 08/21/2019 0401   CL 101 08/21/2019 0401   CO2 25 08/21/2019 0401   GLUCOSE 134 (H) 08/21/2019 0401   BUN 8 08/21/2019 0401   CREATININE 0.61 08/21/2019 0401   CREATININE 0.88 12/19/2018 1547   CALCIUM 8.2 (L) 08/21/2019 0401   PROT 6.6 08/21/2019 0401   ALBUMIN 1.9 (L) 08/21/2019 0401   AST 87 (H) 08/21/2019 0401   ALT 145 (H) 08/21/2019 0401   ALKPHOS 50 08/21/2019 0401   BILITOT 0.2 (L) 08/21/2019 0401   GFRNONAA >60 08/21/2019 0401   GFRAA >60 08/21/2019 0401   Lipase     Component Value Date/Time   LIPASE 51 08/13/2019 0609       Studies/Results: CT ABDOMEN PELVIS W CONTRAST  Result Date: 08/20/2019 CLINICAL  DATA:  Inpatient. Ulcerative colitis. Interval percutaneous left abdominal abscess drainage. EXAM: CT ABDOMEN AND PELVIS WITH CONTRAST TECHNIQUE: Multidetector CT imaging of the abdomen and pelvis was performed using the standard protocol following bolus administration of intravenous contrast. CONTRAST:  150m OMNIPAQUE IOHEXOL 300 MG/ML  SOLN COMPARISON:  08/13/2019 CT abdomen/pelvis. FINDINGS: Lower chest: No significant pulmonary nodules or acute consolidative airspace disease. Hepatobiliary: Normal liver size. No discrete liver mass. Cholecystectomy. No biliary ductal dilatation. Pancreas: Normal, with no mass or duct dilation. Spleen: Normal size. No mass. Adrenals/Urinary Tract: Normal adrenals. No hydronephrosis. Exophytic hyperdense 2.2 cm renal cortical lesion in the anterior lower right kidney (series 3/image 48), unchanged. Numerous simple renal cysts in both kidneys, largest 11.9 cm in the upper right kidney. Several subcentimeter hypodense renal cortical lesions in both kidneys are too small to characterize. Normal bladder. Stomach/Bowel: Small hiatal hernia. Otherwise normal nondistended stomach. Normal caliber small bowel. Appendectomy. Scattered mild colonic diverticula. Wall thickening throughout sigmoid colon and rectum, improved. Interval placement of percutaneous pigtail drain in the complex left abdominal collection, which is significantly decreased, with no residual measurable collection surrounding the catheter. Mild reactive wall thickening in the left small bowel is improved. Residual tiny 2.6 x 1.5 cm posterior  left peritoneal collection (series 3/image 52), decreased from 9.6 x 5.5 cm. 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 Persistent ill-defined fat stranding and  phlegmonous change in the left small bowel mesentery, significantly improved (series 3/image 65). No new collections. Vascular/Lymphatic: Normal caliber abdominal aorta. Patent portal, splenic, hepatic and renal veins. No pathologically enlarged lymph nodes in the abdomen or pelvis. Reproductive: Normal size prostate.  No ascites. Other: No pneumoperitoneum, ascites or focal fluid collection. Musculoskeletal: No aggressive appearing focal osseous lesions. Moderate lumbar spondylosis. IMPRESSION: 1. Significant interval reduction in size of complex left abdominal abscess status post percutaneous pigtail drain placement, as detailed. 2. Persistent ill-defined fat stranding and phlegmonous change in the left small bowel mesentery, significantly improved. No new collections. 3. 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 4. Reactive left small bowel thickening is improved. Wall thickening throughout the sigmoid colon and rectum is improved. 5. Stable exophytic hyperdense 2.2 cm renal cortical lesion in the anterior lower right kidney, indeterminate. MRI abdomen without and with IV contrast is recommended on a short term outpatient basis for further characterization. 6. Small hiatal hernia. Electronically Signed   By: Ilona Sorrel M.D.   On: 08/20/2019 08:25    Anti-infectives: Anti-infectives (From admission, onward)   Start     Dose/Rate Route Frequency Ordered Stop   08/19/19 1545  piperacillin-tazobactam (ZOSYN) IVPB 3.375 g     3.375 g 12.5 mL/hr over 240 Minutes Intravenous Every 8 hours 08/19/19 1533     08/13/19 2300  piperacillin-tazobactam (ZOSYN) IVPB 3.375 g  Status:  Discontinued     3.375 g 100 mL/hr over 30 Minutes Intravenous Every 8 hours 08/13/19 2149 08/19/19 1532   08/13/19 0930  piperacillin-tazobactam (ZOSYN) IVPB 3.375 g     3.375 g 100 mL/hr over 30 Minutes  Intravenous  Once 08/13/19 0925 08/13/19 1638       Assessment/Plan GERD Severe Malnutrition - prealbumin 12.0 (5/31), TPN Ulcerative Colitis- Diagnosed 07/2018, currently takes Pepcid, daily iron, Lialda, oral prednisone taper daily. Sees Dr. Buford Dresser in [place] but was managed by Dr. Oneida Alar before she left the practice.GI following here, stopped steroids 5/27   Left perirectal and retroperitoneal/LUQ fluid collections, suspect secondary to descending colonic perforation - s/p IR drain 5/27, continue drain. Cx with E. Coli - CT 5/31 with improvement - No immediate surgical needs - Cont abx - Cont liquids and TPN - We will continue to follow  FEN:IVF, FLD, Boost, TPN ID: [id number]>> VTE: SCD's,Lovenox  Foley: none Follow up: TBD   LOS: 7 days    Jillyn Ledger , [facility] Surgery 08/21/2019, 10:10 AM Please see Amion for pager number during day hours 7:00am-4:30pm

## 2019-08-21 NOTE — Progress Notes (Signed)
PROGRESS NOTE    NEFI MUSICH  KZS:[id number] DOB: Sep 27, 1960 DOA: 08/13/2019 PCP: Patient, No Pcp Per   Chief Complaint  Patient presents with  . Emesis    Brief Narrative:  HPI per Dr. Abigail Miyamoto is a 59 y.o. male with medical history of Ulcerative colitis, upper GI bleed, portal vein thrombus, GERD presenting with over 1 week history of nausea, vomiting, and diarrhea.  Patient was recently admitted to the hospital from 07/19/2019 to 07/20/2019 with ulcerative colitis flare.  He was discharged home with a prednisone taper.  He ultimately followed up with his gastroenterologist on 08/01/2019.  He has Lialda was increased to 4.8 g daily, and he was instructed to continue on his prednisone taper.  The patient states that he has tapered himself down to 10 mg of prednisone.  He began his 10 mg dose on 08/10/2019.  However, the patient has continued to have worsening nausea, vomiting, and diarrhea in the past week.  He denies any hematemesis, hematochezia, melena, fevers, chills.  He states that he has never had any abdominal pain even with his initial diagnosis of ulcerative colitis.  He states that he does not have any abdominal pain at this point.  He denies any fevers, chills, headache, chest pain, shortness breath, coughing, hemoptysis, hematemesis, dysuria, hematuria.  He denies any new medications.  He does not drink any alcohol or take any NSAIDs. In the emergency department, the patient has been afebrile and hemodynamically stable with oxygen saturation 96-100% on room air.  WBC was initially 14.9 with hemoglobin 11.4, and platelets 275,000.  Initial sodium was 127 with serum creatinine 0.79.  CT of the abdomen and pelvis showed Gas and fluid in the LEFT retroperitoneum and extending into peritoneal spaces via mesenteric pathways in the LEFT abdomen, associated with interloop fluid and gas as well as loculated collections in the mesorectum.  Because of the extensive distribution,  septic thrombophlebitis was favored associated with local colonic perforation.  There was gas noted along the IMV at the site of the previous thrombus into the left retroperitoneum. Initial contact was made with [facility] whom had accepted the patient in transfer.  However the patient continued to wait in the emergency department for over 24 hours.  Subsequently, general surgery, Dr. Aviva Signs was contacted.  He recommended transfer to a higher level of care.  He ultimately spoke with general surgery, Dr. Jaymes Graff, at [facility] and the plan is to transfer the patient to [facility] for further surgical evaluation.  The patient was started on intravenous Zosyn and IV fluids.  Assessment & Plan:   Principal Problem:   Colon perforation ([place]) Active Problems:   Hyponatremia   Hypokalemia   Nausea vomiting and diarrhea   Ulcerative colitis (HCC)   Iron deficiency anemia due to chronic blood loss   Dehydration  1 concern for peritonitis secondary to perforated colon/intraabdominal fluid collection As noted on CT abdomen and pelvis.  Patient currently afebrile.  Normal white count.  Patient denies any abdominal pain.  Nausea vomiting diarrhea improving.  Patient initially seen by Dr. Arnoldo Morale, general surgery at [facility] who discussed case with general surgery at [facility] [place] and patient transferred to [facility] for further surgical evaluation.  Patient seen in consultation by general surgery who feel patient is not toxic appearing and recommended management patient's ulcerative colitis.  Patient currently tolerating full liquids.  Patient status post aspiration and drain placement of intra-abdominal fluid collection by  IR, 08/16/2019 with cultures consistent with E. coli.  IV Solu-Medrol discontinued per GI. Per general surgery no surgical needs at this time.  Patient started on TPN.   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  Wall thickening throughout sigmoid colon and rectum improved.  Stable exophytic hypodense 2.2 cm renal cortical lesion in the anterior lower right kidney, indeterminate.  MRI abdomen without and with IV contrast recommended in the short-term outpatient basis for further characterization.  Small hiatial hernia.  Continue IV Zosyn. GI and general surgery following and appreciate input and recommendations.   2.  Ulcerative colitis  Patient states some improvement with nausea and emesis.  Abdominal pain improved.  Loose stools improved.  CRP elevated at 10.1.  Sed rate elevated at 70.  Currently tolerating full liquids.  Patient was on IV Solu-Medrol which was discontinued 08/15/2019 per GI.  Continue IV Zosyn. Patient seen and being followed by GI. Patient status post aspiration and drainage of intra-abdominal fluid collection 08/16/2019 per Dr. Earleen Newport with purulent fluid noted in drain.  Intra-abdominal fluid cultures with E. coli with a mixed culture mixed anaerobic flora present.  Resistant to Bactrim otherwise sensitive.  Patient on TPN per GI recommendations.  On IV Zosyn.  GI following and I appreciate the input and recommendations.  3.  Hyponatremia Likely secondary to hypovolemic hyponatremia.  Improving with hydration.  4.  Hypokalemia Repleted.  Potassium at 4.3.  Magnesium at 2.2.  Electrolytes being repleted per pharmacy via TPN.  Follow.    5.  Iron deficiency anemia anemia, unknown/folate deficiency. Likely secondary to problem #2.  Anemia panel consistent with  anemia of chronic disease.  Folic acid level of 5.1.  Hemoglobin at 9.8.  Continue folate supplementation.  6.  Severe protein calorie malnutrition Continue TPN.  On full liquid diet.  Diet advancement per GI.  Follow.  7.  IMV thrombosis May need anticoagulation however, we will defer to GI.  8.  2.2 cm renal lesion Will need close outpatient follow-up with MRI with and without contrast for further characterization..    DVT prophylaxis: SCDs Code Status full Family Communication: Updated patient.  No family at bedside. Disposition:   Status is: Inpatient    Dispo: The patient is from: Home              Anticipated d/c is to: Home              Anticipated d/c date is: To be determined.              Patient currently on IV antibiotics, status post CT-guided drain placement to abdominal abscess, being followed by general surgery and GI due to ulcerative colitis and concern for peritonitis.  Not ready for discharge.        Consultants:   General surgery: Dr. Olena Leatherwood [facility] 08/13/2019  General surgery: Dr. Nyoka Cowden [facility] 08/15/2019  Gastroenterology: Dr. Rush Landmark 08/15/2019  Interventional radiology: Dr. Earleen Newport  Procedures:   CT abdomen and pelvis 08/13/2019, 08/20/2019  Drainage of left abdominal abscess with 10 French drain per IR/Dr. Earleen Newport 08/16/2019  Antimicrobials:   IV  Zosyn 08/13/2019   Subjective: Patient sitting up in chair.  Tolerating current full liquid diet.  Denies chest pain or shortness of breath.  No abdominal pain.  Feeling better.   Objective: Vitals:   08/20/19 0437 08/20/19 1412 08/20/19 2033 08/21/19 0549  BP: 108/71 106/67 111/68 104/75  Pulse: 91 (!) 106 96 92  Resp: 17 18 18 18   Temp: 98.8 F (37.1 C) 98 F (36.7 C) 99.3 F (37.4 C) 98.5 F (36.9 C)  TempSrc: Oral Oral Oral Oral  SpO2: 94% 97% 97% 97%  Weight:      Height:        Intake/Output Summary (Last 24 hours) at 08/21/2019 1310 Last data filed at  08/21/2019 1200 Gross per 24 hour  Intake 2256.45 ml  Output 50 ml  Net 2206.45 ml   Filed Weights   08/13/19 0549 08/14/19 1959  Weight: 92.5 kg 92.5 kg    Examination:  General exam: NAD Respiratory system: Lungs clear to auscultation bilaterally.  No wheezes, no crackles, no rhonchi.  Normal respiratory effort.  Speaking in full sentences. Cardiovascular system: RRR no murmurs rubs or gallops.  No JVD.  No lower extremity edema.  Gastrointestinal system: Abdomen is nontender, nondistended, soft, positive bowel sounds.  No rebound.  No guarding.  Drain with purulent drainage. Central nervous system: Alert and oriented. No focal neurological deficits. Extremities: Symmetric 5 x 5 power. Skin: No rashes, lesions or ulcers Psychiatry: Judgement and insight appear normal. Mood & affect appropriate.     Data Reviewed: I have personally reviewed following labs and imaging studies  CBC: Recent Labs  Lab 08/15/19 0230 08/16/19 0203 08/17/19 0434 08/20/19 0409  WBC 4.2 5.1 6.9 4.5  NEUTROABS  --  3.7 6.0 3.2  HGB 10.0* 9.8* 10.4* 9.8*  HCT 31.9* 31.4* 33.2* 31.6*  MCV 85.3 85.3 85.8 86.3  PLT 238 269 311 885    Basic Metabolic Panel: Recent Labs  Lab 08/17/19 0434 08/18/19 0434 08/19/19 0427 08/20/19 0409 08/21/19 0401  NA 132* 132* 133* 134* 133*  K 3.6 3.7 4.0 4.3 4.3  CL 101 101 103 101 101  CO2 22 23 25 26 25   GLUCOSE 121* 120* 131* 127* 134*  BUN 7 7 7 8 8   CREATININE 0.69 0.59* 0.55* 0.56* 0.61  CALCIUM 7.6* 7.7* 7.8* 8.1* 8.2*  MG 1.7 1.8 2.1 2.0 2.2  PHOS 3.6 2.6 2.8 3.3 3.8    GFR: Estimated Creatinine Clearance: 113 mL/min (by C-G formula based on SCr of 0.61 mg/dL).  Liver Function Tests: Recent Labs  Lab 08/15/19 0230 08/16/19 0203 08/17/19 0434 08/20/19 0409 08/21/19 0401  AST 38  --  25 97* 87*  ALT 71*  --  47* 127* 145*  ALKPHOS 49  --  48 46 50  BILITOT 0.4  --  0.4 0.5 0.2*  PROT 6.0*  --  5.7* 6.4* 6.6  ALBUMIN 1.5* 1.5* 1.6* 1.8*  1.9*    CBG: Recent Labs  Lab 08/20/19 1152 08/20/19 1805 08/21/19 0008 08/21/19 0546 08/21/19 1148  GLUCAP 124* 116* 141* 127* 120*     Recent Results (from the past 240 hour(s))  Culture, blood (routine x 2)     Status: None   Collection Time: 08/13/19 10:03 AM   Specimen: BLOOD RIGHT HAND  Result Value Ref Range Status   Specimen Description BLOOD RIGHT HAND  Final   Special Requests   Final    BOTTLES DRAWN AEROBIC AND ANAEROBIC Blood Culture adequate volume  Culture   Final    NO GROWTH 5 DAYS Performed at [facility], [address] [place], [place] [address]    Report Status 08/18/2019 FINAL  Final  Culture, blood (routine x 2)     Status: None   Collection Time: 08/13/19 10:08 AM   Specimen: BLOOD LEFT HAND  Result Value Ref Range Status   Specimen Description BLOOD LEFT HAND  Final   Special Requests   Final    BOTTLES DRAWN AEROBIC AND ANAEROBIC Blood Culture adequate volume   Culture   Final    NO GROWTH 5 DAYS Performed at [facility], [address] [place], [place] [address]    Report Status 08/18/2019 FINAL  Final  SARS Coronavirus 2 by RT PCR (hospital order, performed in [facility] hospital lab) Nasopharyngeal Nasopharyngeal Swab     Status: None   Collection Time: 08/13/19 11:33 AM   Specimen: Nasopharyngeal Swab  Result Value Ref Range Status   SARS Coronavirus 2 NEGATIVE NEGATIVE Final    Comment: (NOTE) SARS-CoV-2 target nucleic acids are NOT DETECTED. The SARS-CoV-2 RNA is generally detectable in upper and lower respiratory specimens during the acute phase of infection. The lowest concentration of SARS-CoV-2 viral copies this assay can detect is 250 copies / mL. A negative result does not preclude SARS-CoV-2 infection and should not be used as the sole basis for treatment or other patient management decisions.  A negative result may occur with improper specimen collection / handling, submission of specimen other than nasopharyngeal swab,  presence of viral mutation(s) within the areas targeted by this assay, and inadequate number of viral copies (<250 copies / mL). A negative result must be combined with clinical observations, patient history, and epidemiological information. Fact Sheet for Patients:   StrictlyIdeas.no Fact Sheet for Healthcare Providers: BankingDealers.co.za This test is not yet approved or cleared  by the [place] FDA and has been authorized for detection and/or diagnosis of SARS-CoV-2 by FDA under an Emergency Use Authorization (EUA).  This EUA will remain in effect (meaning this test can be used) for the duration of the COVID-19 declaration under Section 564(b)(1) of the Act, 21 U.S.C. section 360bbb-3(b)(1), unless the authorization is terminated or revoked sooner. Performed at [facility], [address] [place], [place] [address]   Aerobic/Anaerobic Culture (surgical/deep wound)     Status: None   Collection Time: 08/16/19  2:27 PM   Specimen: Abscess  Result Value Ref Range Status   Specimen Description ABSCESS  Final   Special Requests NONE  Final   Gram Stain   Final    ABUNDANT WBC PRESENT, PREDOMINANTLY PMN ABUNDANT GRAM NEGATIVE RODS ABUNDANT GRAM POSITIVE COCCI IN PAIRS IN CHAINS MODERATE GRAM POSITIVE RODS Performed at [facility], [address] [place], [place] [address]    Culture   Final    RARE ESCHERICHIA COLI WITH IN MIXED CULTURE MIXED ANAEROBIC FLORA PRESENT.  CALL LAB IF FURTHER IID REQUIRED.    Report Status 08/20/2019 FINAL  Final   Organism ID, Bacteria ESCHERICHIA COLI  Final      Susceptibility   Escherichia coli - MIC*    AMPICILLIN <=2 SENSITIVE Sensitive     CEFAZOLIN <=4 SENSITIVE Sensitive     CEFEPIME <=1 SENSITIVE Sensitive     CEFTAZIDIME <=1 SENSITIVE Sensitive     CEFTRIAXONE <=1 SENSITIVE Sensitive     CIPROFLOXACIN <=0.25 SENSITIVE Sensitive     GENTAMICIN 4 SENSITIVE Sensitive      IMIPENEM <=0.25  SENSITIVE Sensitive     TRIMETH/SULFA >=320 RESISTANT Resistant     AMPICILLIN/SULBACTAM <=2 SENSITIVE Sensitive     PIP/TAZO <=4 SENSITIVE Sensitive     * RARE ESCHERICHIA COLI         Radiology Studies: CT ABDOMEN PELVIS W CONTRAST  Result Date: 08/20/2019 CLINICAL DATA:  Inpatient. Ulcerative colitis. Interval percutaneous left abdominal abscess drainage. EXAM: CT ABDOMEN AND PELVIS WITH CONTRAST TECHNIQUE: Multidetector CT imaging of the abdomen and pelvis was performed using the standard protocol following bolus administration of intravenous contrast. CONTRAST:  16m OMNIPAQUE IOHEXOL 300 MG/ML  SOLN COMPARISON:  08/13/2019 CT abdomen/pelvis. FINDINGS: Lower chest: No significant pulmonary nodules or acute consolidative airspace disease. Hepatobiliary: Normal liver size. No discrete liver mass. Cholecystectomy. No biliary ductal dilatation. Pancreas: Normal, with no mass or duct dilation. Spleen: Normal size. No mass. Adrenals/Urinary Tract: Normal adrenals. No hydronephrosis. Exophytic hyperdense 2.2 cm renal cortical lesion in the anterior lower right kidney (series 3/image 48), unchanged. Numerous simple renal cysts in both kidneys, largest 11.9 cm in the upper right kidney. Several subcentimeter hypodense renal cortical lesions in both kidneys are too small to characterize. Normal bladder. Stomach/Bowel: Small hiatal hernia. Otherwise normal nondistended stomach. Normal caliber small bowel. Appendectomy. Scattered mild colonic diverticula. Wall thickening throughout sigmoid colon and rectum, improved. Interval placement of percutaneous pigtail drain in the complex left abdominal collection, which is significantly decreased, with no residual measurable collection surrounding the catheter. Mild reactive wall thickening in the left small bowel is improved. Residual tiny 2.6 x 1.5 cm posterior left peritoneal collection (series 3/image 52), decreased from 9.6 x 5.5 cm. 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 Persistent ill-defined fat stranding and phlegmonous change in the left small bowel mesentery, significantly improved (series 3/image 65). No new collections. Vascular/Lymphatic: Normal caliber abdominal aorta. Patent portal, splenic, hepatic and renal veins. No pathologically enlarged lymph nodes in the abdomen or pelvis. Reproductive: Normal size prostate.  No ascites. Other: No pneumoperitoneum, ascites or focal fluid collection. Musculoskeletal: No aggressive appearing focal osseous lesions. Moderate lumbar spondylosis. IMPRESSION: 1. Significant interval reduction in size of complex left abdominal abscess status post percutaneous pigtail drain placement, as detailed. 2. Persistent ill-defined fat stranding and phlegmonous change in the left small bowel mesentery, significantly improved. No new collections. 3. 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 4. Reactive left small bowel thickening is improved. Wall thickening throughout the sigmoid colon and rectum is improved. 5. Stable exophytic hyperdense 2.2 cm renal cortical lesion in the anterior lower right kidney, indeterminate. MRI abdomen without and with IV contrast is recommended on a short term outpatient basis for further characterization. 6. Small hiatal hernia. Electronically Signed   By: JIlona SorrelM.D.   On: 08/20/2019 08:25        Scheduled Meds: . Chlorhexidine Gluconate Cloth  6 each Topical Daily  . enoxaparin (LOVENOX) injection  40 mg Subcutaneous Daily  . insulin aspart  0-9 Units Subcutaneous 3 times per day  . sodium chloride flush  10-40 mL  Intracatheter Q12H  . sodium chloride flush  5 mL Intracatheter Q8H   Continuous Infusions: . piperacillin-tazobactam (ZOSYN)  IV 12.5 mL/hr at 08/21/19 07616 . TPN ADULT (ION) 90 mL/hr at  08/21/19 4599  . TPN ADULT (ION)       LOS: 7 days    Time spent: 35 minutes    Irine Seal, MD [facility]   To contact the attending provider between 7A-7P or the covering provider during after hours 7P-7A, please log into the web site [email or web address] and access using universal  password for that web site. If you do not have the password, please call the hospital operator.  08/21/2019, 1:10 PM

## 2019-08-21 NOTE — Progress Notes (Signed)
Nutrition Follow-up  RD working remotely.  DOCUMENTATION CODES:   Not applicable  INTERVENTION:   -TPN management per pharmacy -D/c Boost Breeze -Magic cup BID with meals, each supplement provides 290 kcal and 9 grams of protein -RD will follow for diet advancement and adjust supplement regimen as appriopriate  NUTRITION DIAGNOSIS:   Increased nutrient needs related to acute illness(peritonitis and intra-abdominal abscess) as evidenced by estimated needs.  Ongoing  GOAL:   Patient will meet greater than or equal to 90% of their needs  Progressing   MONITOR:   PO intake, Supplement acceptance, Diet advancement, Labs, Weight trends, Skin, I & O's  REASON FOR ASSESSMENT:   Consult New TPN/TNA  ASSESSMENT:   Brett Bright is a 59 y.o. male with medical history ofUlcerative colitis, upper GI bleed, portal vein thrombus, GERD presenting with over 1 week history of nausea, vomiting, and diarrhea.  5/27- s/p CT guided drainage of Left Abd abscess, 50F drain; PICC placed; TPN initiated 5/30- advanced to full liquids  Reviewed I/O's: +2.8 L x 24 hours and +17 L since admission  Drain output: 50 ml x 24 hours  Per GS notes, CT showed overall improvement in the acute abscess issues but with rectosigmoid fistula.   Pt resting quietly at time of visit. He is tolerating full liquid diet well; noted 100% of breakfast tray completed. Documented meal completion 50-90%.  Pt remains on TPN; receiving at goal rate of 90 ml/hr, which provides 2138 kcals and 121 grams protein, meeting 100% of estimated kcal and protein needs.   Labs reviewed: Na: 133, K, Mg, and Phos WDL. CBGS: 116-141 (inpatient orders for glycemic control are 0-15 units insulin aspart every 6 hours).   Diet Order:   Diet Order            Diet full liquid Room service appropriate? Yes; Fluid consistency: Thin  Diet effective now              EDUCATION NEEDS:   Not appropriate for education at this  time  Skin:  Skin Assessment: Skin Integrity Issues: Skin Integrity Issues:: Other (Comment) Other: LUQ drain puncture site  Last BM:  08/19/19  Height:   Ht Readings from Last 1 Encounters:  08/14/19 5' 9"  (1.753 m)    Weight:   Wt Readings from Last 1 Encounters:  08/14/19 92.5 kg    Ideal Body Weight:  72.7 kg  BMI:  Body mass index is 30.11 kg/m.  Estimated Nutritional Needs:   Kcal:  2000-2200  Protein:  110-125 grams  Fluid:  > 2 L    [name], RD, LDN, [place] Registered Dietitian II Certified Diabetes Care and Education Specialist Please refer to [facility] for RD and/or RD on-call/weekend/after hours pager

## 2019-08-22 DIAGNOSIS — D500 Iron deficiency anemia secondary to blood loss (chronic): Secondary | ICD-10-CM

## 2019-08-22 DIAGNOSIS — K5720 Diverticulitis of large intestine with perforation and abscess without bleeding: Principal | ICD-10-CM

## 2019-08-22 LAB — GLUCOSE, CAPILLARY
Glucose-Capillary: 136 mg/dL — ABNORMAL HIGH (ref 70–99)
Glucose-Capillary: 133 mg/dL — ABNORMAL HIGH (ref 70–99)
Glucose-Capillary: 120 mg/dL — ABNORMAL HIGH (ref 70–99)
Glucose-Capillary: 118 mg/dL — ABNORMAL HIGH (ref 70–99)

## 2019-08-22 MED ORDER — TRAVASOL 10 % IV SOLN
INTRAVENOUS | Status: AC
  Filled 2019-08-22: qty 604.8

## 2019-08-22 NOTE — Progress Notes (Signed)
PHARMACY - TOTAL PARENTERAL NUTRITION CONSULT NOTE   Indication: severe malnutrition  Patient Measurements: Height: 5' 9"  (175.3 cm) Weight: 92.5 kg (203 lb 14.8 oz) IBW/kg (Calculated) : 70.7 TPN AdjBW (KG): 76.2 Body mass index is 30.11 kg/m.  Assessment: 53 yom with PMH UC, portal vein thrombosis, and laparoscopic converted to open cholecystectomy presenting to [facility] from [facility] for UC with L intra-abdominal fluid collection, suspected secondary to descending colonic perforation. Patient acute worsening of nausea and bilious emesis x 1 mo, non-bloody diarrhea 3-4x daily, and hematuria. Also reports poor PO intake over the last month. Patient reports wanting to eat but GI feels TPN appropriate for now to enhance ability to heal, allowing CLD for now pre-op.  Glucose / Insulin: No hx DM. CBGs controlled. 6 units insulin aspart in last 24 hours Electrolytes: No lytes today. From 6/1: Na 133, others WNL Renal: SCr stable wnl, BUN wnl LFTs / TGs: LFTs trend up - AST now 87, ALT 145 - increase, Tbili / TG wnl  Prealbumin / albumin: prealbumin 7.3>>12, albumin 1.9 Intake / Output; MIVF: Having multiple loose BMs, Drain 55 ml/24hr output, positive 19L GI Imaging: 5/24 CT abd pelvis - L intra-abdominal gas/fluid collection, ?septic thrombophlebitis, ?colonic perforation 5/31 CT abd pelvis - reduction in abdominal abscess, no new collections, persistent fistula with decreased fluid Surgeries / Procedures: 5/27 - IR for drain placement  Central access: PICC placed 08/16/19 TPN start date: 08/16/19  Nutritional Goals (per RD recommendation on 6/1): kCal: 2000-2200, Protein: 110-125g, Fluid: >2L  Current Nutrition:  TPN FLD advanced to soft diet - eating 75% of meals Magic cups bid with meals (provides 290 kcal and 9g protein)  Plan:  - Will reduce the TPN rate to 45 cc/hr at 1800 today when the new bag is hung (half rate requested per surgery for weaning) - This TPN will provide 60.5g protein,  151g CHO, and 31.4g SMOF lipids, for total 1070 kcal meeting 50% of patient needs - Electrolytes in TPN: continue with Na at 143mq/L. Cl:Ac ratio 1:1 - Add standard MVI and trace elements + folic acid 122mper MD to TPN - Continue sSSI q8h and adjust as needed  - Will follow-up with TPN labs tomorrow - Possible wean to off 6/3 depending on diet tolerance/advancement  Thank you for allowing pharmacy to be a part of this patient's care.  ElAlycia RossettiPharmD, BCPS Clinical Pharmacist Clinical phone for 08/22/2019: [phone]/04/2019 7:22 AM   **Pharmacist phone directory can now be found on [email or web address] (PW [email or web address]).  Listed under [facility]

## 2019-08-22 NOTE — Progress Notes (Addendum)
Subjective: CC: Doing well. No abdominal pain, n/v. Adv to a soft diet yesterday. Tolerated dinner and finished >90% of meal per patient. Drinking all his boosts throughout the day. Had a more normal, formed BM this am.   Objective: Vital signs in last 24 hours: Temp:  [97.7 F (36.5 C)-98.6 F (37 C)] 97.7 F (36.5 C) (06/02 0353) Pulse Rate:  [91-108] 91 (06/02 0353) Resp:  [16-18] 16 (06/02 0353) BP: (102-113)/(70-73) 112/73 (06/02 0353) SpO2:  [98 %] 98 % (06/02 0353) Last BM Date: 08/19/19  Intake/Output from previous day: 06/01 0701 - 06/02 0700 In: 3467.3 [P.O.:1100; I.V.:2170; IV Piggyback:187.3] Out: 45 [Drains:45] Intake/Output this shift: No intake/output data recorded.  PE: Gen:  Alert, NAD, pleasant Pulm:  Normal rate and effort  Abd: Soft, NT/ND, +BS, drain in place with purulent drainage in JP. 45cc/24 hours.  Ext:  No erythema, edema, or tenderness BUE/BLE  Psych: A&Ox3  Skin: no rashes noted, warm and dry  Lab Results:  Recent Labs    08/20/19 0409  WBC 4.5  HGB 9.8*  HCT 31.6*  PLT 276   BMET Recent Labs    08/20/19 0409 08/21/19 0401  NA 134* 133*  K 4.3 4.3  CL 101 101  CO2 26 25  GLUCOSE 127* 134*  BUN 8 8  CREATININE 0.56* 0.61  CALCIUM 8.1* 8.2*   PT/INR Recent Labs    08/21/19 0401  LABPROT 13.6  INR 1.1   CMP     Component Value Date/Time   NA 133 (L) 08/21/2019 0401   K 4.3 08/21/2019 0401   CL 101 08/21/2019 0401   CO2 25 08/21/2019 0401   GLUCOSE 134 (H) 08/21/2019 0401   BUN 8 08/21/2019 0401   CREATININE 0.61 08/21/2019 0401   CREATININE 0.88 12/19/2018 1547   CALCIUM 8.2 (L) 08/21/2019 0401   PROT 6.6 08/21/2019 0401   ALBUMIN 1.9 (L) 08/21/2019 0401   AST 87 (H) 08/21/2019 0401   ALT 145 (H) 08/21/2019 0401   ALKPHOS 50 08/21/2019 0401   BILITOT 0.2 (L) 08/21/2019 0401   GFRNONAA >60 08/21/2019 0401   GFRAA >60 08/21/2019 0401   Lipase     Component Value Date/Time   LIPASE 51 08/13/2019  0609       Studies/Results: CT ABDOMEN PELVIS W CONTRAST  Result Date: 08/20/2019 CLINICAL DATA:  Inpatient. Ulcerative colitis. Interval percutaneous left abdominal abscess drainage. EXAM: CT ABDOMEN AND PELVIS WITH CONTRAST TECHNIQUE: Multidetector CT imaging of the abdomen and pelvis was performed using the standard protocol following bolus administration of intravenous contrast. CONTRAST:  132m OMNIPAQUE IOHEXOL 300 MG/ML  SOLN COMPARISON:  08/13/2019 CT abdomen/pelvis. FINDINGS: Lower chest: No significant pulmonary nodules or acute consolidative airspace disease. Hepatobiliary: Normal liver size. No discrete liver mass. Cholecystectomy. No biliary ductal dilatation. Pancreas: Normal, with no mass or duct dilation. Spleen: Normal size. No mass. Adrenals/Urinary Tract: Normal adrenals. No hydronephrosis. Exophytic hyperdense 2.2 cm renal cortical lesion in the anterior lower right kidney (series 3/image 48), unchanged. Numerous simple renal cysts in both kidneys, largest 11.9 cm in the upper right kidney. Several subcentimeter hypodense renal cortical lesions in both kidneys are too small to characterize. Normal bladder. Stomach/Bowel: Small hiatal hernia. Otherwise normal nondistended stomach. Normal caliber small bowel. Appendectomy. Scattered mild colonic diverticula. Wall thickening throughout sigmoid colon and rectum, improved. Interval placement of percutaneous pigtail drain in the complex left abdominal collection, which is significantly decreased, with no residual measurable collection surrounding the  catheter. Mild reactive wall thickening in the left small bowel is improved. Residual tiny 2.6 x 1.5 cm posterior left peritoneal collection (series 3/image 52), decreased from 9.6 x 5.5 cm. 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 Persistent ill-defined fat stranding and phlegmonous change in the left small bowel mesentery, significantly improved (series 3/image 65). No new collections. Vascular/Lymphatic: Normal caliber abdominal aorta. Patent portal, splenic, hepatic and renal veins. No pathologically enlarged lymph nodes in the abdomen or pelvis. Reproductive: Normal size prostate.  No ascites. Other: No pneumoperitoneum, ascites or focal fluid collection. Musculoskeletal: No aggressive appearing focal osseous lesions. Moderate lumbar spondylosis. IMPRESSION: 1. Significant interval reduction in size of complex left abdominal abscess status post percutaneous pigtail drain placement, as detailed. 2. Persistent ill-defined fat stranding and phlegmonous change in the left small bowel mesentery, significantly improved. No new collections. 3. 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 4. Reactive left small bowel thickening is improved. Wall thickening throughout the sigmoid colon and rectum is improved. 5. Stable exophytic hyperdense 2.2 cm renal cortical lesion in the anterior lower right kidney, indeterminate. MRI abdomen without and with IV contrast is recommended on a short term outpatient basis for further characterization. 6. Small hiatal hernia. Electronically Signed   By: Ilona Sorrel M.D.   On: 08/20/2019 08:25    Anti-infectives: Anti-infectives (From admission, onward)   Start     Dose/Rate Route Frequency Ordered Stop   08/19/19 1545  piperacillin-tazobactam (ZOSYN) IVPB 3.375 g     3.375 g 12.5 mL/hr over 240 Minutes Intravenous Every 8 hours 08/19/19 1533     08/13/19 2300  piperacillin-tazobactam (ZOSYN) IVPB 3.375 g  Status:  Discontinued     3.375 g 100 mL/hr over 30 Minutes Intravenous Every 8 hours 08/13/19 2149 08/19/19 1532    08/13/19 0930  piperacillin-tazobactam (ZOSYN) IVPB 3.375 g     3.375 g 100 mL/hr over 30 Minutes Intravenous  Once 08/13/19 0925 08/13/19 1638       Assessment/Plan Renal Lesion - Per [facility] 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 Sees Dr. Buford Dresser in [place] but was managed by Dr. Oneida Alar before she left the practice.Steroids stopped 5/27 per GI. Plans to follow up with [place] as an outpatient.  Left perirectal and retroperitoneal/LUQ fluid collections, suspect secondary to descending colonic perforation - s/p IR drain 5/27, continue drain. Cx with E. Coli - Cont abx. Will discuss with MD duration  - CT 5/31 with improvement - No immediate surgical needs - Cont abx - Cont liquids and TPN - We will continue to follow  FEN: Soft, Boost, 1/2 TPN ID: [id number]>> VTE: SCD's,Lovenox  Foley: none Follow up: GI, CRS   LOS: 8 days    Jillyn Ledger , [facility] Surgery 08/22/2019, 8:01 AM Please see Amion for pager number during day hours 7:00am-4:30pm

## 2019-08-22 NOTE — Progress Notes (Signed)
Referring Physician(s): Dr. Georgette Dover  Supervising Physician: Aletta Edouard  Patient Status:  [facility] - In-pt  Chief Complaint: Intra-abdominal fluid collection  Subjective: Resting in chair.   Continues with purulent-appearing, foul-smelling output.   Allergies: Patient has no known allergies.  Medications: Prior to Admission medications   Medication Sig Start Date End Date Taking? Authorizing Provider  acetaminophen (TYLENOL) 325 MG tablet Take 325 mg by mouth every 6 (six) hours as needed for fever.   Yes [provider]  Ascorbic Acid (VITAMIN C PO) Take 1 tablet by mouth daily. Drops daily    Yes [provider]  ferrous sulfate (FEROSUL) 325 (65 FE) MG tablet Take 1 tablet (325 mg total) by mouth daily with breakfast. 07/20/19  Yes Johnson, Clanford L, MD  mesalamine (LIALDA) 1.2 g EC tablet Take 4 tablets (4.8 g total) by mouth daily with breakfast. 07/20/19  Yes Johnson, Clanford L, MD  omeprazole (PRILOSEC) 20 MG capsule Take 20 mg by mouth daily.   Yes [provider]  predniSONE (DELTASONE) 10 MG tablet Take 4 PO QAM x7days, 3 PO QAM x7days, 2 PO QAM x7days, 1 po QAM x 7 Patient taking differently: Take 10-40 mg by mouth See admin instructions. Take 40 mg every morning for 7 days, then 30 mg every morning for 7 days, then 20 mg every morning for 7 days,  then 10 mg every morning for 7 days, then stop. 07/21/19  Yes Murlean Iba, MD     Vital Signs: BP 112/73 (BP Location: Left Arm)   Pulse 91   Temp 97.7 F (36.5 C) (Oral)   Resp 16   Ht 5' 9"  (1.753 m)   Wt 203 lb 14.8 oz (92.5 kg)   SpO2 98%   BMI 30.11 kg/m   Physical Exam  NAD, alert Abdomen:  LLQ drain in place. Insertion site c/d/i. Thick, purulent, foul-smelling output.   Imaging: CT ABDOMEN PELVIS W CONTRAST  Result Date: 08/20/2019 CLINICAL DATA:  Inpatient. Ulcerative colitis. Interval percutaneous left abdominal abscess drainage. EXAM: CT ABDOMEN AND PELVIS WITH  CONTRAST TECHNIQUE: Multidetector CT imaging of the abdomen and pelvis was performed using the standard protocol following bolus administration of intravenous contrast. CONTRAST:  163m OMNIPAQUE IOHEXOL 300 MG/ML  SOLN COMPARISON:  08/13/2019 CT abdomen/pelvis. FINDINGS: Lower chest: No significant pulmonary nodules or acute consolidative airspace disease. Hepatobiliary: Normal liver size. No discrete liver mass. Cholecystectomy. No biliary ductal dilatation. Pancreas: Normal, with no mass or duct dilation. Spleen: Normal size. No mass. Adrenals/Urinary Tract: Normal adrenals. No hydronephrosis. Exophytic hyperdense 2.2 cm renal cortical lesion in the anterior lower right kidney (series 3/image 48), unchanged. Numerous simple renal cysts in both kidneys, largest 11.9 cm in the upper right kidney. Several subcentimeter hypodense renal cortical lesions in both kidneys are too small to characterize. Normal bladder. Stomach/Bowel: Small hiatal hernia. Otherwise normal nondistended stomach. Normal caliber small bowel. Appendectomy. Scattered mild colonic diverticula. Wall thickening throughout sigmoid colon and rectum, improved. Interval placement of percutaneous pigtail drain in the complex left abdominal collection, which is significantly decreased, with no residual measurable collection surrounding the catheter. Mild reactive wall thickening in the left small bowel is improved. Residual tiny 2.6 x 1.5 cm posterior left peritoneal collection (series 3/image 52), decreased from 9.6 x 5.5 cm. 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 Persistent ill-defined fat stranding and phlegmonous change in the left small bowel mesentery, significantly improved (series 3/image 65). No new collections.  Vascular/Lymphatic: Normal caliber abdominal aorta. Patent portal, splenic, hepatic and renal veins. No pathologically enlarged lymph nodes in the abdomen or pelvis. Reproductive: Normal size prostate.  No ascites. Other: No pneumoperitoneum, ascites or focal fluid collection. Musculoskeletal: No aggressive appearing focal osseous lesions. Moderate lumbar spondylosis. IMPRESSION: 1. Significant interval reduction in size of complex left abdominal abscess status post percutaneous pigtail drain placement, as detailed. 2. Persistent ill-defined fat stranding and phlegmonous change in the left small bowel mesentery, significantly improved. No new collections. 3. 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 4. Reactive left small bowel thickening is improved. Wall thickening throughout the sigmoid colon and rectum is improved. 5. Stable exophytic hyperdense 2.2 cm renal cortical lesion in the anterior lower right kidney, indeterminate. MRI abdomen without and with IV contrast is recommended on a short term outpatient basis for further characterization. 6. Small hiatal hernia. Electronically Signed   By: Ilona Sorrel M.D.   On: 08/20/2019 08:25    Labs:  CBC: Recent Labs    08/15/19 0230 08/16/19 0203 08/17/19 0434 08/20/19 0409  WBC 4.2 5.1 6.9 4.5  HGB 10.0* 9.8* 10.4* 9.8*  HCT 31.9* 31.4* 33.2* 31.6*  PLT 238 269 311 276    COAGS: Recent Labs    08/16/19 0203 08/21/19 0401  INR 1.2 1.1    BMP: Recent Labs    08/18/19 0434 08/19/19 0427 08/20/19 0409 08/21/19 0401  NA 132* 133* 134* 133*  K 3.7 4.0 4.3 4.3  CL 101 103 101 101  CO2 23 25 26 25   GLUCOSE 120* 131* 127* 134*  BUN 7 7 8 8   CALCIUM 7.7* 7.8* 8.1* 8.2*  CREATININE 0.59* 0.55* 0.56* 0.61  GFRNONAA >60 >60 >60 >60  GFRAA >60 >60 >60 >60    LIVER FUNCTION TESTS: Recent Labs    08/15/19 0230  08/15/19 0230 08/16/19 0203 08/17/19 0434 08/20/19 0409 08/21/19 0401  BILITOT 0.4  --   --  0.4 0.5 0.2*  AST 38  --   --  25 97* 87*  ALT 71*  --   --  47* 127* 145*  ALKPHOS 49  --   --  48 46 50  PROT 6.0*  --   --  5.7* 6.4* 6.6  ALBUMIN 1.5*   < > 1.5* 1.6* 1.8* 1.9*   < > = values in this interval not displayed.    Assessment and Plan: Intra-abdominal fluid collection s/p aspiration and drainage 5/28 by Dr. Earleen Newport. Ongoing purulent, foul-smelling output.  45 mL output yesterday.  CT Abdomen Pelvis obtained yesterday and shows some improvement in fluid collection.  Will need drain injection to assess drain prior to removal once output decreased.  Nearing discharge if continues to improve per surgery notes.   Continues current drain care.  IR to follow.   Electronically Signed: Docia Barrier, PA 08/22/2019, 1:58 PM   I spent a total of 15 Minutes at the the patient's bedside AND on the patient's hospital floor or unit, greater than 50% of which was counseling/coordinating care for intra-abdominal fluid collection.

## 2019-08-22 NOTE — Progress Notes (Addendum)
Nutrition Follow-up  RD working remotely.  DOCUMENTATION CODES:   Not applicable  INTERVENTION:   -TPN management per pharmacy; plan to half rate at 1800 per discussion with pharmacist -Magic cup TID with meals, each supplement provides 290 kcal and 9 grams of protein  NUTRITION DIAGNOSIS:   Increased nutrient needs related to acute illness(peritonitis and intra-abdominal abscess) as evidenced by estimated needs.  Ongoing  GOAL:   Patient will meet greater than or equal to 90% of their needs  Progressing   MONITOR:   PO intake, Supplement acceptance, Diet advancement, Labs, Weight trends, Skin, I & O's  REASON FOR ASSESSMENT:   Consult New TPN/TNA  ASSESSMENT:   Brett Bright is a 59 y.o. male with medical history ofUlcerative colitis, upper GI bleed, portal vein thrombus, GERD presenting with over 1 week history of nausea, vomiting, and diarrhea.  5/27- s/pCT guided drainage of Left Abd abscess, 66F drain; PICC placed; TPN initiated 5/30- advanced to full liquids 6/1- advanced to soft diet  Reviewed I/O's: +3,4 L x 24 hours and +20.4 L since admission  Drain output: 45 ml x 24 hours  Pt continues to have a good appetite and tolerate diet well. Noted meal completion 75%; pt consumed about 1218 kcals and 42 grams protein PO yesterday (61% of estimated kcal needs and 38% of estimated protein needs).   Case discussed with pharmacy;plan to half TPN this evening (1800). Pt currently receiving TPN at goal rate of 90 ml/hr, which provides 2138 kcals and 121 grams protein, meeting 100% of estimated kcal and protein needs.   Labs reviewed: CBGS: 120-136 (inpatient orders for glycemic control are 0-9 units insulin aspart TID with meals).   Diet Order:   Diet Order            DIET SOFT Room service appropriate? Yes; Fluid consistency: Thin  Diet effective now              EDUCATION NEEDS:   Not appropriate for education at this time  Skin:  Skin  Assessment: Skin Integrity Issues: Skin Integrity Issues:: Other (Comment) Other: LUQ drain puncture site  Last BM:  08/19/19  Height:   Ht Readings from Last 1 Encounters:  08/14/19 5' 9"  (1.753 m)    Weight:   Wt Readings from Last 1 Encounters:  08/14/19 92.5 kg    Ideal Body Weight:  72.7 kg  BMI:  Body mass index is 30.11 kg/m.  Estimated Nutritional Needs:   Kcal:  2000-2200  Protein:  110-125 grams  Fluid:  > 2 L    [name], RD, LDN, [place] Registered Dietitian II Certified Diabetes Care and Education Specialist Please refer to [facility] for RD and/or RD on-call/weekend/after hours pager

## 2019-08-22 NOTE — Progress Notes (Signed)
PROGRESS NOTE  Brett Bright ZDG:[id number] DOB: Sep 07, 1960 DOA: 08/13/2019 PCP: Patient, No Pcp Per  Brief History   Brett Bright a 59 y.o.malewith medical history of Ulcerative colitis, upper GI bleed, portal vein thrombus, GERD presenting with over 1 week history of nausea, vomiting, and diarrhea. Patient was recently admitted to the hospital from 07/19/2019 to 07/20/2019 with ulcerative colitis flare.Hewas discharged home with a prednisone taper. He ultimately followed up with his gastroenterologist on 08/01/2019. He has Lialda was increased to 4.8 g daily, and he was instructed to continue on his prednisone taper. The patient states that he has tapered himself down to 10 mg of prednisone. He began his 10 mg dose on 08/10/2019. However, the patient has continued to have worsening nausea, vomiting, and diarrhea in the past week. He denies any hematemesis, hematochezia, melena, fevers, chills. He states that he has never had any abdominal pain even with his initial diagnosis of ulcerative colitis. He states that he does not have any abdominal pain at this point. He denies any fevers, chills, headache, chest pain, shortness breath, coughing, hemoptysis, hematemesis, dysuria, hematuria. He denies any new medications. He does not drink any alcohol or take any NSAIDs. In the emergency department, the patient has been afebrile and hemodynamically stable with oxygen saturation 96-100% on room air. WBC was initially 14.9 with hemoglobin 11.4, and platelets 275,000. Initial sodium was 127 with serum creatinine 0.79. CT of the abdomen and pelvis showed Gas and fluid in the LEFT retroperitoneum and extending into peritoneal spaces via mesenteric pathways in the LEFT abdomen, associated with interloop fluid and gas as well as loculated collections in the mesorectum.Because of the extensive distribution, septic thrombophlebitis was favored associated with local colonic perforation.There  was gas noted along the IMV at the site of the previous thrombus into the left retroperitoneum. [facility]However the patient continued to wait in the emergency department for over 24 hours. Subsequently, general surgery, Brett Bright contacted. He recommended transfer to a higher level of care. He ultimately spoke with general surgery, Brett Bright, at [facility] and the patient was transerred to [facility] on 08/13/2019.  The patient was admitted to a med/surg bed. He is receiving IV Zosyn and is being weaned off of TPN. The patient has had a drain placed in the peritoneal space by IR. This is continuing to drain fluid from the left abdominal abscess. Consultants  . Gastroenterology . General Surgery  Procedures  . Ct guided drainage of Left abdominal abscess  Antibiotics   Anti-infectives (From admission, onward)   Start     Dose/Rate Route Frequency Ordered Stop   08/19/19 1545  piperacillin-tazobactam (ZOSYN) IVPB 3.375 g     3.375 g 12.5 mL/hr over 240 Minutes Intravenous Every 8 hours 08/19/19 1533     08/13/19 2300  piperacillin-tazobactam (ZOSYN) IVPB 3.375 g  Status:  Discontinued     3.375 g 100 mL/hr over 30 Minutes Intravenous Every 8 hours 08/13/19 2149 08/19/19 1532   08/13/19 0930  piperacillin-tazobactam (ZOSYN) IVPB 3.375 g     3.375 g 100 mL/hr over 30 Minutes Intravenous  Once 08/13/19 0925 08/13/19 1638    .   Subjective  The patient is resting comfortably. No new complaints.  Objective   Vitals:  Vitals:   08/22/19 0353 08/22/19 1404  BP: 112/73 101/78  Pulse: 91 80  Resp: 16 15  Temp: 97.7 F (36.5 C) 98.4 F (36.9 C)  SpO2: 98% 95%   Exam:  Constitutional:  . The patient is awake, alert, and oriented x 3. No acute distress. Respiratory:  . No increased work of breathing. . No wheezes, rales, or rhonchi . No tactile fremitus Cardiovascular:  . Regular rate and  rhythm . No murmurs, ectopy, or gallups. . No lateral PMI. No thrills. Abdomen:  . Abdomen is soft, non-tender, non-distended . No hernias, masses, or organomegaly . Normoactive bowel sounds.  Musculoskeletal:  . No cyanosis, clubbing, or edema Skin:  . No rashes, lesions, ulcers . palpation of skin: no induration or nodules Neurologic:  . CN 2-12 intact . Sensation all 4 extremities intact Psychiatric:  . Mental status o Mood, affect appropriate o Orientation to person, place, time  . judgment and insight appear intact  I have personally reviewed the following:   Today's Data  . Vitals, CMP  Imaging  . CT abdomen and pelvis  Scheduled Meds: . Chlorhexidine Gluconate Cloth  6 each Topical Daily  . enoxaparin (LOVENOX) injection  40 mg Subcutaneous Daily  . insulin aspart  0-9 Units Subcutaneous 3 times per day  . sodium chloride flush  10-40 mL Intracatheter Q12H  . sodium chloride flush  5 mL Intracatheter Q8H   Continuous Infusions: . piperacillin-tazobactam (ZOSYN)  IV 3.375 g (08/22/19 1352)  . TPN ADULT (ION) 45 mL/hr at 08/22/19 1850    Principal Problem:   Colon perforation (HCC) Active Problems:   Hyponatremia   Hypokalemia   Nausea vomiting and diarrhea   Ulcerative colitis (HCC)   Iron deficiency anemia due to chronic blood loss   Dehydration   LOS: 8 days   A & P   Concern for peritonitis secondary to perforated colon/intraabdominal fluid collection: As noted on CT abdomen and pelvis.  Patient currently afebrile.  Normal white count.  Patient denies any abdominal pain.  Nausea vomiting diarrhea improving.  Patient initially seen by Brett Bright, general surgery at [facility] who discussed case with general surgery at [facility] [place] and patient transferred to [facility] for further surgical evaluation.  Patient seen in consultation by general surgery who feel patient is not toxic appearing and recommended management patient's  ulcerative colitis.  Patient currently tolerating full liquids.  Patient status post aspiration and drain placement of intra-abdominal fluid collection by IR, 08/16/2019 with cultures consistent with E. coli.  IV Solu-Medrol discontinued per GI. Per general surgery no surgical needs at this time.  Patient started on TPN.   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  Wall thickening throughout sigmoid colon and rectum improved.  Stable exophytic hypodense 2.2 cm renal cortical lesion in the anterior lower right kidney, indeterminate.  MRI abdomen without and with IV contrast recommended in the short-term outpatient basis for further characterization.  Small hiatial hernia.  Continue IV Zosyn. GI and general surgery following and appreciate input and recommendations.   Ulcerative colitis: Patient states some improvement with nausea and emesis.  Abdominal pain improved.  Loose stools improved.  CRP elevated at 10.1.  Sed rate elevated at 70.  Currently tolerating full liquids.  Patient was on  IV Solu-Medrol which was discontinued 08/15/2019 per GI.  Continue IV Zosyn. Patient seen and being followed by GI. Patient status post aspiration and drainage of intra-abdominal fluid collection 08/16/2019 per Brett Bright with purulent fluid noted in drain.  Intra-abdominal fluid cultures with E. coli with a mixed culture mixed anaerobic flora present.  Resistant to Bactrim otherwise sensitive.  Patient on TPN per GI recommendations.  On IV Zosyn.  GI  following and I appreciate the input and recommendations.  Hyponatremia: Improved. Na is 133 the morning of 08/21/2019.  Hypokalemia: Repleted.  Potassium at 4.3.  Magnesium at 2.2.  Electrolytes being repleted per pharmacy via TPN.  Follow.    Iron deficiency anemia anemia, unknown/folate deficiency: Likely secondary to problem #2.  Anemia panel consistent with anemia of chronic disease.  Folic acid level of 5.1.  Hemoglobin at 9.8.  Continue folate supplementation.  Severe protein calorie malnutrition: Continue TPN.  On full liquid diet.  Diet advancement per GI.  Follow.  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  2.2 cm renal lesion: Will need close outpatient follow-up with MRI with and without contrast for further characterization..  I have seen and examined this patient myself. I have spent 34 minutes in his evaluation and care.  DVT prophylaxis: SCDs Code Status full Family Communication: Updated patient.  No family at bedside. Disposition: The atient is from home. Anticipate discharge to home. Barriers to discharge completion of therapy and conversion off of TPN.Patient currently on IV antibiotics, status post CT-guided drain placement to abdominal abscess, being followed by general surgery and GI due to ulcerative colitis and concern for peritonitis.  Not ready for discharge.  Status is: Inpatient  Parnika Tweten, DO [facility] Direct contact: see [email or web address]  7PM-7AM contact night coverage as above 08/22/2019, 7:01 PM  LOS: 8 days

## 2019-08-22 NOTE — Progress Notes (Addendum)
Daily Rounding Note  08/22/2019, 11:40 AM  LOS: 8 days   SUBJECTIVE:   Chief complaint: perforated colon, presumed diverticulitis source.  Colon-rectal fistula, ? Due to diverticulitis as well  No abd pain, no nausea, tolerating diet.  TPN  Still in place.    OBJECTIVE:         Vital signs in last 24 hours:    Temp:  [97.7 F (36.5 C)-98.6 F (37 C)] 97.7 F (36.5 C) (06/02 0353) Pulse Rate:  [91-108] 91 (06/02 0353) Resp:  [16-18] 16 (06/02 0353) BP: (102-113)/(70-73) 112/73 (06/02 0353) SpO2:  [98 %] 98 % (06/02 0353) Last BM Date: 08/19/19 Filed Weights   08/13/19 0549 08/14/19 1959  Weight: 92.5 kg 92.5 kg   General: looks well   Heart: RRR Chest: clear bil Abdomen: soft, NT, ND.  Not tender.  Milky, watery drainage in abscess drain  Extremities: no CCE Neuro/Psych:  Alert, oriented x 3.  No tremors.  Not confused.    Intake/Output from previous day: 06/01 0701 - 06/02 0700 In: 3467.3 [P.O.:1100; I.V.:2170; IV Piggyback:187.3] Out: 45 [Drains:45]  Intake/Output this shift: No intake/output data recorded.  Lab Results: Recent Labs    08/20/19 0409  WBC 4.5  HGB 9.8*  HCT 31.6*  PLT 276   BMET Recent Labs    08/20/19 0409 08/21/19 0401  NA 134* 133*  K 4.3 4.3  CL 101 101  CO2 26 25  GLUCOSE 127* 134*  BUN 8 8  CREATININE 0.56* 0.61  CALCIUM 8.1* 8.2*   LFT Recent Labs    08/20/19 0409 08/21/19 0401  PROT 6.4* 6.6  ALBUMIN 1.8* 1.9*  AST 97* 87*  ALT 127* 145*  ALKPHOS 46 50  BILITOT 0.5 0.2*   PT/INR Recent Labs    08/21/19 0401  LABPROT 13.6  INR 1.1   Hepatitis Panel No results for input(s): HEPBSAG, HCVAB, HEPAIGM, HEPBIGM in the last 72 hours.  Studies/Results: No results found.   Scheduled Meds: . Chlorhexidine Gluconate Cloth  6 each Topical Daily  . enoxaparin (LOVENOX) injection  40 mg Subcutaneous Daily  . insulin aspart  0-9 Units Subcutaneous 3 times  per day  . sodium chloride flush  10-40 mL Intracatheter Q12H  . sodium chloride flush  5 mL Intracatheter Q8H   Continuous Infusions: . piperacillin-tazobactam (ZOSYN)  IV 3.375 g (08/22/19 0558)  . TPN ADULT (ION) 90 mL/hr at 08/22/19 0645  . TPN ADULT (ION)     PRN Meds:.acetaminophen **OR** acetaminophen, morphine, ondansetron **OR** ondansetron (ZOFRAN) IV, oxyCODONE, sodium chloride flush   ASSESMENT:   *    Perforated colon with intra-abdominal, retroperitoneal abscesses/fluid collections.  Drain placed to fluid collection in LLQ last week. Latest CT of 5/31 shows significant reduction in size of the LLQ abscess following drain placement.  There is some persistent but improved fat stranding, phlegmon in left small bowel mesenteric.  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  Reactive small bowel inflammation, and rectal inflammation also improved. GI, Dr. Rush Landmark suspects diverticulitis was initiating event leading to perf, abscesses, fistula and UC was not the cause/    *    Protein malnutrition, moderate, on TPN since 5/21. Pre-albumin improving.    *    IMV thrombosis?, previous portal thrombosis.  SQ Lovenox in place.  No anti-coagulant meds in past.    *   Elevated LFTs.  Suspect TPN contributing but AST/ALT mildly elevated as far back as  03/2016.  On CT of 4/29 and 5/24 had mildly heterogeneous appearance of the anterior posterior division superior aspect RIGHT hemi liver likely related to prior portal thrombosis  Liver unremarkable on 5/30 CT  *    Indeterminant 2.2 cm exophytic lesion lower right kidney.  MRI recommended for further evaluation.   *    Anemia.  Normocytic, stable.  Was iron def in 11/2018 but not now.     PLAN   *   GI signing off Mgt of drain per IR. Will remain in place at discharge.    Leave off Lialda for now.   6 months of AC for ? IMV thrombosis DC (wean?) off TPN.  Messaged the Pharm D about this  Gi fup w PA Lemmons on 7/1 at 10  AM.   Surgical and IR fup as well.      Azucena Freed  08/22/2019, 11:40 AM Phone [phone]   Attending physician's note   I have taken an interval history, reviewed the chart and examined the patient. I agree with the Advanced Practitioner's note, impression and recommendations.   Damaris Hippo , MD [phone]

## 2019-08-23 ENCOUNTER — Other Ambulatory Visit: Payer: Self-pay | Admitting: Physician Assistant

## 2019-08-23 DIAGNOSIS — R198 Other specified symptoms and signs involving the digestive system and abdomen: Secondary | ICD-10-CM

## 2019-08-23 LAB — COMPREHENSIVE METABOLIC PANEL
ALT: 96 U/L — ABNORMAL HIGH (ref 0–44)
AST: 39 U/L (ref 15–41)
Albumin: 2.2 g/dL — ABNORMAL LOW (ref 3.5–5.0)
Alkaline Phosphatase: 57 U/L (ref 38–126)
Anion gap: 5 (ref 5–15)
BUN: 13 mg/dL (ref 6–20)
CO2: 25 mmol/L (ref 22–32)
Calcium: 8.4 mg/dL — ABNORMAL LOW (ref 8.9–10.3)
Chloride: 104 mmol/L (ref 98–111)
Creatinine, Ser: 0.73 mg/dL (ref 0.61–1.24)
GFR calc Af Amer: >60 mL/min (ref >60)
GFR calc non Af Amer: >60 mL/min (ref >60)
Glucose, Bld: 115 mg/dL — ABNORMAL HIGH (ref 70–99)
Potassium: 4.2 mmol/L (ref 3.5–5.1)
Sodium: 134 mmol/L — ABNORMAL LOW (ref 135–145)
Total Bilirubin: 0.1 mg/dL — ABNORMAL LOW (ref 0.3–1.2)
Total Protein: 7.1 g/dL (ref 6.5–8.1)

## 2019-08-23 LAB — GLUCOSE, CAPILLARY
Glucose-Capillary: 114 mg/dL — ABNORMAL HIGH (ref 70–99)
Glucose-Capillary: 130 mg/dL — ABNORMAL HIGH (ref 70–99)
Glucose-Capillary: 108 mg/dL — ABNORMAL HIGH (ref 70–99)

## 2019-08-23 LAB — MAGNESIUM: Magnesium: 2.2 mg/dL (ref 1.7–2.4)

## 2019-08-23 LAB — PHOSPHORUS: Phosphorus: 3.9 mg/dL (ref 2.5–4.6)

## 2019-08-23 MED ORDER — AMOXICILLIN-POT CLAVULANATE 875-125 MG PO TABS
1.0000 | ORAL_TABLET | Freq: Two times a day (BID) | ORAL | Status: DC
  Administered 2019-08-23 – 2019-08-24 (×3): 1 via ORAL
  Filled 2019-08-23 (×3): qty 1

## 2019-08-23 MED ORDER — SODIUM CHLORIDE 0.9% FLUSH: 10.0000 mL | INTRAVENOUS | Status: DC | PRN

## 2019-08-23 NOTE — Progress Notes (Signed)
PHARMACY - TOTAL PARENTERAL NUTRITION CONSULT NOTE   Indication: severe malnutrition  Patient Measurements: Height: 5' 9"  (175.3 cm) Weight: 92.5 kg (203 lb 14.8 oz) IBW/kg (Calculated) : 70.7 TPN AdjBW (KG): 76.2 Body mass index is 30.11 kg/m.  Assessment: 78 yom with PMH UC, portal vein thrombosis, and laparoscopic converted to open cholecystectomy presenting to [facility] from [facility] for UC with L intra-abdominal fluid collection, suspected secondary to descending colonic perforation. Patient acute worsening of nausea and bilious emesis x 1 mo, non-bloody diarrhea 3-4x daily, and hematuria. Also reports poor PO intake over the last month. Patient reports wanting to eat but GI feels TPN appropriate for now to enhance ability to heal, allowing CLD for now pre-op.  Glucose / Insulin: No hx DM. CBGs controlled. 1 units insulin aspart in last 24 hours Electrolytes: Na 134. Phos 3.9 wnl - trending up. Others WNL.  Renal: SCr stable wnl, BUN wnl LFTs / TGs: LFTs now trending down - AST now 39, ALT 96, Tbili / TG wnl  Prealbumin / albumin: prealbumin 7.3>>12, albumin 2.2 Intake / Output; MIVF: Having multiple loose BMs, Drain 53 ml/24hr output, positive 23L (does not appear volume overload) GI Imaging: 5/24 CT abd pelvis - L intra-abdominal gas/fluid collection, ?septic thrombophlebitis, ?colonic perforation 5/31 CT abd pelvis - reduction in abdominal abscess, no new collections, persistent fistula with decreased fluid Surgeries / Procedures: 5/27 - IR for drain placement  Central access: PICC placed 08/16/19 TPN start date: 08/16/19  Nutritional Goals (per RD recommendation on 6/1): kCal: 2000-2200, Protein: 110-125g, Fluid: >2L  Current Nutrition:  TPN - reduced to 1/2 goal rate on 6/2 FLD advanced to soft diet on 6/1 - eating 75% of meals per pt and reports back to eating his normal amount prior to hospitalization when healthy   Magic cups bid with meals (provides 290 kcal and 9g protein)  Plan:   - Reduce TPN to 22 mL/hr at 1600 and discontinue TPN at 1800 (per discussion with surgery).  - Discontinue SSI and CBG monitoring as CBGs are well controlled and minimal insulin required.  Thank you for allowing pharmacy to be a part of this patient's care.  [name], PharmD PGY1 Pharmacy Resident [employer]: [phone]  08/23/2019 6:42 AM

## 2019-08-23 NOTE — Plan of Care (Signed)
  Problem: Education: Goal: Knowledge of General Education information will improve Description Including pain rating scale, medication(s)/side effects and non-pharmacologic comfort measures Outcome: Progressing   

## 2019-08-23 NOTE — Progress Notes (Signed)
PROGRESS NOTE  Brett Bright XIP:[id number] DOB: 1961-02-13 DOA: 08/13/2019 PCP: Patient, No Pcp Per  Brief History   Brett L Howertonis a 59 y.o.malewith medical history of Ulcerative colitis, upper GI bleed, portal vein thrombus, GERD presenting with over 1 week history of nausea, vomiting, and diarrhea. Patient was recently admitted to the hospital from 07/19/2019 to 07/20/2019 with ulcerative colitis flare.Hewas discharged home with a prednisone taper. He ultimately followed up with his gastroenterologist on 08/01/2019. He has Lialda was increased to 4.8 g daily, and he was instructed to continue on his prednisone taper. The patient states that he has tapered himself down to 10 mg of prednisone. He began his 10 mg dose on 08/10/2019. However, the patient has continued to have worsening nausea, vomiting, and diarrhea in the past week. He denies any hematemesis, hematochezia, melena, fevers, chills. He states that he has never had any abdominal pain even with his initial diagnosis of ulcerative colitis. He states that he does not have any abdominal pain at this point. He denies any fevers, chills, headache, chest pain, shortness breath, coughing, hemoptysis, hematemesis, dysuria, hematuria. He denies any new medications. He does not drink any alcohol or take any NSAIDs. In the emergency department, the patient has been afebrile and hemodynamically stable with oxygen saturation 96-100% on room air. WBC was initially 14.9 with hemoglobin 11.4, and platelets 275,000. Initial sodium was 127 with serum creatinine 0.79. CT of the abdomen and pelvis showed Gas and fluid in the LEFT retroperitoneum and extending into peritoneal spaces via mesenteric pathways in the LEFT abdomen, associated with interloop fluid and gas as well as loculated collections in the mesorectum.Because of the extensive distribution, septic thrombophlebitis was favored associated with local colonic perforation.There  was gas noted along the IMV at the site of the previous thrombus into the left retroperitoneum. [facility]However the patient continued to wait in the emergency department for over 24 hours. Subsequently, general surgery, Dr. Newton Pigg contacted. He recommended transfer to a higher level of care. He ultimately spoke with general surgery, Dr. Jaymes Graff, at [facility] and the patient was transerred to [facility] on 08/13/2019.  The patient was admitted to a med/surg bed. He is receiving IV Zosyn and is being weaned off of TPN. The patient has had a drain placed in the peritoneal space by IR. This is continuing to drain fluid from the left abdominal abscess. This is to be managed by IR. The patient will be converted from IV to oral antibiotics today. He will also be weaned off of TPN today. He is having normal bowel movements and is tolerating his diet well.  Consultants   Gastroenterology  General Surgery  Procedures   CT guided drainage of Left abdominal abscess  Antibiotics   Anti-infectives (From admission, onward)   Start     Dose/Rate Route Frequency Ordered Stop   08/23/19 1000  amoxicillin-clavulanate (AUGMENTIN) 875-125 MG per tablet 1 tablet     1 tablet Oral Every 12 hours 08/23/19 0758     08/19/19 1545  piperacillin-tazobactam (ZOSYN) IVPB 3.375 g  Status:  Discontinued     3.375 g 12.5 mL/hr over 240 Minutes Intravenous Every 8 hours 08/19/19 1533 08/23/19 0758   08/13/19 2300  piperacillin-tazobactam (ZOSYN) IVPB 3.375 g  Status:  Discontinued     3.375 g 100 mL/hr over 30 Minutes Intravenous Every 8 hours 08/13/19 2149 08/19/19 1532   08/13/19 0930  piperacillin-tazobactam (ZOSYN) IVPB  3.375 g     3.375 g 100 mL/hr over 30 Minutes Intravenous  Once 08/13/19 1610 08/13/19 1638      Subjective  The patient is resting comfortably. No new complaints.  Objective   Vitals:  Vitals:   08/23/19  0446 08/23/19 1551  BP: 108/73 109/73  Pulse: 91 (!) 101  Resp: 17 18  Temp: 97.6 F (36.4 C) 98.4 F (36.9 C)  SpO2: 96% 98%   Exam:  Constitutional:   The patient is awake, alert, and oriented x 3. No acute distress. Respiratory:   No increased work of breathing.  No wheezes, rales, or rhonchi  No tactile fremitus Cardiovascular:   Regular rate and rhythm  No murmurs, ectopy, or gallups.  No lateral PMI. No thrills. Abdomen:   Abdomen is soft, non-tender, non-distended  No hernias, masses, or organomegaly  Normoactive bowel sounds.  Musculoskeletal:   No cyanosis, clubbing, or edema Skin:   No rashes, lesions, ulcers  palpation of skin: no induration or nodules Neurologic:   CN 2-12 intact  Sensation all 4 extremities intact Psychiatric:   Mental status o Mood, affect appropriate o Orientation to person, place, time   judgment and insight appear intact  I have personally reviewed the following:   Today's Data   Vitals, CMP  Imaging   CT abdomen and pelvis  Scheduled Meds:  amoxicillin-clavulanate  1 tablet Oral Q12H   Chlorhexidine Gluconate Cloth  6 each Topical Daily   enoxaparin (LOVENOX) injection  40 mg Subcutaneous Daily   sodium chloride flush  10-40 mL Intracatheter Q12H   sodium chloride flush  5 mL Intracatheter Q8H   Continuous Infusions:  TPN ADULT (ION) 45 mL/hr at 08/23/19 0600    Principal Problem:   Colon perforation (HCC) Active Problems:   Hyponatremia   Hypokalemia   Nausea vomiting and diarrhea   Ulcerative colitis (HCC)   Iron deficiency anemia due to chronic blood loss   Dehydration   LOS: 9 days   A & P   Concern for peritonitis secondary to perforated colon/intraabdominal fluid collection: As noted on CT abdomen and pelvis.  Patient currently afebrile.  Normal white count.  Patient denies any abdominal pain.  Nausea vomiting diarrhea improving.  Patient initially seen by Dr. Arnoldo Morale, general  surgery at [facility] who discussed case with general surgery at [facility] [place] and patient transferred to [facility] for further surgical evaluation.  Patient seen in consultation by general surgery who feel patient is not toxic appearing and recommended management patient's ulcerative colitis.  Patient currently tolerating full liquids.  Patient status post aspiration and drain placement of intra-abdominal fluid collection by IR, 08/16/2019 with cultures consistent with E. coli.  IV Solu-Medrol discontinued per GI. Per general surgery no surgical needs at this time.  Patient started on TPN.   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  Wall thickening throughout sigmoid colon and rectum improved.  Stable exophytic hypodense 2.2 cm renal cortical lesion in the anterior lower right kidney, indeterminate.  MRI abdomen without and with IV contrast recommended in the short-term outpatient basis for further characterization.  Small hiatial hernia. The patient will be converted to oral antibiotics today be surgery. He will need to complete 14 days of antibiotics. He will be weaned off of TPN today. He is having BM's and is tolerating his diet. IR to manage drain. GI and general surgery following and appreciate input and recommendations.   Ulcerative colitis: Patient states some improvement with nausea and emesis.  Abdominal pain improved.  Loose stools improved.  CRP elevated at 10.1.  Sed rate elevated at 70.  Currently  tolerating full liquids.  Patient was on IV Solu-Medrol which was discontinued 08/15/2019 per GI.  Continue IV Zosyn. Patient seen and being followed by GI. Patient status post aspiration and drainage of intra-abdominal fluid collection 08/16/2019 per Dr. Earleen Newport with purulent fluid noted in drain.  Intra-abdominal fluid cultures with E. coli with a mixed culture mixed anaerobic flora present.  Resistant to Bactrim otherwise sensitive.  Patient to be weaned off of TPN today per general surgery.  To be converted to oral antibiotics today.  GI following and I appreciate the input and recommendations.  Hyponatremia: Resolved. Na is 134 the morning of 08/23/2019.  Hypokalemia: Repleted.  Potassium at 4.2.  Magnesium at 2.2.  Electrolytes being repleted per pharmacy via TPN.  Follow.    Iron deficiency anemia anemia, unknown/folate deficiency: Likely secondary to problem #2.  Anemia panel consistent with anemia of chronic disease.  Folic acid level of 5.1.  Hemoglobin at 9.8.  Continue folate supplementation.  Severe protein calorie malnutrition: Now tolerating regular diet. TPN to be discontinued.   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 I have a call in to Dr. Silverio Decamp regarding plan for anticoagulation at discharge.  2.2 cm renal lesion: Will need close outpatient follow-up with MRI with and without contrast for further characterization..  I have seen and examined this patient myself. I have spent 38 minutes in his evaluation and care.  DVT prophylaxis: SCDs Code Status full Family Communication: Updated patient.  No family at bedside. Disposition: The patient is from home. Anticipate discharge to home. Barriers to discharge completion of therapy and conversion off of TPN.Patient currently on IV antibiotics, status post CT-guided drain placement to abdominal abscess, being followed by general surgery and GI due to ulcerative colitis and concern for peritonitis.  Not ready for  discharge.  Status is: Inpatient  Keeley Sussman, DO [facility] Direct contact: see [email or web address]  7PM-7AM contact night coverage as above 08/23/2019, 4:39 PM  LOS: 8 days

## 2019-08-23 NOTE — Progress Notes (Signed)
Subjective: CC: Doing well. Tolerating diet and finishing most of his trays. No abdominal pain, n/v. Continues to have bowel movements. Notes normal BM this am.   Objective: Vital signs in last 24 hours: Temp:  [97.6 F (36.4 C)-98.6 F (37 C)] 97.6 F (36.4 C) (06/03 0446) Pulse Rate:  [80-98] 91 (06/03 0446) Resp:  [15-17] 17 (06/03 0446) BP: (101-119)/(73-81) 108/73 (06/03 0446) SpO2:  [95 %-98 %] 96 % (06/03 0446) Last BM Date: 08/22/19  Intake/Output from previous day: 06/02 0701 - 06/03 0700 In: 2613.2 [P.O.:1217; I.V.:1205.3; IV Piggyback:175.9] Out: 53 [Drains:53] Intake/Output this shift: No intake/output data recorded.  PE: Gen: Alert, NAD, pleasant Pulm:Normal rate and effort Abd: Soft, NT/ND, +BS, drain in place with purulent drainage in JP. 50cc/24 hours. Ext: No erythema, edema, or tenderness BUE/BLE  Psych: A&Ox3  Skin: no rashes noted, warm and dry  Lab Results:  No results for input(s): WBC, HGB, HCT, PLT in the last 72 hours. BMET Recent Labs    08/21/19 0401 08/23/19 0436  NA 133* 134*  K 4.3 4.2  CL 101 104  CO2 25 25  GLUCOSE 134* 115*  BUN 8 13  CREATININE 0.61 0.73  CALCIUM 8.2* 8.4*   PT/INR Recent Labs    08/21/19 0401  LABPROT 13.6  INR 1.1   CMP     Component Value Date/Time   NA 134 (L) 08/23/2019 0436   K 4.2 08/23/2019 0436   CL 104 08/23/2019 0436   CO2 25 08/23/2019 0436   GLUCOSE 115 (H) 08/23/2019 0436   BUN 13 08/23/2019 0436   CREATININE 0.73 08/23/2019 0436   CREATININE 0.88 12/19/2018 1547   CALCIUM 8.4 (L) 08/23/2019 0436   PROT 7.1 08/23/2019 0436   ALBUMIN 2.2 (L) 08/23/2019 0436   AST 39 08/23/2019 0436   ALT 96 (H) 08/23/2019 0436   ALKPHOS 57 08/23/2019 0436   BILITOT 0.1 (L) 08/23/2019 0436   GFRNONAA >60 08/23/2019 0436   GFRAA >60 08/23/2019 0436   Lipase     Component Value Date/Time   LIPASE 51 08/13/2019 0609       Studies/Results: No results  found.  Anti-infectives: Anti-infectives (From admission, onward)   Start     Dose/Rate Route Frequency Ordered Stop   08/19/19 1545  piperacillin-tazobactam (ZOSYN) IVPB 3.375 g     3.375 g 12.5 mL/hr over 240 Minutes Intravenous Every 8 hours 08/19/19 1533     08/13/19 2300  piperacillin-tazobactam (ZOSYN) IVPB 3.375 g  Status:  Discontinued     3.375 g 100 mL/hr over 30 Minutes Intravenous Every 8 hours 08/13/19 2149 08/19/19 1532   08/13/19 0930  piperacillin-tazobactam (ZOSYN) IVPB 3.375 g     3.375 g 100 mL/hr over 30 Minutes Intravenous  Once 08/13/19 0925 08/13/19 1638       Assessment/Plan Renal Lesion - Per [facility] IMV thrombosis on prior CT - Not seen/mentioned on CT 5/31 GERD SevereMalnutrition -prealbumin12.0 (5/31), TPN Ulcerative Colitis- Diagnosed 07/2018, previously on Pepcid, Daily Iron, Lialda, oral prednisone daily. Sees Dr. Buford Dresser in [place] but was managed by Dr. Oneida Alar before she left the practice.Steroids stopped 5/27 per GI. Plans to follow up with [facility] as an outpatient.  Left perirectal and retroperitoneal/LUQ fluid collections, suspect secondary to descending colonic perforation - S/p IR drain 5/27, continue drain. Cx with E. Coli. Drain per IR - Cont abx for 14 days total. Switch to oral abx today - CT 5/31 with improvement - No immediate surgical  needs - Wean TPN off today - We will continue to follow  FEN: Soft, Boost, wean TPN off today ID: [id number] - 6/3 VTE: SCD's,Lovenox Foley: none Follow up: GI, [facility]  Plan: Wean TPN off today. Switch to oral abx. If tolerates, can be d/c'd tomorrow from our standpoint. Follow up with [facility] arranged. Per GI notes, he has an appointment on 7/1 in their office.    LOS: 9 days    Jillyn Ledger , [facility] 08/23/2019, 7:55 AM Please see Amion for pager number during day hours 7:00am-4:30pm

## 2019-08-23 NOTE — Progress Notes (Signed)
Referring Physician(s): Dr. Georgette Dover  Supervising Physician: Jacqulynn Cadet  Patient Status:  [facility] - In-pt  Chief Complaint:  F/U LLQ drain  Brief History:  Brett Bright is a 59 y.o. male who presented to the ED on 08/14/19 with nausea, vomiting and diarrhea.   CT scan 08/13/19 showed gas and fluid in the LEFT retroperitoneum and extending into peritoneal spaces via mesenteric pathways in the LEFT abdomen, associated with interloop fluid and gas as well as loculated collections in the mesorectum likely associated with focal colonic perforation.   He was recently admitted in April 2021 with an ulcerative colitis flare up and discharged on a prednisone taper.   He underwent image guided drain placement on 08/16/19 by Dr. Earleen Newport.  Subjective:  Sitting up in chair. Doing better. No new complaints.  Allergies: Patient has no known allergies.  Medications: Prior to Admission medications   Medication Sig Start Date End Date Taking? Authorizing Provider  acetaminophen (TYLENOL) 325 MG tablet Take 325 mg by mouth every 6 (six) hours as needed for fever.   Yes [provider]  Ascorbic Acid (VITAMIN C PO) Take 1 tablet by mouth daily. Drops daily    Yes [provider]  ferrous sulfate (FEROSUL) 325 (65 FE) MG tablet Take 1 tablet (325 mg total) by mouth daily with breakfast. 07/20/19  Yes Johnson, Clanford L, MD  mesalamine (LIALDA) 1.2 g EC tablet Take 4 tablets (4.8 g total) by mouth daily with breakfast. 07/20/19  Yes Johnson, Clanford L, MD  omeprazole (PRILOSEC) 20 MG capsule Take 20 mg by mouth daily.   Yes [provider]  predniSONE (DELTASONE) 10 MG tablet Take 4 PO QAM x7days, 3 PO QAM x7days, 2 PO QAM x7days, 1 po QAM x 7 Patient taking differently: Take 10-40 mg by mouth See admin instructions. Take 40 mg every morning for 7 days, then 30 mg every morning for 7 days, then 20 mg every morning for 7 days,  then 10 mg every morning for 7 days, then  stop. 07/21/19  Yes Johnson, Clanford L, MD     Vital Signs: BP 108/73 (BP Location: Left Arm)    Pulse 91    Temp 97.6 F (36.4 C) (Oral)    Resp 17    Ht 5' 9"  (1.753 m)    Wt 92.5 kg    SpO2 96%    BMI 30.11 kg/m   Physical Exam Constitutional:      Appearance: Normal appearance.  HENT:     Head: Normocephalic and atraumatic.  Cardiovascular:     Rate and Rhythm: Normal rate.  Pulmonary:     Effort: Pulmonary effort is normal. No respiratory distress.  Abdominal:     Palpations: Abdomen is soft.     Tenderness: There is no abdominal tenderness.     Comments: Left drain in place.  ~53 mL purulent output recorded  Skin:    General: Skin is warm and dry.  Neurological:     General: No focal deficit present.     Mental Status: He is alert and oriented to person, place, and time.  Psychiatric:        Mood and Affect: Mood normal.        Behavior: Behavior normal.        Thought Content: Thought content normal.        Judgment: Judgment normal.     Imaging: CT ABDOMEN PELVIS W CONTRAST  Result Date: 08/20/2019 CLINICAL DATA:  Inpatient. Ulcerative colitis. Interval  percutaneous left abdominal abscess drainage. EXAM: CT ABDOMEN AND PELVIS WITH CONTRAST TECHNIQUE: Multidetector CT imaging of the abdomen and pelvis was performed using the standard protocol following bolus administration of intravenous contrast. CONTRAST:  141m OMNIPAQUE IOHEXOL 300 MG/ML  SOLN COMPARISON:  08/13/2019 CT abdomen/pelvis. FINDINGS: Lower chest: No significant pulmonary nodules or acute consolidative airspace disease. Hepatobiliary: Normal liver size. No discrete liver mass. Cholecystectomy. No biliary ductal dilatation. Pancreas: Normal, with no mass or duct dilation. Spleen: Normal size. No mass. Adrenals/Urinary Tract: Normal adrenals. No hydronephrosis. Exophytic hyperdense 2.2 cm renal cortical lesion in the anterior lower right kidney (series 3/image 48), unchanged. Numerous simple renal cysts in  both kidneys, largest 11.9 cm in the upper right kidney. Several subcentimeter hypodense renal cortical lesions in both kidneys are too small to characterize. Normal bladder. Stomach/Bowel: Small hiatal hernia. Otherwise normal nondistended stomach. Normal caliber small bowel. Appendectomy. Scattered mild colonic diverticula. Wall thickening throughout sigmoid colon and rectum, improved. Interval placement of percutaneous pigtail drain in the complex left abdominal collection, which is significantly decreased, with no residual measurable collection surrounding the catheter. Mild reactive wall thickening in the left small bowel is improved. Residual tiny 2.6 x 1.5 cm posterior left peritoneal collection (series 3/image 52), decreased from 9.6 x 5.5 cm. 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 Persistent ill-defined fat stranding and phlegmonous change in the left small bowel mesentery, significantly improved (series 3/image 65). No new collections. Vascular/Lymphatic: Normal caliber abdominal aorta. Patent portal, splenic, hepatic and renal veins. No pathologically enlarged lymph nodes in the abdomen or pelvis. Reproductive: Normal size prostate.  No ascites. Other: No pneumoperitoneum, ascites or focal fluid collection. Musculoskeletal: No aggressive appearing focal osseous lesions. Moderate lumbar spondylosis. IMPRESSION: 1. Significant interval reduction in size of complex left abdominal abscess status post percutaneous pigtail drain placement, as detailed. 2. Persistent ill-defined fat stranding and phlegmonous change in the left small bowel mesentery, significantly improved. No new collections. 3. 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 4. Reactive left small bowel thickening is improved. Wall thickening throughout the sigmoid colon and rectum is improved. 5. Stable exophytic hyperdense 2.2 cm renal cortical lesion in the anterior lower right kidney, indeterminate. MRI abdomen without and with IV contrast is recommended on a short term outpatient basis for further characterization. 6. Small hiatal hernia. Electronically Signed   By: JIlona SorrelM.D.   On: 08/20/2019 08:25    Labs:  CBC: Recent Labs    08/15/19 0230 08/16/19 0203 08/17/19 0434 08/20/19 0409  WBC 4.2 5.1 6.9 4.5  HGB 10.0* 9.8* 10.4* 9.8*  HCT 31.9* 31.4* 33.2* 31.6*  PLT 238 269 311 276    COAGS: Recent Labs    08/16/19 0203 08/21/19 0401  INR 1.2 1.1    BMP: Recent Labs    08/19/19 0427 08/20/19 0409 08/21/19 0401 08/23/19 0436  NA 133* 134* 133* 134*  K 4.0 4.3 4.3 4.2  CL 103 101 101 104  CO2 25 26 25 25   GLUCOSE 131* 127* 134* 115*  BUN 7 8 8 13   CALCIUM 7.8* 8.1* 8.2* 8.4*  CREATININE 0.55* 0.56* 0.61 0.73  GFRNONAA >60 >60 >60 >60  GFRAA >60 >60 >60 >60    LIVER FUNCTION  TESTS: Recent Labs    08/17/19 0434 08/20/19 0409 08/21/19 0401 08/23/19 0436  BILITOT 0.4 0.5 0.2* 0.1*  AST 25 97* 87* 39  ALT 47* 127* 145* 96*  ALKPHOS 48 46 50 57  PROT 5.7* 6.4* 6.6 7.1  ALBUMIN 1.6* 1.8* 1.9* 2.2*    Assessment and Plan:  S/P drain placement on 08/16/19 by Dr. Earleen Newport for colonic perforation.  Doing well. Still with >50 mL purulent output per day.  I will arrange follow up in our drain clinic for about 2 weeks after discharge. They will call him with an appointment.  Nursing to please demonstrate flushing and emptying of the drain.  Electronically Signed: Murrell Redden, PA-C 08/23/2019, 2:04 PM    I spent a total of 15 Minutes at the the patient's bedside AND on the patient's hospital floor or unit, greater than 50% of which was  counseling/coordinating care for f/u drain for colonic perforation.

## 2019-08-24 LAB — GLUCOSE, CAPILLARY: Glucose-Capillary: 89 mg/dL (ref 70–99)

## 2019-08-24 MED ORDER — AMOXICILLIN-POT CLAVULANATE 875-125 MG PO TABS: 1.0000 | ORAL_TABLET | Freq: Two times a day (BID) | ORAL | 0 refills | Status: DC

## 2019-08-24 MED ORDER — SODIUM CHLORIDE 0.9% FLUSH: 5.0000 mL | Freq: Every day | INTRAVENOUS | 1 refills | Status: DC

## 2019-08-24 NOTE — Discharge Summary (Addendum)
Physician Discharge Summary  Brett Bright QBH:[id number] DOB: [birth year] DOA: 08/13/2019  PCP: Patient, No Pcp Per  Admit date: 08/13/2019 Discharge date: 08/24/2019  Recommendations for Outpatient Follow-up:  Follow up with PCP in 7-10 days after discharge. Discharge to home with home health RN Follow up with CRS as directed. Follow up with GI on 09/20/2019 Follow up in drain clinic in 2 weeks.  Follow-up Information     Carlis Stable, NP Follow up on 11/07/2019.   Specialty: Gastroenterology Why: keep this pre-scheduled appt at 10 AM.  you are also in line for a sooner visit and they will call you when that date/time is set.   Contact information: [address] [place] [address] [phone]         Michael Boston, MD. Go on 09/10/2019.   Specialty: General Surgery Why: at 930am. Please arrive to your appointment 30 minutes early for paperwork. Please bring a copy of your photo ID and insurance card.  Contact information: [address] [place] [address] [phone]           Discharge Diagnoses: Principal diagnosis is #1 Perforated colon/intra-abdominal abscess. 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 male with medical history of Ulcerative colitis, upper GI bleed, portal vein thrombus, GERD presenting with over 1 week history of nausea, vomiting, and diarrhea.  Patient was recently admitted to the hospital from 07/19/2019 to 07/20/2019 with ulcerative colitis flare.  He was discharged home with a prednisone taper.  He ultimately followed up with his gastroenterologist on  08/01/2019.  He has Lialda was increased to 4.8 g daily, and he was instructed to continue on his prednisone taper.  The patient states that he has tapered himself down to 10 mg of prednisone.  He began his 10 mg dose on 08/10/2019.  However, the patient has continued to have worsening nausea, vomiting, and diarrhea in the past week.  He denies any hematemesis, hematochezia, melena, fevers, chills.  He states that he has never had any abdominal pain even with his initial diagnosis of ulcerative colitis.  He states that he does not have any abdominal pain at this point.  He denies any fevers, chills, headache, chest pain, shortness breath, coughing, hemoptysis, hematemesis, dysuria, hematuria.  He denies any new medications.  He does not drink any alcohol or take any NSAIDs. In the emergency department, the patient has been afebrile and hemodynamically stable with oxygen saturation 96-100% on room air.  WBC was initially 14.9 with hemoglobin 11.4, and platelets 275,000.  Initial sodium was 127 with serum creatinine 0.79.  CT of the abdomen and pelvis showed Gas and fluid in the LEFT retroperitoneum and extending into peritoneal spaces via mesenteric pathways in the LEFT abdomen, associated with interloop fluid and gas as well as loculated collections in the mesorectum.  Because of the extensive distribution, septic thrombophlebitis was favored associated with local colonic perforation.  There was gas noted along the IMV at the site of the previous thrombus into the left retroperitoneum. Initial contact was made with [facility] whom had accepted the patient in transfer.  However the patient continued to wait in the emergency department  for over 24 hours.  Subsequently, general surgery, Dr. Aviva Signs was contacted.  He recommended transfer to a higher level of care.  He ultimately spoke with general surgery, Dr. Jaymes Graff, at [facility] and the plan is to transfer the patient to [facility] for further surgical  evaluation.  The patient was started on intravenous Zosyn and IV fluids.  Hospital Course: The patient was admitted to a med/surg bed. He is receiving IV Zosyn and is being weaned off of TPN. The patient has had a drain placed in the peritoneal space by IR. This is continuing to drain fluid from the left abdominal abscess. This is to be managed by IR. The patient has been converted from IV to oral antibiotics on 08/23/2019. He has also been weaned off of TPN on 08/23/2019. He is having normal bowel movements and is tolerating his diet well. He will be discharged to home today. The patient confirms that he knows how to care for the drain and to flush it.  Today's assessment: S: The patient is resting comfortably. No new complaints. O: Vitals:  Vitals:   08/23/19 2119 08/24/19 0440  BP: 115/78 107/77  Pulse: 96 (!) 102  Resp: 18   Temp: 97.7 F (36.5 C) 97.9 F (36.6 C)  SpO2: 98% 96%   Exam:  Constitutional:  The patient is awake, alert, and oriented x 3. No acute distress. Respiratory:  No increased work of breathing. No wheezes, rales, or rhonchi No tactile fremitus Cardiovascular:  Regular rate and rhythm No murmurs, ectopy, or gallups. No lateral PMI. No thrills. Abdomen:  Abdomen is soft, non-tender, non-distended No hernias, masses, or organomegaly Normoactive bowel sounds. Site of drain is covered with guaze that is clean and dry. They is purulent yellow fluid present in the bulb.  Musculoskeletal:  No cyanosis, clubbing, or edema Skin:  No rashes, lesions, ulcers palpation of skin: no induration or nodules Neurologic:  CN 2-12 intact Sensation all 4 extremities intact Psychiatric:  Mental status Mood, affect appropriate Orientation to person, place, time  judgment and insight appear intact   Discharge Instructions  Discharge Instructions     Activity as tolerated - No restrictions   Complete by: As directed    Call MD for:  persistant nausea and vomiting    Complete by: As directed    Call MD for:  redness, tenderness, or signs of infection (pain, swelling, redness, odor or green/yellow discharge around incision site)   Complete by: As directed    Call MD for:  severe uncontrolled pain   Complete by: As directed    Call MD for:  temperature >100.4   Complete by: As directed    Diet - low sodium heart healthy   Complete by: As directed    Discharge instructions   Complete by: As directed    Follow up with PCP in 7-10 days after discharge. Discharge to home with home health RN Follow up with [facility] as directed. Follow up with GI on 09/20/2019 Follow up in drain clinic in 2 weeks.   Discharge wound care:   Complete by: As directed    Remove guaze covering drain site daily or when soiled and replace it with a clean one.   Increase activity slowly   Complete by: As directed       Allergies as of 08/24/2019   No Known Allergies      Medication List     STOP taking these medications    predniSONE 10 MG  tablet Commonly known as: DELTASONE   VITAMIN C PO       TAKE these medications    acetaminophen 325 MG tablet Commonly known as: TYLENOL Take 325 mg by mouth every 6 (six) hours as needed for fever.   amoxicillin-clavulanate 875-125 MG tablet Commonly known as: AUGMENTIN Take 1 tablet by mouth every 12 (twelve) hours. Notes to patient: Next dose is due tonight at 10:00 PM   ferrous sulfate 325 (65 FE) MG tablet Commonly known as: FeroSul Take 1 tablet (325 mg total) by mouth daily with breakfast. Notes to patient: Resume to your regular schedule   mesalamine 1.2 g EC tablet Commonly known as: LIALDA Take 4 tablets (4.8 g total) by mouth daily with breakfast. Notes to patient: Resume to your regular schedule   omeprazole 20 MG capsule Commonly known as: PRILOSEC Take 20 mg by mouth daily. Notes to patient: Resume to your regular schedule   sodium chloride flush 0.9 % Soln Commonly known as: NS 5 mLs by  Intracatheter route daily. Notes to patient: Next time is due tomorrow                Discharge Care Instructions  (From admission, onward)           Start     Ordered   08/24/19 0000  Discharge wound care:    Comments: Remove guaze covering drain site daily or when soiled and replace it with a clean one.   08/24/19 1012           No Known Allergies  The results of significant diagnostics from this hospitalization (including imaging, microbiology, ancillary and laboratory) are listed below for reference.    Significant Diagnostic Studies: CT ABDOMEN PELVIS W CONTRAST  Result Date: 08/20/2019 CLINICAL DATA:  Inpatient. Ulcerative colitis. Interval percutaneous left abdominal abscess drainage. EXAM: CT ABDOMEN AND PELVIS WITH CONTRAST TECHNIQUE: Multidetector CT imaging of the abdomen and pelvis was performed using the standard protocol following bolus administration of intravenous contrast. CONTRAST:  170m OMNIPAQUE IOHEXOL 300 MG/ML  SOLN COMPARISON:  08/13/2019 CT abdomen/pelvis. FINDINGS: Lower chest: No significant pulmonary nodules or acute consolidative airspace disease. Hepatobiliary: Normal liver size. No discrete liver mass. Cholecystectomy. No biliary ductal dilatation. Pancreas: Normal, with no mass or duct dilation. Spleen: Normal size. No mass. Adrenals/Urinary Tract: Normal adrenals. No hydronephrosis. Exophytic hyperdense 2.2 cm renal cortical lesion in the anterior lower right kidney (series 3/image 48), unchanged. Numerous simple renal cysts in both kidneys, largest 11.9 cm in the upper right kidney. Several subcentimeter hypodense renal cortical lesions in both kidneys are too small to characterize. Normal bladder. Stomach/Bowel: Small hiatal hernia. Otherwise normal nondistended stomach. Normal caliber small bowel. Appendectomy. Scattered mild colonic diverticula. Wall thickening throughout sigmoid colon and rectum, improved. Interval placement of percutaneous  pigtail drain in the complex left abdominal collection, which is significantly decreased, with no residual measurable collection surrounding the catheter. Mild reactive wall thickening in the left small bowel is improved. Residual tiny 2.6 x 1.5 cm posterior left peritoneal collection (series 3/image 52), decreased from 9.6 x 5.5 cm. 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 Persistent ill-defined fat stranding and phlegmonous change in the left small bowel mesentery, significantly improved (series 3/image 65). No new collections. Vascular/Lymphatic: Normal caliber abdominal aorta. Patent portal, splenic, hepatic and renal  veins. No pathologically enlarged lymph nodes in the abdomen or pelvis. Reproductive: Normal size prostate.  No ascites. Other: No pneumoperitoneum, ascites or focal fluid collection. Musculoskeletal: No aggressive appearing focal osseous lesions. Moderate lumbar spondylosis. IMPRESSION: 1. Significant interval reduction in size of complex left abdominal abscess status post percutaneous pigtail drain placement, as detailed. 2. Persistent ill-defined fat stranding and phlegmonous change in the left small bowel mesentery, significantly improved. No new collections. 3. 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 4. Reactive left small bowel thickening is improved. Wall thickening throughout the sigmoid colon and rectum is improved. 5. Stable exophytic hyperdense 2.2 cm renal cortical lesion in the anterior lower right kidney, indeterminate. MRI abdomen without and with IV contrast is recommended on a short term outpatient basis for further characterization.  6. Small hiatal hernia. Electronically Signed   By: Ilona Sorrel M.D.   On: 08/20/2019 08:25   CT Abdomen Pelvis W Contrast  Result Date: 08/13/2019 CLINICAL DATA:  Vomiting diarrhea EXAM: CT ABDOMEN AND PELVIS WITH CONTRAST TECHNIQUE: Multidetector CT imaging of the abdomen and pelvis was performed using the standard protocol following bolus administration of intravenous contrast. CONTRAST:  137m OMNIPAQUE IOHEXOL 300 MG/ML  SOLN COMPARISON:  07/19/2019 FINDINGS: Lower chest: Lung bases are clear. No pleural or pericardial effusion. Hepatobiliary: Mildly heterogeneous appearance of the anterior posterior division superior aspect RIGHT hemi liver likely related to prior portal thrombosis as before. Lobular hepatic contours. Post cholecystectomy. Pancreas: Pancreas is normal. No ductal dilation, visible lesion or inflammation. Spleen: Spleen is normal size without focal lesion. Adrenals/Urinary Tract: Adrenal glands are normal. Large RIGHT renal cyst and areas of mixed attenuation as described on the recent CT evaluation. No hydronephrosis. Stomach/Bowel: Signs of IMV thrombosis obscured by portal venous gas that now extends into the inferior mesenteric vein. Venous systems draining both the rectum and sigmoid colon show gas within the lumen with irregular enhancing gas and fluid containing collections that involves the mesorectum and extend along the LEFT flank with interloop fluid and gas extending to the LEFT upper quadrant adjacent to the jejunum (image 57, series 2 4.6 x 3.1 cm gas and fluid containing collection. Gas and fluid in the LEFT hemiabdomen beneath the sigmoid colon and descending transition measuring approximately 9 cm in greatest length and 2.6 cm and with. Other small pockets of fluid and gas tracking in the sigmoid mesentery and beyond towards the jejunum as described. Collection in the mesorectum adjacent to the rectum along the LEFT lateral aspect of the rectum measures 4 x 3.7 cm. This and  many of the collections in the sigmoid mesocolon are ill-defined. Numerous lymph nodes throughout the retroperitoneum. Many of these are small. The largest in the LEFT retroperitoneum adjacent to the ureter and gonadal vessels measuring 1.3 cm. Vascular/Lymphatic: Gas dissecting around and likely within the lumen of the IMV which was previously shown to have thrombus in the lumen. Retroperitoneal nodal enlargement of a node adjacent to the LEFT common iliac and ureter as described. No pelvic adenopathy aside from this enlarged lymph node but with scattered lymph nodes throughout the sigmoid mesentery without pathologic enlargement but abnormal with respect to number. Reproductive: Heterogeneous prostate, nonspecific and unchanged. Other: Loculated retroperitoneal and interloop fluid collections as described. No free intraperitoneal air. Musculoskeletal: No acute musculoskeletal process. Spinal degenerative changes. IMPRESSION: 1. Gas and fluid in the LEFT retroperitoneum and extending into peritoneal spaces via mesenteric pathways  in the LEFT abdomen, associated with interloop fluid and gas as well as loculated collections in the mesorectum. Extensive distribution of this process favors septic thrombophlebitis, likely associated with focal colonic perforation. Process best seen on image 76 of series 2 were gas along the IMV at the site of previous thrombus tracks into the LEFT retroperitoneum, extending superiorly and inferiorly from this epicenter are numerous gas and fluid containing collections. 2. Again with focal colonic thickening in the setting of UC colonic neoplasm is considered, as discussed on the previous exam. 3. Secondary enteritis due to extensive inflammation. Enteritis within the jejunum on today's exam 4. Numerous lymph nodes throughout the retroperitoneum and sigmoid mesentery. Many of these are small and some are abnormal with respect to number. The largest in the LEFT retroperitoneum adjacent to  the ureter and gonadal vessels measuring 1.3 cm. 5. Large RIGHT renal cyst and areas of mixed attenuation as described on the recent CT evaluation. Follow-up renal protocol CT when the patient is able is suggested to exclude the possibility of new enhancing lesions, solid renal neoplasm, as discussed on the previous CT evaluation. 6. Mildly heterogeneous appearance of the anterior posterior division superior aspect RIGHT hemi liver likely related to prior portal thrombosis as before. Critical Value/emergent results were called by telephone at the time of interpretation on 08/13/2019 at 8:58 am to provider Dr. Eulis Foster, Who verbally acknowledged these results. Electronically Signed   By: Zetta Bills M.D.   On: 08/13/2019 08:58   CT IMAGE GUIDED DRAINAGE BY PERCUTANEOUS CATHETER  Result Date: 08/16/2019 INDICATION: 59 year old male with a history of diverticular abscess EXAM: CT GUIDED DRAINAGE OF LEFT ABDOMINAL ABSCESS MEDICATIONS: The patient is currently admitted to the hospital and receiving intravenous antibiotics. The antibiotics were administered within an appropriate time frame prior to the initiation of the procedure. ANESTHESIA/SEDATION: 1.0 mg IV Versed 50 mcg IV Fentanyl Moderate Sedation Time:  45 minutes The patient was continuously monitored during the procedure by the interventional radiology nurse under my direct supervision. COMPLICATIONS: None TECHNIQUE: Informed written consent was obtained from the patient after a thorough discussion of the procedural risks, benefits and alternatives. All questions were addressed. Maximal Sterile Barrier Technique was utilized including caps, mask, sterile gowns, sterile gloves, sterile drape, hand hygiene and skin antiseptic. A timeout was performed prior to the initiation of the procedure. PROCEDURE: The left abdomen was prepped with chlorhexidine in a sterile fashion, and a sterile drape was applied covering the operative field. A sterile gown and sterile  gloves were used for the procedure. Local anesthesia was provided with 1% Lidocaine. Using CT guidance, trocar needle was advanced into the abscess from an anterior approach. Modified Seldinger technique was then used to place a 10 [place] drain into the abscess. Approximately 35 cc of purulent material aspirated. Drain was attached to bulb suction. Drain was sutured in position and a final image was stored. Patient tolerated the procedure well and remained hemodynamically stable throughout. No complications were encountered and no significant blood loss. FINDINGS: CT of the abdomen demonstrates persisting abscess of the left abdomen, compatible with diverticular abscess. CT scan continuing through the pelvis demonstrates significant reduction in the small fluid collection in the left perirectal space. We elected not to proceed with attempted drainage of this small residual inflammatory change. IMPRESSION: Status post CT-guided drainage of left abdominal abscess. Signed, Dulcy Fanny. Dellia Nims, RPVI [facility] Radiology Electronically Signed   By: Corrie Mckusick D.O.   On: 08/16/2019 15:42  [place] EKG SITE RITE  Result Date: 08/16/2019 If Site Rite image not attached, placement could not be confirmed due to current cardiac rhythm.   Microbiology: Recent Results (from the past 240 hour(s))  Aerobic/Anaerobic Culture (surgical/deep wound)     Status: None   Collection Time: 08/16/19  2:27 PM   Specimen: Abscess  Result Value Ref Range Status   Specimen Description ABSCESS  Final   Special Requests NONE  Final   Gram Stain   Final    ABUNDANT WBC PRESENT, PREDOMINANTLY PMN ABUNDANT GRAM NEGATIVE RODS ABUNDANT GRAM POSITIVE COCCI IN PAIRS IN CHAINS MODERATE GRAM POSITIVE RODS Performed at [facility], [address], [place], [place] [address]    Culture   Final    RARE ESCHERICHIA COLI WITH IN MIXED CULTURE MIXED ANAEROBIC FLORA PRESENT.  CALL  LAB IF FURTHER IID REQUIRED.    Report Status 08/20/2019 FINAL  Final   Organism ID, Bacteria ESCHERICHIA COLI  Final      Susceptibility   Escherichia coli - MIC*    AMPICILLIN <=2 SENSITIVE Sensitive     CEFAZOLIN <=4 SENSITIVE Sensitive     CEFEPIME <=1 SENSITIVE Sensitive     CEFTAZIDIME <=1 SENSITIVE Sensitive     CEFTRIAXONE <=1 SENSITIVE Sensitive     CIPROFLOXACIN <=0.25 SENSITIVE Sensitive     GENTAMICIN 4 SENSITIVE Sensitive     IMIPENEM <=0.25 SENSITIVE Sensitive     TRIMETH/SULFA >=320 RESISTANT Resistant     AMPICILLIN/SULBACTAM <=2 SENSITIVE Sensitive     PIP/TAZO <=4 SENSITIVE Sensitive     * RARE ESCHERICHIA COLI     Labs: Basic Metabolic Panel: Recent Labs  Lab 08/18/19 0434 08/19/19 0427 08/20/19 0409 08/21/19 0401 08/23/19 0436  NA 132* 133* 134* 133* 134*  K 3.7 4.0 4.3 4.3 4.2  CL 101 103 101 101 104  CO2 23 25 26 25 25   GLUCOSE 120* 131* 127* 134* 115*  BUN 7 7 8 8 13   CREATININE 0.59* 0.55* 0.56* 0.61 0.73  CALCIUM 7.7* 7.8* 8.1* 8.2* 8.4*  MG 1.8 2.1 2.0 2.2 2.2  PHOS 2.6 2.8 3.3 3.8 3.9   Liver Function Tests: Recent Labs  Lab 08/20/19 0409 08/21/19 0401 08/23/19 0436  AST 97* 87* 39  ALT 127* 145* 96*  ALKPHOS 46 50 57  BILITOT 0.5 0.2* 0.1*  PROT 6.4* 6.6 7.1  ALBUMIN 1.8* 1.9* 2.2*   No results for input(s): LIPASE, AMYLASE in the last 168 hours. No results for input(s): AMMONIA in the last 168 hours. CBC: Recent Labs  Lab 08/20/19 0409  WBC 4.5  NEUTROABS 3.2  HGB 9.8*  HCT 31.6*  MCV 86.3  PLT 276   Cardiac Enzymes: No results for input(s): CKTOTAL, CKMB, CKMBINDEX, TROPONINI in the last 168 hours. BNP: BNP (last 3 results) No results for input(s): BNP in the last 8760 hours.  ProBNP (last 3 results) No results for input(s): PROBNP in the last 8760 hours.  CBG: Recent Labs  Lab 08/22/19 2150 08/23/19 0448 08/23/19 1159 08/23/19 1820 08/24/19 1248  GLUCAP 118* 114* 130* 108* 89    Principal Problem:    Colon perforation (HCC) Active Problems:   Hyponatremia   Hypokalemia   Nausea vomiting and diarrhea   Ulcerative colitis (HCC)   Iron deficiency anemia due to chronic blood loss   Dehydration  Time coordinating discharge: 38 minutes.  Signed:        Alvah Gilder, DO [facility]  08/24/2019, 2:03 PM

## 2019-08-24 NOTE — Progress Notes (Signed)
Nutrition Follow-up  DOCUMENTATION CODES:   Not applicable  INTERVENTION:   -Continue Magic cup TID with meals, each supplement provides 290 kcal and 9 grams of protein -MVI with minerals dailty  NUTRITION DIAGNOSIS:   Increased nutrient needs related to acute illness(peritonitis and intra-abdominal abscess) as evidenced by estimated needs.  Ongoing  GOAL:   Patient will meet greater than or equal to 90% of their needs  Progressing   MONITOR:   PO intake, Supplement acceptance, Diet advancement, Labs, Weight trends, Skin, I & O's  REASON FOR ASSESSMENT:   Consult New TPN/TNA  ASSESSMENT:   Brett Bright is a 59 y.o. male with medical history ofUlcerative colitis, upper GI bleed, portal vein thrombus, GERD presenting with over 1 week history of nausea, vomiting, and diarrhea.  5/27- s/pCT guided drainage of Left Abd abscess, 24F drain; PICC placed; TPN initiated 5/30- advanced to full liquids 6/1- advanced to soft diet 6/3- TPN d/c  Reviewed I/O's: +1.4 L x 24 hours and +24.3 L since admission  Drain output: 20 ml x 24 hours  RD attempted to speak with pt via phone, however, no answer.   TPN d/c yesterday. Pt remains with good appetite; noted meal completion 75-100%.  Per general surgery notes, likely d/c to home today.  Labs reviewed: Na: 134. K, Mg, and Phos WDL.   Diet Order:   Diet Order            Diet - low sodium heart healthy        DIET SOFT Room service appropriate? Yes; Fluid consistency: Thin  Diet effective now              EDUCATION NEEDS:   Not appropriate for education at this time  Skin:  Skin Assessment: Skin Integrity Issues: Skin Integrity Issues:: Other (Comment) Other: LUQ drain puncture site  Last BM:  08/24/19  Height:   Ht Readings from Last 1 Encounters:  08/14/19 5' 9"  (1.753 m)    Weight:   Wt Readings from Last 1 Encounters:  08/14/19 92.5 kg    Ideal Body Weight:  72.7 kg  BMI:  Body mass index  is 30.11 kg/m.  Estimated Nutritional Needs:   Kcal:  2000-2200  Protein:  110-125 grams  Fluid:  > 2 L    [name], RD, LDN, [place] Registered Dietitian II Certified Diabetes Care and Education Specialist Please refer to [facility] for RD and/or RD on-call/weekend/after hours pager

## 2019-08-24 NOTE — Progress Notes (Signed)
Subjective: CC: Patient is tolerating diet without n/v, abdominal pain. Last BM this morning and normal.   Objective: Vital signs in last 24 hours: Temp:  [97.7 F (36.5 C)-98.4 F (36.9 C)] 97.9 F (36.6 C) (06/04 0440) Pulse Rate:  [96-102] 102 (06/04 0440) Resp:  [18] 18 (06/03 2119) BP: (107-115)/(73-78) 107/77 (06/04 0440) SpO2:  [96 %-98 %] 96 % (06/04 0440) Last BM Date: 08/23/19  Intake/Output from previous day: 06/03 0701 - 06/04 0700 In: 1390 [P.O.:1380] Out: 20 [Drains:20] Intake/Output this shift: No intake/output data recorded.  PE: Gen: Alert, NAD, pleasant Pulm:Normal rate and effort Abd: Soft, NT/ND, +BS, drain in place with purulent drainage in JP.20cc/24 hours. Ext: No erythema, edema, or tenderness BUE/BLE  Psych: A&Ox3  Skin: no rashes noted, warm and dry  Lab Results:  No results for input(s): WBC, HGB, HCT, PLT in the last 72 hours. BMET Recent Labs    08/23/19 0436  NA 134*  K 4.2  CL 104  CO2 25  GLUCOSE 115*  BUN 13  CREATININE 0.73  CALCIUM 8.4*   PT/INR No results for input(s): LABPROT, INR in the last 72 hours. CMP     Component Value Date/Time   NA 134 (L) 08/23/2019 0436   K 4.2 08/23/2019 0436   CL 104 08/23/2019 0436   CO2 25 08/23/2019 0436   GLUCOSE 115 (H) 08/23/2019 0436   BUN 13 08/23/2019 0436   CREATININE 0.73 08/23/2019 0436   CREATININE 0.88 12/19/2018 1547   CALCIUM 8.4 (L) 08/23/2019 0436   PROT 7.1 08/23/2019 0436   ALBUMIN 2.2 (L) 08/23/2019 0436   AST 39 08/23/2019 0436   ALT 96 (H) 08/23/2019 0436   ALKPHOS 57 08/23/2019 0436   BILITOT 0.1 (L) 08/23/2019 0436   GFRNONAA >60 08/23/2019 0436   GFRAA >60 08/23/2019 0436   Lipase     Component Value Date/Time   LIPASE 51 08/13/2019 0609       Studies/Results: No results found.  Anti-infectives: Anti-infectives (From admission, onward)   Start     Dose/Rate Route Frequency Ordered Stop   08/23/19 1000  amoxicillin-clavulanate  (AUGMENTIN) 875-125 MG per tablet 1 tablet     1 tablet Oral Every 12 hours 08/23/19 0758     08/19/19 1545  piperacillin-tazobactam (ZOSYN) IVPB 3.375 g  Status:  Discontinued     3.375 g 12.5 mL/hr over 240 Minutes Intravenous Every 8 hours 08/19/19 1533 08/23/19 0758   08/13/19 2300  piperacillin-tazobactam (ZOSYN) IVPB 3.375 g  Status:  Discontinued     3.375 g 100 mL/hr over 30 Minutes Intravenous Every 8 hours 08/13/19 2149 08/19/19 1532   08/13/19 0930  piperacillin-tazobactam (ZOSYN) IVPB 3.375 g     3.375 g 100 mL/hr over 30 Minutes Intravenous  Once 08/13/19 9381 08/13/19 1638       Assessment/Plan Renal Lesion- Per [facility] IMV thrombosison prior CT- Not seen/mentioned on CT 5/31. Per [facility] GERD SevereMalnutrition -prealbumin12.0 (5/31), TPN Ulcerative Colitis- Diagnosed 07/2018,previously onPepcid,DailyIron, Lialda, oral prednisone daily. Sees Dr. Buford Dresser in [place] but was managed by Dr. Oneida Alar before she left the practice.Steroidsstopped5/27per GI. Plans to follow up with [facility] as an outpatient.  Left perirectal and retroperitoneal/LUQ fluid collections, suspect secondary to descending colonic perforation - S/p IR drain 5/27, continue drain. Cx with E. Coli. Drain per IR - Cont abx for 14 days total. Switch to oral abx today - CT 5/31 with improvement - No immediate surgical needs - Okay for discharge  IRX:JJYP, Boost ID: [id number] - 6/3 VTE: SCD's,Lovenox Foley: none Follow up:GI, CRS, IR  Plan: Okay for discharge from a surgical standpoint. Follow up in our office, GI, and IR. Will need to complete 14 days total of abx.      LOS: 10 days    Jillyn Ledger , [facility] 08/24/2019, 8:23 AM Please see Amion for pager number during day hours 7:00am-4:30pm

## 2019-08-27 ENCOUNTER — Other Ambulatory Visit: Payer: Self-pay | Admitting: Surgery

## 2019-08-27 DIAGNOSIS — K5720 Diverticulitis of large intestine with perforation and abscess without bleeding: Secondary | ICD-10-CM

## 2019-08-27 DIAGNOSIS — R198 Other specified symptoms and signs involving the digestive system and abdomen: Secondary | ICD-10-CM

## 2019-08-27 LAB — THIOPURINE METHYLTRANSFERASE (TPMT), RBC: TPMT Activity:: 14.9 Units/mL RBC

## 2019-09-12 ENCOUNTER — Ambulatory Visit
Admission: RE | Admit: 2019-09-12 | Discharge: 2019-09-12 | Disposition: A | Discharge status: Home / Self Care | Payer: 59 | Source: Ambulatory Visit | Attending: Physician Assistant | Admitting: Physician Assistant

## 2019-09-12 ENCOUNTER — Ambulatory Visit
Admission: RE | Admit: 2019-09-12 | Discharge: 2019-09-12 | Disposition: A | Discharge status: Home / Self Care | Payer: 59 | Source: Ambulatory Visit | Attending: Surgery | Admitting: Surgery

## 2019-09-12 ENCOUNTER — Other Ambulatory Visit: Payer: Self-pay | Admitting: Surgery

## 2019-09-12 ENCOUNTER — Encounter: Payer: Self-pay | Admitting: Radiology

## 2019-09-12 DIAGNOSIS — R198 Other specified symptoms and signs involving the digestive system and abdomen: Secondary | ICD-10-CM

## 2019-09-12 DIAGNOSIS — K5720 Diverticulitis of large intestine with perforation and abscess without bleeding: Secondary | ICD-10-CM

## 2019-09-12 HISTORY — PX: IR RADIOLOGIST EVAL & MGMT: IMG5224

## 2019-09-12 MED ORDER — IOPAMIDOL (ISOVUE-300) INJECTION 61%
100.0000 mL | Freq: Once | INTRAVENOUS | Status: AC | PRN
  Administered 2019-09-12: 100 mL via INTRAVENOUS

## 2019-09-12 NOTE — Progress Notes (Signed)
Referring Physician(s): Dr. Grandville Silos  Chief Complaint: The patient is seen in follow up today s/p perforated diverticulitis  History of present illness:  Brett Bright is a 59 y.o. male with a medical history of ulcerative colitis, upper GI bleed, portal vein thrombosis and GERD. He presented to the ED 08/14/19 with nausea, vomiting and diarrhea. CT Abdomen Pelvis showed gas and fluid-filled collections in the left abdomen.  He underwent image-guided left intra-abdominal drain placement 08/16/19 with Dr. Earleen Newport. Culture from fluid aspiration was positive for E coli. He was ultimately discharged home on PO antibiotics.  He returns to IR drain clinic today for follow-up.   Patient reports he continues to improve at home.  He denies pain, nausea, vomiting.  He has been able to advance his diet.  He has completed antibiotics.  He continues to flush his drain daily at home.  It does have a foul odor.    Past Medical History:  Diagnosis Date  . Ulcerative colitis with rectal bleeding ([place]) 07/2018    Past Surgical History:  Procedure Laterality Date  . APPENDECTOMY    . BIOPSY  08/16/2018   Procedure: BIOPSY;  Surgeon: Daneil Dolin, MD;  [facility];  Service: Endoscopy;;  . CHOLECYSTECTOMY    . COLONOSCOPY N/A 03/23/2016   Dr. Oneida Alar: Thrombosed external hemorrhoid, internal hemorrhoids, diverticulosis, segmental moderate inflammation sigmoid colon secondary due to colitis with benign biopsies.  Next colonoscopy 5 years due to poor prep right colon.  . COLONOSCOPY N/A 08/16/2018   Dr. Gala Romney: pan-ulcerative colitis, diverticulosis. Bx c/w UC  . ESOPHAGOGASTRODUODENOSCOPY N/A 03/23/2016   Dr. Oneida Alar: Gastritis, no H. pylori, multiple small sessile polyps biopsy consistent with fundic gland, mild duodenitis.  Marland Kitchen ESOPHAGOGASTRODUODENOSCOPY N/A 08/16/2018   Procedure: ESOPHAGOGASTRODUODENOSCOPY (EGD);  Surgeon: Daneil Dolin, MD;  [facility];  Service: Endoscopy;   Laterality: N/A;  . VASECTOMY      Allergies: Patient has no known allergies.  Medications: Prior to Admission medications   Medication Sig Start Date End Date Taking? Authorizing Provider  acetaminophen (TYLENOL) 325 MG tablet Take 325 mg by mouth every 6 (six) hours as needed for fever.    [provider]  amoxicillin-clavulanate (AUGMENTIN) 875-125 MG tablet Take 1 tablet by mouth every 12 (twelve) hours. 08/24/19   Maczis, Barth Kirks, PA-C  ferrous sulfate (FEROSUL) 325 (65 FE) MG tablet Take 1 tablet (325 mg total) by mouth daily with breakfast. 07/20/19   Irwin Brakeman L, MD  mesalamine (LIALDA) 1.2 g EC tablet Take 4 tablets (4.8 g total) by mouth daily with breakfast. 07/20/19   Johnson, Clanford L, MD  omeprazole (PRILOSEC) 20 MG capsule Take 20 mg by mouth daily.    [provider]  sodium chloride flush (NS) 0.9 % SOLN 5 mLs by Intracatheter route daily. 08/24/19   Maczis, Barth Kirks, PA-C     Family History  Problem Relation Age of Onset  . Stroke Mother   . Cancer Maternal Grandfather        Unknown type of cancer  . Heart attack Paternal Grandmother   . Cancer Maternal Aunt        Possibly lung cancer  . Cancer Paternal Uncle        Unknown type of cancer  . Cancer Paternal Uncle        Unknown type of cancer  . Colon cancer Neg Hx     Social History   Socioeconomic History  . Marital status: Married  Spouse name: Not on file  . Number of children: 3  . Years of education: Not on file  . Highest education level: Not on file  Occupational History  . Occupation: Primary school teacher    Comment: At [employer]   Tobacco Use  . Smoking status: Never Smoker  . Smokeless tobacco: Never Used  Vaping Use  . Vaping Use: Never used  Substance and Sexual Activity  . Alcohol use: No  . Drug use: No  . Sexual activity: Not on file  Other Topics Concern  . 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   He is married and has 3 children, who are ages 13, 63, and 23 as of 2017.   The 25 and 32 year old children are still living at home, but the 58 yo is married and lives elsewhere.   The patient lives in [place], [place].      His primary care is [facility] in [place], [place].   Social Determinants of Health   Financial Resource Strain:   . Difficulty of Paying Living Expenses:   Food Insecurity:   . Worried About Charity fundraiser in the Last Year:   . Arboriculturist in the Last Year:   Transportation Needs:   . Film/video editor (Medical):   Marland Kitchen Lack of Transportation (Non-Medical):   Physical Activity:   . Days of Exercise per Week:   . Minutes of Exercise per Session:   Stress:   . Feeling of Stress :   Social Connections:   . Frequency of Communication with Friends and Family:   . Frequency of Social Gatherings with Friends and Family:   . Attends Religious Services:   . Active Member of Clubs or Organizations:   . Attends Archivist Meetings:   Marland Kitchen Marital Status:      Vital Signs: There were no vitals taken for this visit.  Physical Exam   NAD, alert Abdomen: soft, non-tender.  LUQ drain in place near midline. Insertion site I/c/d.  Foul smelling, tan output.  Imaging: No results found.  Labs:  CBC: Recent Labs    08/15/19 0230 08/16/19 0203 08/17/19 0434 08/20/19 0409  WBC 4.2 5.1 6.9 4.5  HGB 10.0* 9.8* 10.4* 9.8*  HCT 31.9* 31.4* 33.2* 31.6*  PLT 238 269 311 276    COAGS: Recent Labs    08/16/19 0203 08/21/19 0401  INR 1.2 1.1    BMP: Recent Labs    08/19/19 0427 08/20/19 0409 08/21/19 0401 08/23/19 0436  NA 133* 134* 133* 134*  K 4.0 4.3 4.3 4.2  CL 103 101 101 104  CO2 25 26 25 25   GLUCOSE 131* 127* 134* 115*  BUN 7 8 8 13   CALCIUM 7.8* 8.1* 8.2* 8.4*  CREATININE 0.55* 0.56* 0.61 0.73  GFRNONAA >60 >60 >60 >60  GFRAA >60 >60 >60 >60    LIVER FUNCTION TESTS: Recent Labs     08/17/19 0434 08/20/19 0409 08/21/19 0401 08/23/19 0436  BILITOT 0.4 0.5 0.2* 0.1*  AST 25 97* 87* 39  ALT 47* 127* 145* 96*  ALKPHOS 48 46 50 57  PROT 5.7* 6.4* 6.6 7.1  ALBUMIN 1.6* 1.8* 1.9* 2.2*    Assessment: Intra-abdominal fluid collection related to perforated colon- diverticulitis vs. UC s/p drain placement 08/16/19 by Dr. Earleen Newport.  Patient presents to IR drain clinic for follow-up evaluation  and management of his drain.  He has been progressively feeling better at home.  Denies pain throughout his acute process.  Has a small output of foul-smelling output from drain daily.   CT Abdomen Pelvis and drain injection performed and reviewed by Dr. Kathlene Cote today.  Abscess has resolved, however drain injection shows the presence of fistulous connections to large vs. Small bowel.  Images saved to medical record.   Leave drain in place for now.  Switch to gravity drainage.  Stop flushes at home.   Plan to return for drain injection with IR evaluation in 2 weeks. Patient verbalizes understanding.  Signed: Docia Barrier, PA 09/12/2019, 12:46 PM   Please refer to Dr. Kathlene Cote attestation of this note for management and plan.

## 2019-09-19 ENCOUNTER — Other Ambulatory Visit: Payer: Self-pay | Admitting: Surgery

## 2019-09-19 DIAGNOSIS — N2889 Other specified disorders of kidney and ureter: Secondary | ICD-10-CM

## 2019-09-20 ENCOUNTER — Ambulatory Visit: Payer: 59 | Admitting: Physician Assistant

## 2019-09-20 ENCOUNTER — Encounter: Payer: Self-pay | Admitting: Physician Assistant

## 2019-09-20 ENCOUNTER — Other Ambulatory Visit: Payer: Self-pay | Admitting: *Deleted

## 2019-09-20 ENCOUNTER — Other Ambulatory Visit (INDEPENDENT_AMBULATORY_CARE_PROVIDER_SITE_OTHER): Payer: 59

## 2019-09-20 VITALS — BP 112/73 | HR 88 | Ht 69.0 in | Wt 192.0 lb

## 2019-09-20 DIAGNOSIS — D500 Iron deficiency anemia secondary to blood loss (chronic): Secondary | ICD-10-CM

## 2019-09-20 DIAGNOSIS — K51919 Ulcerative colitis, unspecified with unspecified complications: Secondary | ICD-10-CM

## 2019-09-20 DIAGNOSIS — D509 Iron deficiency anemia, unspecified: Secondary | ICD-10-CM

## 2019-09-20 DIAGNOSIS — K5720 Diverticulitis of large intestine with perforation and abscess without bleeding: Secondary | ICD-10-CM | POA: Diagnosis not present

## 2019-09-20 LAB — IBC + FERRITIN
Ferritin: 131.3 ng/mL (ref 22.0–322.0)
Iron: 65 ug/dL (ref 42–165)
Saturation Ratios: 26.2 % (ref 20.0–50.0)
Transferrin: 177 mg/dL — ABNORMAL LOW (ref 212.0–360.0)

## 2019-09-20 LAB — CBC WITH DIFFERENTIAL/PLATELET
Basophils Absolute: 0.1 10*3/uL (ref 0.0–0.1)
Basophils Relative: 1.3 % (ref 0.0–3.0)
Eosinophils Absolute: 0.2 10*3/uL (ref 0.0–0.7)
Eosinophils Relative: 1.9 % (ref 0.0–5.0)
HCT: 36.1 % — ABNORMAL LOW (ref 39.0–52.0)
Hemoglobin: 12.1 g/dL — ABNORMAL LOW (ref 13.0–17.0)
Lymphocytes Relative: 10.2 % — ABNORMAL LOW (ref 12.0–46.0)
Lymphs Abs: 0.8 10*3/uL (ref 0.7–4.0)
MCHC: 33.4 g/dL (ref 30.0–36.0)
MCV: 83 fl (ref 78.0–100.0)
Monocytes Absolute: 0.7 10*3/uL (ref 0.1–1.0)
Monocytes Relative: 8.7 % (ref 3.0–12.0)
Neutro Abs: 6.4 10*3/uL (ref 1.4–7.7)
Neutrophils Relative %: 77.9 % — ABNORMAL HIGH (ref 43.0–77.0)
Platelets: 265 10*3/uL (ref 150.0–400.0)
RBC: 4.35 Mil/uL (ref 4.22–5.81)
RDW: 15.3 % (ref 11.5–15.5)
WBC: 8.2 10*3/uL (ref 4.0–10.5)

## 2019-09-20 NOTE — Progress Notes (Signed)
Chief Complaint: Follow-up hospitalization for perforated diverticulitis and UC  HPI:    Brett Bright is a 59 year old Caucasian male with a past medical history as listed below including ulcerative colitis and recent hospitalization for perforated diverticulitis with abscess status post drain placement, assigned to Dr. Rush Landmark, who presents to clinic today for follow-up.    08/16/2018 colonoscopy with Dr. Gala Romney with pan ulcerative proctocolitis.    08/15/2019-08/24/2019 admitted to the hospital for ulcerative colitis and a perforated colon with intra-abdominal abscess.  He had a drain placed for the fluid collection left lower quadrant during hospitalization, CT 5/31 showed significant reduction in the size of the abscess following drain placement with persistent but improved fat stranding, phlegmon in the left small bowel mesenteric.  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  It was thought all of this was from diverticulitis and not the patient's UC.    09/12/2019 CT of the abdomen pelvis with contrast showed decreased air and inflammation associated with a complex fistula which appeared to communicate with the sigmoid colon and rectum as well as up to near the level of the left peritoneal drainage catheter, no further abscess identified.    09/12/2019 DG sinus/fistula tube check with percutaneous peritoneal drainage catheter injection demonstrating initial fistula to the colon at the juncture of the lower descending and proximal sigmoid colon followed by additional fistula to the small bowel immediately posterior to the drain.    Today, the patient tells me he is feeling well.  He is still taking his iron but he has changed this to every other day after finding of iron deficiency anemia in the hospital.  Tells me he is currently taking his Lialda 4 tablets once a day as well.  He continues to deny any abdominal pain reminds  me that "I never had any".  Describes the drainage from his abscess has slowed significantly since being in the hospital.  Overall the patient is feeling well and slowly gaining back his strength.    Planning on a family vacation at the end of July.    Denies fever, chills, weight loss, blood in his stool, nausea, vomiting or abdominal pain.  Past Medical History:  Diagnosis Date  . Ulcerative colitis with rectal bleeding ([place]) 07/2018    Past Surgical History:  Procedure Laterality Date  . APPENDECTOMY    . BIOPSY  08/16/2018   Procedure: BIOPSY;  Surgeon: Daneil Dolin, MD;  [facility];  Service: Endoscopy;;  . CHOLECYSTECTOMY    . COLONOSCOPY N/A 03/23/2016   Dr. Oneida Alar: Thrombosed external hemorrhoid, internal hemorrhoids, diverticulosis, segmental moderate inflammation sigmoid colon secondary due to colitis with benign biopsies.  Next colonoscopy 5 years due to poor prep right colon.  . COLONOSCOPY N/A 08/16/2018   Dr. Gala Romney: pan-ulcerative colitis, diverticulosis. Bx c/w UC  . ESOPHAGOGASTRODUODENOSCOPY N/A 03/23/2016   Dr. Oneida Alar: Gastritis, no H. pylori, multiple small sessile polyps biopsy consistent with fundic gland, mild duodenitis.  Marland Kitchen ESOPHAGOGASTRODUODENOSCOPY N/A 08/16/2018   Procedure: ESOPHAGOGASTRODUODENOSCOPY (EGD);  Surgeon: Daneil Dolin, MD;  [facility];  Service: Endoscopy;  Laterality: N/A;  . IR RADIOLOGIST EVAL & MGMT  09/12/2019  . VASECTOMY      Current Outpatient Medications  Medication Sig Dispense Refill  . acetaminophen (TYLENOL) 325 MG tablet Take 325 mg by mouth every 6 (six) hours as needed for fever.    Marland Kitchen amoxicillin-clavulanate (AUGMENTIN) 875-125 MG tablet Take 1 tablet  by mouth every 12 (twelve) hours. 8 tablet 0  . ferrous sulfate (FEROSUL) 325 (65 FE) MG tablet Take 1 tablet (325 mg total) by mouth daily with breakfast. 60 tablet 3  . mesalamine (LIALDA) 1.2 g EC tablet Take 4 tablets (4.8 g total) by mouth daily with  breakfast. 120 tablet 11  . omeprazole (PRILOSEC) 20 MG capsule Take 20 mg by mouth daily.    . sodium chloride flush (NS) 0.9 % SOLN 5 mLs by Intracatheter route daily. 50 mL 1   No current facility-administered medications for this visit.    Allergies as of 09/20/2019  . (No Known Allergies)    Family History  Problem Relation Age of Onset  . Stroke Mother   . Cancer Maternal Grandfather        Unknown type of cancer  . Heart attack Paternal Grandmother   . Cancer Maternal Aunt        Possibly lung cancer  . Cancer Paternal Uncle        Unknown type of cancer  . Cancer Paternal Uncle        Unknown type of cancer  . Colon cancer Neg Hx     Social History   Socioeconomic History  . Marital status: Married    Spouse name: Not on file  . Number of children: 3  . Years of education: Not on file  . Highest education level: Not on file  Occupational History  . Occupation: Primary school teacher    Comment: At [employer]   Tobacco Use  . Smoking status: Never Smoker  . Smokeless tobacco: Never Used  Vaping Use  . Vaping Use: Never used  Substance and Sexual Activity  . Alcohol use: No  . Drug use: No  . Sexual activity: Not on file  Other Topics Concern  . 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   He is married and has 3 children, who are ages 26, 79, and 78 as of 2017.   The 15 and 45 year old children are still living at home, but the 58 yo is married and lives elsewhere.   The patient lives in [place], [place].      His primary care is [facility] in [place], [place].   Social Determinants of Health   Financial Resource Strain:   . Difficulty of Paying Living Expenses:   Food Insecurity:   . Worried About Charity fundraiser in the Last Year:   . Arboriculturist in the Last Year:   Transportation Needs:   . Film/video editor (Medical):   Marland Kitchen Lack of Transportation (Non-Medical):    Physical Activity:   . Days of Exercise per Week:   . Minutes of Exercise per Session:   Stress:   . Feeling of Stress :   Social Connections:   . Frequency of Communication with Friends and Family:   . Frequency of Social Gatherings with Friends and Family:   . Attends Religious Services:   . Active Member of Clubs or Organizations:   . Attends Archivist Meetings:   Marland Kitchen Marital Status:   Intimate Partner Violence:   . Fear of Current or Ex-Partner:   . Emotionally Abused:   Marland Kitchen Physically Abused:   . Sexually Abused:     Review of Systems:    Constitutional: No weight loss, fever or chills Cardiovascular: No  chest pain Respiratory: No SOB Gastrointestinal: See HPI and otherwise negative   Physical Exam:  Vital signs: BP 112/73   Pulse 88   Ht 5' 9"  (1.753 m)   Wt 192 lb (87.1 kg)   BMI 28.35 kg/m   Constitutional:   Pleasant Caucasian male appears to be in NAD, Well developed, Well nourished, alert and cooperative Respiratory: Respirations even and unlabored. Lungs clear to auscultation bilaterally.   No wheezes, crackles, or rhonchi.  Cardiovascular: Normal S1, S2. No MRG. Regular rate and rhythm. No peripheral edema, cyanosis or pallor.  Gastrointestinal:  Soft, nondistended, nontender. No rebound or guarding. Normal bowel sounds. No appreciable masses or hepatomegaly.+perc drain in place LLQ, clean bandaging, minimal clear/brownish fluid Rectal:  Not performed.  Psychiatric: Demonstrates good judgement and reason without abnormal affect or behaviors.  RELEVANT LABS AND IMAGING: CBC    Component Value Date/Time   WBC 4.5 08/20/2019 0409   RBC 3.66 (L) 08/20/2019 0409   HGB 9.8 (L) 08/20/2019 0409   HCT 31.6 (L) 08/20/2019 0409   HCT 25.8 (L) 03/22/2016 2207   PLT 276 08/20/2019 0409   MCV 86.3 08/20/2019 0409   MCH 26.8 08/20/2019 0409   MCHC 31.0 08/20/2019 0409   RDW 13.3 08/20/2019 0409   LYMPHSABS 0.6 (L) 08/20/2019 0409   MONOABS 0.5 08/20/2019  0409   EOSABS 0.2 08/20/2019 0409   BASOSABS 0.0 08/20/2019 0409    CMP     Component Value Date/Time   NA 134 (L) 08/23/2019 0436   K 4.2 08/23/2019 0436   CL 104 08/23/2019 0436   CO2 25 08/23/2019 0436   GLUCOSE 115 (H) 08/23/2019 0436   BUN 13 08/23/2019 0436   CREATININE 0.73 08/23/2019 0436   CREATININE 0.88 12/19/2018 1547   CALCIUM 8.4 (L) 08/23/2019 0436   PROT 7.1 08/23/2019 0436   ALBUMIN 2.2 (L) 08/23/2019 0436   AST 39 08/23/2019 0436   ALT 96 (H) 08/23/2019 0436   ALKPHOS 57 08/23/2019 0436   BILITOT 0.1 (L) 08/23/2019 0436   GFRNONAA >60 08/23/2019 0436   GFRAA >60 08/23/2019 0436    Assessment: 1.  Colonic perforation with abscess formation and fistula: Thought related to diverticulitis, status post drain placement in the hospital, abscess is decreasing, fistulas still present 2.  IDA: Found in the hospital, last hemoglobin 5/31 was 9.8, has been on iron supplementation 3.  UC: Currently on Lialda 4.8 g daily, last colonoscopy in May 2020, question about whether or not recent episode of perforation was related  Plan: 1.  It is recommended that the patient have a colonoscopy for further evaluation to ensure that his ulcerative colitis is not playing a role in his recent episode of perforation.  Timing will be per Dr. Rush Landmark.  We will allow him to review recent CT.  MRI has also been ordered by surgical team.  Did discuss risks, benefits, limitations and alternatives with the patient and he agrees to proceed when necessary. 2.  Recommend he continue his Lialda 4.8 g daily 3.  We will recheck his iron today including a CBC, iron studies and ferritin.  Currently he is taking Ferrous sulfate 325 mg every other day. 4.  Patient to follow in clinic per recommendations from Dr. Rush Landmark after time of procedure.  Ellouise Newer, PA-C [facility] 09/20/2019, 9:42 AM  Cc: No ref. provider found

## 2019-09-20 NOTE — Patient Instructions (Signed)
If you are age 58 or older, your body mass index should be between 23-30. Your Body mass index is 28.35 kg/m. If this is out of the aforementioned range listed, please consider follow up with your Primary Care Provider.  If you are age 89 or younger, your body mass index should be between 19-25. Your Body mass index is 28.35 kg/m. If this is out of the aformentioned range listed, please consider follow up with your Primary Care Provider.   Your provider has requested that you go to the basement level for lab work before leaving today. Press "B" on the elevator. The lab is located at the first door on the left as you exit the elevator.  Will call back to schedule colonoscopy after talking to Dr. Rush Landmark.

## 2019-09-20 NOTE — Progress Notes (Signed)
b

## 2019-09-27 ENCOUNTER — Other Ambulatory Visit: Payer: Self-pay

## 2019-09-27 ENCOUNTER — Encounter: Payer: Self-pay | Admitting: Radiology

## 2019-09-27 ENCOUNTER — Ambulatory Visit
Admission: RE | Admit: 2019-09-27 | Discharge: 2019-09-27 | Disposition: A | Discharge status: Home / Self Care | Payer: 59 | Source: Ambulatory Visit | Attending: Surgery | Admitting: Surgery

## 2019-09-27 DIAGNOSIS — R198 Other specified symptoms and signs involving the digestive system and abdomen: Secondary | ICD-10-CM

## 2019-09-27 DIAGNOSIS — K5720 Diverticulitis of large intestine with perforation and abscess without bleeding: Secondary | ICD-10-CM

## 2019-09-27 HISTORY — PX: IR RADIOLOGIST EVAL & MGMT: IMG5224

## 2019-09-27 NOTE — Progress Notes (Signed)
Chief Complaint: Patient was seen in consultation today for follow up of LLQ diverticular abscess drain at the request of [place]  Referring Physician(s): [place]  Supervising Physician: Sandi Mariscal  History of Present Illness: Brett Bright is a 59 y.o. male with past medical history significant for GERD, previous upper GI bleed, portal vein thrombosis and ulcerative colitis with previous perforation and abscess formation s/p LLQ abscess drain placement 08/16/19 by Dr. Earleen Newport who presents today for repeat drain injection. Brett Bright initially presented to the ED on 08/14/19 with complaints of n/v/d and was found to have a LLQ abscess. Brett Bright underwent drain placement on 5/25 and aspirate was (+) for e.coli. Brett Bright was discharged on 08/24/19 on PO antibiotics. 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 His drain was changed to gravity and flushes were discontinued at that time. Brett Bright was instructed to follow up with Dr. Johney Maine and to return to IR clinic in 2-3 weeks for repeat drain injection for which Brett Bright presents today.  Brett Bright states Brett Bright has been feeling well and has no complaints except that the drain can get in the way sometimes while Brett Bright's at work. Brett Bright is not flushing the drain as was instructed at his previous visit - Brett Bright empties the bag once a day and has a very tiny amount of clear yellow output. Brett Bright denies any abdominal pain, n/v/d, fevers or chills. Brett Bright is going to have a colonoscopy with GI soon but is not sure when Brett Bright is too see Dr. Johney Maine again. Brett Bright tells me an MRI of his abdomen was ordered by Dr. Johney Maine but Brett Bright is not sure why Brett Bright needs this. Brett Bright is looking forward to a trip out [place] with his family coming up and is hopeful Brett Bright will be able to swim with his 32 year old daughter soon.  Past Medical History:  Diagnosis Date   Ulcerative colitis  with rectal bleeding ([place]) 07/2018    Past Surgical History:  Procedure Laterality Date   APPENDECTOMY     BIOPSY  08/16/2018   Procedure: BIOPSY;  Surgeon: Daneil Dolin, MD;  Location: [facility];  Service: Endoscopy;;   CHOLECYSTECTOMY     COLONOSCOPY N/A 03/23/2016   Dr. Oneida Alar: Thrombosed external hemorrhoid, internal hemorrhoids, diverticulosis, segmental moderate inflammation sigmoid colon secondary due to colitis with benign biopsies.  Next colonoscopy 5 years due to poor prep right colon.   COLONOSCOPY N/A 08/16/2018   Dr. Gala Romney: pan-ulcerative colitis, diverticulosis. Bx c/w UC   ESOPHAGOGASTRODUODENOSCOPY N/A 03/23/2016   Dr. Oneida Alar: Gastritis, no H. pylori, multiple small sessile polyps biopsy consistent with fundic gland, mild duodenitis.   ESOPHAGOGASTRODUODENOSCOPY N/A 08/16/2018   Procedure: ESOPHAGOGASTRODUODENOSCOPY (EGD);  Surgeon: Daneil Dolin, MD;  Location: [facility];  Service: Endoscopy;  Laterality: N/A;   IR RADIOLOGIST EVAL & MGMT  09/12/2019   VASECTOMY      Allergies: Patient has no known allergies.  Medications: Prior to Admission medications   Medication Sig Start Date End Date Taking? Authorizing Provider  acetaminophen (TYLENOL) 325 MG tablet Take 325 mg by mouth every 6 (six) hours as needed for fever.    [provider]  amoxicillin-clavulanate (AUGMENTIN) 875-125 MG tablet Take 1 tablet by mouth every 12 (twelve) hours. 08/24/19   Maczis, Barth Kirks, PA-C  ferrous sulfate (FEROSUL) 325 (65 FE) MG tablet Take 1  tablet (325 mg total) by mouth daily with breakfast. 07/20/19   Irwin Brakeman L, MD  mesalamine (LIALDA) 1.2 g EC tablet Take 4 tablets (4.8 g total) by mouth daily with breakfast. 07/20/19   Johnson, Clanford L, MD  omeprazole (PRILOSEC) 20 MG capsule Take 20 mg by mouth daily.    [provider]  sodium chloride flush (NS) 0.9 % SOLN 5 mLs by Intracatheter route daily. 08/24/19   Maczis, Barth Kirks, PA-C      Family History  Problem Relation Age of Onset   Stroke Mother    Cancer Maternal Grandfather        Unknown type of cancer   Heart attack Paternal Grandmother    Cancer Maternal Aunt        Possibly lung cancer   Cancer Paternal Uncle        Unknown type of cancer   Cancer Paternal Uncle        Unknown type of cancer   Colon cancer Neg Hx     Social History   Socioeconomic History   Marital status: Married    Spouse name: Not on file   Number of children: 3   Years of education: Not on file   Highest education level: Not on file  Occupational History   Occupation: Primary school teacher    Comment: At [employer]   Tobacco Use   Smoking status: Never Smoker   Smokeless tobacco: Never Used  Vaping Use   Vaping Use: Never used  Substance and Sexual Activity   Alcohol use: No   Drug use: No   Sexual activity: Not on file  Other Topics Concern   Not on file  Social History Narrative   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   Brett Bright is married and has 3 children, who are ages 77, 31, and 20 as of 2017.   The 27 and 45 year old children are still living at home, but the 58 yo is married and lives elsewhere.   The patient lives in [place], [place].      His primary care is [facility] in [place], [place].   Social Determinants of Health   Financial Resource Strain:    Difficulty of Paying Living Expenses:   Food Insecurity:    Worried About Charity fundraiser in the Last Year:    Arboriculturist in the Last Year:   Transportation Needs:    Film/video editor (Medical):    Lack of Transportation (Non-Medical):   Physical Activity:    Days of Exercise per Week:    Minutes of Exercise per Session:   Stress:    Feeling of Stress :   Social Connections:    Frequency of Communication with Friends and Family:    Frequency of Social Gatherings with Friends and Family:    Attends Religious Services:     Active Member of Clubs or Organizations:    Attends Music therapist:    Marital Status:     ECOG Status: 0 - Asymptomatic  Review of Systems: A 12 point ROS discussed and pertinent positives are indicated in the HPI above.  All other systems are negative.  Review of Systems  Constitutional: Negative for appetite change, chills and fever.  Respiratory: Negative for shortness of breath.   Cardiovascular: Negative for chest pain.  Gastrointestinal: Negative for abdominal pain, blood in stool, diarrhea, nausea and vomiting.  Vital Signs: There were no vitals taken for this visit.  Physical Exam Constitutional:      General: Brett Bright is not in acute distress. HENT:     Head: Normocephalic.  Pulmonary:     Effort: Pulmonary effort is normal.  Abdominal:     General: There is no distension.     Palpations: Abdomen is soft.     Tenderness: There is no abdominal tenderness.     Comments: (+) LLQ drain to gravity with very scant serous appearing output. Some erythema and crusting around drain insertion site but no purulent material/pain concerning for infectious process. Drain injection with ~10 cc of contrast shows almost immediate return of contrast from insertion site. No pain with injection just feeling of fullness per patient.  Skin:    General: Skin is warm and dry.     Coloration: Skin is not jaundiced.  Neurological:     Mental Status: Brett Bright is alert and oriented to person, place, and time.  Psychiatric:        Mood and Affect: Mood normal.        Behavior: Behavior normal.        Thought Content: Thought content normal.        Judgment: Judgment normal.      Imaging: CT ABDOMEN PELVIS W CONTRAST  Result Date: 09/12/2019 CLINICAL DATA:  Status post percutaneous catheter drainage of peritoneal abscess on 08/16/2019. History of ulcerative colitis. EXAM: CT ABDOMEN AND PELVIS WITH CONTRAST TECHNIQUE: Multidetector CT imaging of the abdomen and pelvis was  performed using the standard protocol following bolus administration of intravenous contrast. CONTRAST:  163m ISOVUE-300 IOPAMIDOL (ISOVUE-300) INJECTION 61% COMPARISON:  08/20/2019 FINDINGS: Lower chest: No acute abnormality. Hepatobiliary: No focal liver abnormality is seen. Status post cholecystectomy. No biliary dilatation. Pancreas: Unremarkable. No pancreatic ductal dilatation or surrounding inflammatory changes. Spleen: Normal in size without focal abnormality. Adrenals/Urinary Tract: Stable bilateral renal cysts as well as smaller hyperdense cortical lesions of the right kidney which may represent hemorrhagic cysts. The bladder is decompressed. Stomach/Bowel: Decreased air and inflammation associated with complex fistula which appears to communicate with the sigmoid colon and rectum as well as S and up to near the level of the left peritoneal did drain. No further abscess identified at the level of the peritoneal drainage catheter. No free air, ileus or bowel obstruction. Vascular/Lymphatic: No significant vascular findings are present. No enlarged abdominal or pelvic lymph nodes. Reproductive: Prostate is unremarkable. Other: No abdominal wall hernia or abnormality. No abdominopelvic ascites. Musculoskeletal: No acute or significant osseous findings. IMPRESSION: Decreased air and inflammation associated with complex fistula which appears to communicate with the sigmoid colon and rectum as well as up to near the level of the left peritoneal drainage catheter. No further abscess identified at the level of the peritoneal drainage catheter. Electronically Signed   By: GAletta EdouardM.D.   On: 09/12/2019 16:55   DG Sinus/Fist Tube Chk-Non GI  Result Date: 09/12/2019 INDICATION: Ulcerative colitis and status post percutaneous catheter drainage of peritoneal abscess on 08/16/2019 EXAM: INJECTION Of PERCUTANEOUS DRAINAGE CATHETER UNDER FLUOROSCOPY MEDICATIONS: None ANESTHESIA/SEDATION: None COMPLICATIONS:  None immediate. CONTRAST:  10 mL Omnipaque 300 FLUOROSCOPY TIME:  48 seconds.  86 mGy. PROCEDURE: Contrast was injected via the pre-existing left-sided percutaneous drain. Multiple fluoroscopic images were saved. The drainage catheter was then gently flushed with sterile saline and connected to a gravity drainage bag. FINDINGS: With injection of the percutaneous drain, there is initial filling of a descending thin fistula  that communicates with the colonic lumen at the juncture of the lower descending colon and proximal sigmoid colon. With further contrast injection, there than is opacification of small bowel immediately posterior to the drain. IMPRESSION: Percutaneous peritoneal drainage catheter injection demonstrates initial fistula to the colon at the juncture of the lower descending and proximal sigmoid colon followed by additional fistula to the small bowel immediately posterior to the drain. Electronically Signed   By: Aletta Edouard M.D.   On: 09/12/2019 16:47   IR Radiologist Eval & Mgmt  Result Date: 09/12/2019 Please refer to notes tab for details about interventional procedure. (Op Note)   Labs:  CBC: Recent Labs    08/16/19 0203 08/17/19 0434 08/20/19 0409 09/20/19 1020  WBC 5.1 6.9 4.5 8.2  HGB 9.8* 10.4* 9.8* 12.1*  HCT 31.4* 33.2* 31.6* 36.1*  PLT 269 311 276 265.0    COAGS: Recent Labs    08/16/19 0203 08/21/19 0401  INR 1.2 1.1    BMP: Recent Labs    08/19/19 0427 08/20/19 0409 08/21/19 0401 08/23/19 0436  NA 133* 134* 133* 134*  K 4.0 4.3 4.3 4.2  CL 103 101 101 104  CO2 25 26 25 25   GLUCOSE 131* 127* 134* 115*  BUN 7 8 8 13   CALCIUM 7.8* 8.1* 8.2* 8.4*  CREATININE 0.55* 0.56* 0.61 0.73  GFRNONAA >60 >60 >60 >60  GFRAA >60 >60 >60 >60    LIVER FUNCTION TESTS: Recent Labs    08/17/19 0434 08/20/19 0409 08/21/19 0401 08/23/19 0436  BILITOT 0.4 0.5 0.2* 0.1*  AST 25 97* 87* 39  ALT 47* 127* 145* 96*  ALKPHOS 48 46 50 57  PROT 5.7* 6.4* 6.6  7.1  ALBUMIN 1.6* 1.8* 1.9* 2.2*    TUMOR MARKERS: No results for input(s): AFPTM, CEA, CA199, CHROMGRNA in the last 8760 hours.  Assessment:  59 y/o M with history of ulcerative colitis with perforation and abscess formation s/p LLQ drain placement 08/16/19 in IR. 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 Brett Bright returns today for repeat drain injection.  Patient reports that Brett Bright has not been flushing the drain as was instructed at last visit, output has been very minimal - less than <1 cc QD of serous appearing output. Brett Bright denies any concerning s/s today. His last visit with Dr. Johney Maine was prior to his previous IR clinic follow up and Brett Bright has no follows with their service planned that Brett Bright is aware of - Brett Bright is asking about the reasoning for an MRI abdomen that was ordered by Dr. Johney Maine. Brett Bright has plans for a colonoscopy soon with GI.  Drain injection shows no residual abscess with immediate return of contrast material through the insertion site. Patient history and imaging was reviewed by Dr. Pascal Lux today who recommends drain removal which patient is agreeable to. Drain and suture were removed in tact without complication today. Wound care instructions given to which patient states understanding.  Plan: - Keep insertion site clean, dry and dressed x 48H; may shower after 48H but refrain from submerging for 7 days if possible - Contact general surgery office to discuss MRI abdomen which is ordered for 7/18. Brett Bright states that Brett Bright has their contact information. - Maintain colonoscopy appointment with GI - No need for further IR follow up at this time  Patient was encouraged to call with any further questions  or concerns.  Electronically Signed: Joaquim Nam PA-C 09/27/2019, 1:52 PM   Please refer to Dr. Pascal Lux' attestation of this note for management and plan.

## 2019-09-29 NOTE — Progress Notes (Signed)
Attending Physician's Attestation   I have reviewed the chart.   I agree with the Advanced Practitioner's note, impression, and recommendations with any updates as below.  Would proceed with repeat CT scan in next 2 to 3 weeks. Colonoscopy reasonable likely in 4 to 6 weeks. We will see how his laboratories involved in regards to his iron deficiency. I believe, this is more likely to have been diverticular perforation rather than UC related but certainly with his history we need to keep that in mind.  Ulcerative colitis is very rare to have overt perforation without toxic megacolon.  Consideration of Crohn's disease remains possible though certainly endoscopic evaluation will hopefully help [place] better understand things.  Justice Britain, MD [facility] Advanced Endoscopy Office # [phone]

## 2019-10-01 ENCOUNTER — Telehealth: Payer: Self-pay

## 2019-10-01 NOTE — Telephone Encounter (Signed)
-----   Message from Irving Copas., MD sent at 09/29/2019  2:37 PM EDT ----- Regarding: RE: Colon timing JL L,Thank you for the update.I would go ahead and get him set up for a repeat CT scan in the next 1 to 2 weeks.Okay to move forward with colonoscopy in the [place] in 4 to 6 weeks.Thanks.GM ----- Message ----- From: Levin Erp, PA Sent: 09/20/2019  10:44 AM EDT To: Irving Copas., MD Subject: Colon timing                                   Not sure when you wanted to schedule colon-had recent CT you can see in chart- I told him maybe another 3-4 weeks? Told him I would ask you and we will arrange.  Thanks- JLL

## 2019-10-02 NOTE — Telephone Encounter (Signed)
The pt has been scheduled for MRi abdomen on 7/18.  Does he still need Ct scan?

## 2019-10-02 NOTE — Telephone Encounter (Signed)
Left message on machine to call back  

## 2019-10-03 NOTE — Telephone Encounter (Signed)
MRI-Abdomen ordered by Dr. Johney Maine. Can hold on CT scan until that is finalized. Thanks. GM

## 2019-10-03 NOTE — Telephone Encounter (Signed)
Left message on machine to call back  

## 2019-10-03 NOTE — Telephone Encounter (Signed)
colonoscopy in the [place] in 4 to 6 weeks

## 2019-10-04 ENCOUNTER — Other Ambulatory Visit: Payer: Self-pay

## 2019-10-04 DIAGNOSIS — Z1159 Encounter for screening for other viral diseases: Secondary | ICD-10-CM

## 2019-10-04 NOTE — Telephone Encounter (Signed)
Colon scheduled for 8/24 with Dr Rush Landmark in the [facility].  Previsit and COVID testing scheduled.  The pt has been advised and instructed.

## 2019-10-07 ENCOUNTER — Ambulatory Visit
Admission: RE | Admit: 2019-10-07 | Discharge: 2019-10-07 | Disposition: A | Discharge status: Home / Self Care | Payer: 59 | Source: Ambulatory Visit | Attending: Surgery | Admitting: Surgery

## 2019-10-07 DIAGNOSIS — N2889 Other specified disorders of kidney and ureter: Secondary | ICD-10-CM

## 2019-10-07 MED ORDER — GADOBENATE DIMEGLUMINE 529 MG/ML IV SOLN
18.0000 mL | Freq: Once | INTRAVENOUS | Status: AC | PRN
  Administered 2019-10-07: 18 mL via INTRAVENOUS

## 2019-10-15 ENCOUNTER — Ambulatory Visit (AMBULATORY_SURGERY_CENTER): Payer: Self-pay | Admitting: *Deleted

## 2019-10-15 ENCOUNTER — Other Ambulatory Visit: Payer: Self-pay

## 2019-10-15 VITALS — Ht 69.0 in | Wt 187.0 lb

## 2019-10-15 DIAGNOSIS — K51919 Ulcerative colitis, unspecified with unspecified complications: Secondary | ICD-10-CM

## 2019-10-15 DIAGNOSIS — Z01818 Encounter for other preprocedural examination: Secondary | ICD-10-CM

## 2019-10-15 DIAGNOSIS — K5720 Diverticulitis of large intestine with perforation and abscess without bleeding: Secondary | ICD-10-CM

## 2019-10-15 NOTE — Progress Notes (Signed)
No egg or soy allergy known to patient  No issues with past sedation with any surgeries or procedures no intubation problems in the past  No diet pills per patient No home 02 use per patient  No blood thinners per patient  Pt denies issues with constipation  No A fib or A flutter  EMMI video to pt or MyChart  COVID 19 guidelines implemented in [facility] today   Due to the COVID-19 pandemic we are asking patients to follow these guidelines. Please only bring one care partner. Please be aware that your care partner may wait in the car in the parking lot or if they feel like they will be too hot to wait in the car, they may wait in the lobby on the 4th floor. All care partners are required to wear a mask the entire time (we do not have any that we can provide them), they need to practice social distancing, and we will do a Covid check for all patient's and care partners when you arrive. Also we will check their temperature and your temperature. If the care partner waits in their car they need to stay in the parking lot the entire time and we will call them on their cell phone when the patient is ready for discharge so they can bring the car to the front of the building. Also all patient's will need to wear a mask into building.

## 2019-10-18 ENCOUNTER — Encounter: Payer: Self-pay | Admitting: Gastroenterology

## 2019-11-07 ENCOUNTER — Ambulatory Visit: Payer: 59 | Admitting: Nurse Practitioner

## 2019-11-09 ENCOUNTER — Other Ambulatory Visit: Payer: Self-pay | Admitting: Gastroenterology

## 2019-11-09 ENCOUNTER — Other Ambulatory Visit: Payer: Self-pay

## 2019-11-09 ENCOUNTER — Ambulatory Visit (INDEPENDENT_AMBULATORY_CARE_PROVIDER_SITE_OTHER): Payer: 59

## 2019-11-09 DIAGNOSIS — Z1159 Encounter for screening for other viral diseases: Secondary | ICD-10-CM

## 2019-11-10 LAB — SARS CORONAVIRUS 2 (TAT 6-24 HRS): SARS Coronavirus 2: NEGATIVE

## 2019-11-13 ENCOUNTER — Ambulatory Visit (AMBULATORY_SURGERY_CENTER): Payer: 59 | Admitting: Gastroenterology

## 2019-11-13 ENCOUNTER — Other Ambulatory Visit: Payer: Self-pay

## 2019-11-13 ENCOUNTER — Encounter: Payer: Self-pay | Admitting: Gastroenterology

## 2019-11-13 VITALS — BP 109/64 | HR 82 | Temp 98.2°F | Resp 13 | Ht 69.0 in | Wt 187.0 lb

## 2019-11-13 DIAGNOSIS — K5140 Inflammatory polyps of colon without complications: Secondary | ICD-10-CM

## 2019-11-13 DIAGNOSIS — K51419 Inflammatory polyps of colon with unspecified complications: Secondary | ICD-10-CM

## 2019-11-13 DIAGNOSIS — K5720 Diverticulitis of large intestine with perforation and abscess without bleeding: Secondary | ICD-10-CM

## 2019-11-13 DIAGNOSIS — K6389 Other specified diseases of intestine: Secondary | ICD-10-CM

## 2019-11-13 DIAGNOSIS — K641 Second degree hemorrhoids: Secondary | ICD-10-CM | POA: Diagnosis not present

## 2019-11-13 DIAGNOSIS — K51919 Ulcerative colitis, unspecified with unspecified complications: Secondary | ICD-10-CM | POA: Diagnosis not present

## 2019-11-13 MED ORDER — SODIUM CHLORIDE 0.9 % IV SOLN: 500.0000 mL | Freq: Once | INTRAVENOUS | Status: DC

## 2019-11-13 NOTE — Patient Instructions (Signed)
Please read handouts provided. Continue present medications. Low fiber diet. Await pathology results. Continue Mesalamine for now.      YOU HAD AN ENDOSCOPIC PROCEDURE TODAY AT [facility]:   Refer to the procedure report that was given to you for any specific questions about what was found during the examination.  If the procedure report does not answer your questions, please call your gastroenterologist to clarify.  If you requested that your care partner not be given the details of your procedure findings, then the procedure report has been included in a sealed envelope for you to review at your convenience later.  YOU SHOULD EXPECT: Some feelings of bloating in the abdomen. Passage of more gas than usual.  Walking can help get rid of the air that was put into your GI tract during the procedure and reduce the bloating. If you had a lower endoscopy (such as a colonoscopy or flexible sigmoidoscopy) you may notice spotting of blood in your stool or on the toilet paper. If you underwent a bowel prep for your procedure, you may not have a normal bowel movement for a few days.  Please Note:  You might notice some irritation and congestion in your nose or some drainage.  This is from the oxygen used during your procedure.  There is no need for concern and it should clear up in a day or so.  SYMPTOMS TO REPORT IMMEDIATELY:   Following lower endoscopy (colonoscopy or flexible sigmoidoscopy):  Excessive amounts of blood in the stool  Significant tenderness or worsening of abdominal pains  Swelling of the abdomen that is new, acute  Fever of 100F or higher   For urgent or emergent issues, a gastroenterologist can be reached at any hour by calling [phone]. Do not use MyChart messaging for urgent concerns.    DIET:  We do recommend a small meal at first, but then you may proceed to your regular diet.  Drink plenty of fluids but you should avoid alcoholic beverages for 24  hours.  ACTIVITY:  You should plan to take it easy for the rest of today and you should NOT DRIVE or use heavy machinery until tomorrow (because of the sedation medicines used during the test).    FOLLOW UP: Our staff will call the number listed on your records 48-72 hours following your procedure to check on you and address any questions or concerns that you may have regarding the information given to you following your procedure. If we do not reach you, we will leave a message.  We will attempt to reach you two times.  During this call, we will ask if you have developed any symptoms of COVID 19. If you develop any symptoms (ie: fever, flu-like symptoms, shortness of breath, cough etc.) before then, please call [phone].  If you test positive for Covid 19 in the 2 weeks post procedure, please call and report this information to [place].    If any biopsies were taken you will be contacted by phone or by letter within the next 1-3 weeks.  Please call us at [phone] if you have not heard about the biopsies in 3 weeks.    SIGNATURES/CONFIDENTIALITY: You and/or your care partner have signed paperwork which will be entered into your electronic medical record.  These signatures attest to the fact that that the information above on your After Visit Summary has been reviewed and is understood.  Full responsibility of the confidentiality of this discharge information lies with  you and/or your care-partner.

## 2019-11-13 NOTE — Progress Notes (Signed)
Pt's states no medical or surgical changes since previsit or office visit.  VS CW  Held iron > 5 days

## 2019-11-13 NOTE — Progress Notes (Signed)
To PACU, VSS. Report to [email or web address] 

## 2019-11-13 NOTE — Progress Notes (Signed)
Called to room to assist during endoscopic procedure.  Patient ID and intended procedure confirmed with present staff. Received instructions for my participation in the procedure from the performing physician.  

## 2019-11-13 NOTE — Op Note (Signed)
[place] Patient Name: Brett Bright Procedure Date: 11/13/2019 9:43 AM MRN: [id number] Endoscopist: Justice Britain , MD Age: 59 Referring MD:  Date of Birth: 1960/08/30 Gender: Male Account #: [id number] Procedure:                Colonoscopy Indications:              Follow-up of colonic perforation (never clearly                            defined it this was a result of diverticular                            perforation leading to hospitalization and                            intraabdominal abscesses vs perforation due to UC) Medicines:                Monitored Anesthesia Care Procedure:                Pre-Anesthesia Assessment:                           - Prior to the procedure, a History and Physical                            was performed, and patient medications and                            allergies were reviewed. The patient's tolerance of                            previous anesthesia was also reviewed. The risks                            and benefits of the procedure and the sedation                            options and risks were discussed with the patient.                            All questions were answered, and informed consent                            was obtained. Prior Anticoagulants: The patient has                            taken no previous anticoagulant or antiplatelet                            agents. ASA Grade Assessment: III - A patient with                            severe systemic disease. After reviewing the risks  and benefits, the patient was deemed in                            satisfactory condition to undergo the procedure.                           After obtaining informed consent, the colonoscope                            was passed under direct vision. Throughout the                            procedure, the patient's blood pressure, pulse, and                            oxygen saturations  were monitored continuously. The                            Colonoscope was introduced through the anus and                            advanced to the 10 cm into the ileum. The                            colonoscopy was performed without difficulty. The                            patient tolerated the procedure. The quality of the                            bowel preparation was adequate. The terminal ileum,                            ileocecal valve, appendiceal orifice, and rectum                            were photographed. Scope In: 10:07:18 AM Scope Out: 10:33:03 AM Scope Withdrawal Time: 0 hours 22 minutes 1 second  Total Procedure Duration: 0 hours 25 minutes 45 seconds  Findings:                 The digital rectal exam findings include                            hemorrhoids. Pertinent negatives include no                            palpable rectal lesions.                           The terminal ileum and ileocecal valve appeared                            normal. Biopsies were taken with a cold forceps for  histology.                           Inflammation was found in a continuous and                            circumferential pattern from the rectum to the                            sigmoid colon and as large patches surrounded by                            normal mucosa in the descending colon and in the                            transverse colon. This was graded as Mayo Score 1                            (mild, with erythema, decreased vascular pattern,                            mild friability). The ascending colon and cecum are                            normal appearing. Biopsies were taken with a cold                            forceps for histology from the right colon for                            histology purposes. Biopsies were taken with a cold                            forceps for histology from the left colon for                             histology purposes. Biopsies were taken with a cold                            forceps for histology from the rectosigmoid                            colon/rectum for histology purposes.                           Many semi-sessile polyps were found in the                            recto-sigmoid colon, sigmoid colon, descending                            colon, transverse colon and ascending colon. These  are most likely inflammatory polyps (majority of                            them). These polyps were 4 to 15 mm in size. Twelve                            of these polyps were removed with a cold snare.                            Resection and retrieval were complete.                           Many small and large-mouthed diverticula were found                            in the recto-sigmoid colon, sigmoid colon,                            descending colon, transverse colon, ascending colon                            and cecum.                           A benign-appearing, intrinsic mild narrowing with                            evidence of congestion/swelling/erythema measuring                            5 cm (in length) was found in the recto-sigmoid                            colon (20-25 cm) and was traversed with the adult                            colonoscope with gentle pressure. Diverticulosis                            was noted in this region. No overt diverticulitis.                           Non-bleeding non-thrombosed internal hemorrhoids                            were found during retroflexion, during perianal                            exam and during digital exam. The hemorrhoids were                            Grade II (internal hemorrhoids that prolapse but  reduce spontaneously). Complications:            No immediate complications. Estimated Blood Loss:     Estimated blood loss was minimal. Impression:                - Hemorrhoids found on digital rectal exam.                           - The examined portion of the ileum was normal.                            Biopsied.                           - Mild (Mayo Score 1) ulcerative colitis on the                            left colon and portions of transverse. Otherwise,                            normal appearing ascending and cecum. Biopsied                            right colon/left colon/RS-Rectum for histology                            purposes.                           - Many 4 to 15 mm polyps at the recto-sigmoid                            colon, in the sigmoid colon, in the descending                            colon, in the transverse colon and in the ascending                            colon, 12 of these removed with a cold snare.                            Likely inflammatory in nature. Resected and                            retrieved.                           - Diverticulosis in the recto-sigmoid colon, in the                            sigmoid colon, in the descending colon, in the                            transverse colon, in the ascending colon and in the  cecum.                           - Stensosis/narrowing in the recto-sigmoid colon                            traversed (though significant congestion noted as                            well as diverticulosis in the region..                           - Non-bleeding non-thrombosed internal hemorrhoids. Recommendation:           - The patient will be observed post-procedure,                            until all discharge criteria are met.                           - Discharge patient to home.                           - Patient has a contact number available for                            emergencies. The signs and symptoms of potential                            delayed complications were discussed with the                            patient. Return  to normal activities tomorrow.                            Written discharge instructions were provided to the                            patient.                           - Low fiber diet.                           - Continue present medications.                           - Await pathology results.                           - Repeat colonoscopy for surveillance based on                            pathology results.                           - Followup with [facility] in setting of next  steps in potential evaluation in regards to role of                            surgical intervention for potential decreasing risk                            of recurrent diverticular perforation will be had.                           - Continue Mesalamine for now.                           - If evidence of active chronic colitis persists                            then Immunomodulator therapy/Immunosuppressive                            therapy may need to be considered.                           - The findings and recommendations were discussed                            with the patient.                           - The findings and recommendations were discussed                            with the patient's family. Justice Britain, MD 11/13/2019 10:57:06 AM

## 2019-11-14 ENCOUNTER — Telehealth: Payer: Self-pay | Admitting: Gastroenterology

## 2019-11-14 NOTE — Telephone Encounter (Signed)
Pt is requesting prescription for Lialda sent to [facility]. Pt stated that another MD used to prescribed this med but last time he saw Dr. Rush Landmark he told pt that he wanted him to continue taking it.

## 2019-11-15 ENCOUNTER — Telehealth: Payer: Self-pay

## 2019-11-15 ENCOUNTER — Other Ambulatory Visit: Payer: Self-pay

## 2019-11-15 DIAGNOSIS — K51919 Ulcerative colitis, unspecified with unspecified complications: Secondary | ICD-10-CM

## 2019-11-15 MED ORDER — MESALAMINE 1.2 G PO TBEC: 4.8000 g | DELAYED_RELEASE_TABLET | Freq: Every day | ORAL | 6 refills | Status: DC

## 2019-11-15 NOTE — Telephone Encounter (Signed)
  Follow up Call-  Call back number 11/13/2019  Post procedure Call Back phone  # [phone]  Permission to leave phone message Yes  Some recent data might be hidden     Patient questions:  Do you have a fever, pain , or abdominal swelling? No. Pain Score  0 *  Have you tolerated food without any problems? Yes.    Have you been able to return to your normal activities? Yes.    Do you have any questions about your discharge instructions: Diet   No. Medications  No. Follow up visit  No.  Do you have questions or concerns about your Care? Patient needs a new prescription for Mesalamine, states he only has 2 days worth left.   Actions: * If pain score is 4 or above: No action needed, pain <4.  1. Have you developed a fever since your procedure? No  2.   Have you had an respiratory symptoms (SOB or cough) since your procedure? no  3.   Have you tested positive for COVID 19 since your procedure no  4.   Have you had any family members/close contacts diagnosed with the COVID 19 since your procedure?  no   If yes to any of these questions please route to Joylene John, RN and Joella Prince, RN

## 2019-11-15 NOTE — Telephone Encounter (Signed)
Prescription for Lialda sent to [facility].

## 2019-11-15 NOTE — Telephone Encounter (Signed)
Thanks for the update. Patty or Covering RN, please send an updated prescription with 12-month of refills for the patient's Mesalamine/Lialda as currently dosed a total of 4.8 g/day. Thank you. GM

## 2019-11-16 ENCOUNTER — Other Ambulatory Visit: Payer: Self-pay | Admitting: Physician Assistant

## 2019-11-16 ENCOUNTER — Encounter: Payer: Self-pay | Admitting: Gastroenterology

## 2019-11-16 ENCOUNTER — Telehealth: Payer: Self-pay | Admitting: Gastroenterology

## 2019-11-16 DIAGNOSIS — D509 Iron deficiency anemia, unspecified: Secondary | ICD-10-CM

## 2019-11-16 DIAGNOSIS — K51919 Ulcerative colitis, unspecified with unspecified complications: Secondary | ICD-10-CM

## 2019-11-16 MED ORDER — MESALAMINE 1.2 G PO TBEC: 4.8000 g | DELAYED_RELEASE_TABLET | Freq: Every day | ORAL | 1 refills | Status: DC

## 2019-11-16 NOTE — Telephone Encounter (Signed)
Pt calling in reference to rf for Lialda. He called his pharmacy this morning and they have not received prescription yet. Pt states he will run out of pills today and is asking if we can call it in.

## 2019-11-16 NOTE — Telephone Encounter (Addendum)
Called and spoke with pharmacist at [facility]. Refill given. Pt has been informed.

## 2019-11-16 NOTE — Telephone Encounter (Signed)
See previous encounter

## 2019-12-13 ENCOUNTER — Other Ambulatory Visit: Payer: Self-pay | Admitting: Gastroenterology

## 2020-01-06 ENCOUNTER — Other Ambulatory Visit: Payer: Self-pay | Admitting: Gastroenterology

## 2020-01-06 DIAGNOSIS — K51919 Ulcerative colitis, unspecified with unspecified complications: Secondary | ICD-10-CM

## 2020-02-29 ENCOUNTER — Other Ambulatory Visit: Payer: Self-pay | Admitting: Gastroenterology

## 2020-02-29 DIAGNOSIS — K51919 Ulcerative colitis, unspecified with unspecified complications: Secondary | ICD-10-CM

## 2020-05-12 ENCOUNTER — Other Ambulatory Visit: Payer: Self-pay | Admitting: Gastroenterology

## 2020-05-12 DIAGNOSIS — K51919 Ulcerative colitis, unspecified with unspecified complications: Secondary | ICD-10-CM

## 2020-07-03 ENCOUNTER — Other Ambulatory Visit: Payer: Self-pay | Admitting: Gastroenterology

## 2020-07-03 DIAGNOSIS — K51919 Ulcerative colitis, unspecified with unspecified complications: Secondary | ICD-10-CM

## 2020-08-30 ENCOUNTER — Other Ambulatory Visit: Payer: Self-pay | Admitting: Gastroenterology

## 2020-08-30 DIAGNOSIS — K51919 Ulcerative colitis, unspecified with unspecified complications: Secondary | ICD-10-CM

## 2020-10-24 ENCOUNTER — Encounter: Payer: Self-pay | Admitting: Gastroenterology

## 2020-10-26 ENCOUNTER — Other Ambulatory Visit: Payer: Self-pay | Admitting: Gastroenterology

## 2020-10-26 DIAGNOSIS — K51919 Ulcerative colitis, unspecified with unspecified complications: Secondary | ICD-10-CM

## 2020-12-30 ENCOUNTER — Other Ambulatory Visit: Payer: Self-pay | Admitting: Gastroenterology

## 2020-12-30 DIAGNOSIS — K51919 Ulcerative colitis, unspecified with unspecified complications: Secondary | ICD-10-CM

## 2021-02-28 ENCOUNTER — Other Ambulatory Visit: Payer: Self-pay | Admitting: Gastroenterology

## 2021-02-28 DIAGNOSIS — K51919 Ulcerative colitis, unspecified with unspecified complications: Secondary | ICD-10-CM

## 2021-04-27 ENCOUNTER — Other Ambulatory Visit: Payer: Self-pay | Admitting: Gastroenterology

## 2021-04-27 DIAGNOSIS — K51919 Ulcerative colitis, unspecified with unspecified complications: Secondary | ICD-10-CM

## 2021-06-25 ENCOUNTER — Other Ambulatory Visit: Payer: Self-pay | Admitting: Gastroenterology

## 2021-06-25 DIAGNOSIS — K51919 Ulcerative colitis, unspecified with unspecified complications: Secondary | ICD-10-CM

## 2021-08-10 IMAGING — CT CT ABD-PELV W/ CM
2 of 5 series · 14 of 46 positions shown, 16 images · IV contrast (omnipaque)
Comparison: 07/19/2019

CLINICAL DATA: Vomiting diarrhea

EXAM:
CT ABDOMEN AND PELVIS WITH CONTRAST
TECHNIQUE: Multidetector CT imaging of the abdomen and pelvis was performed
using the standard protocol following bolus administration of
intravenous contrast.
CONTRAST:  100mL OMNIPAQUE IOHEXOL 300 MG/ML  SOLN

[Series 2: axial st · axial · 0.79mm/px · z∈[+833,+1323]mm · 11 of 112 slices shown, 13 images]
[im 7/112  soft-tissue]
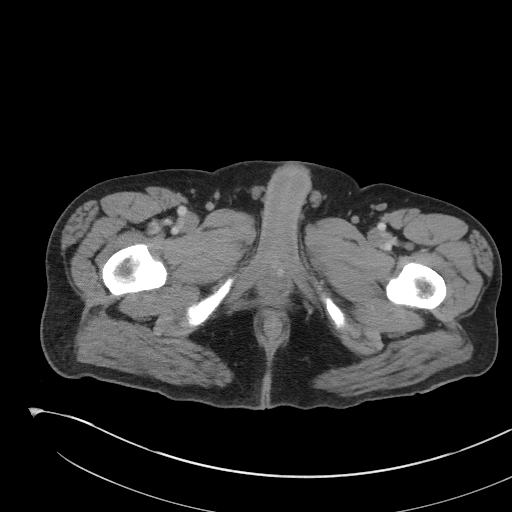
[im 7/112  bone]
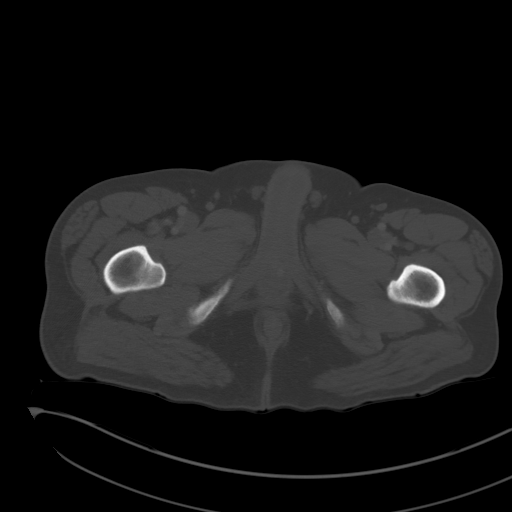
[im 20/112  soft-tissue]
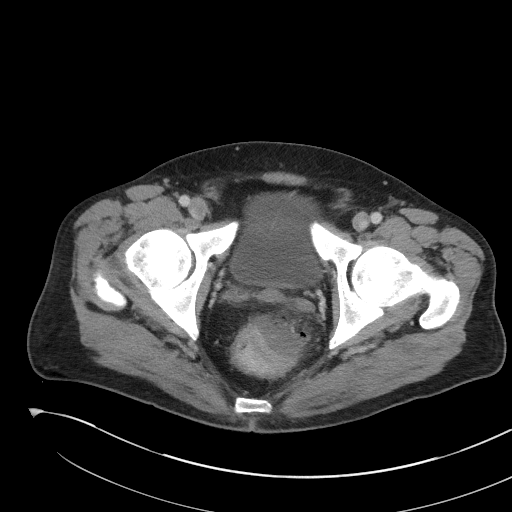
[im 27/112  soft-tissue]
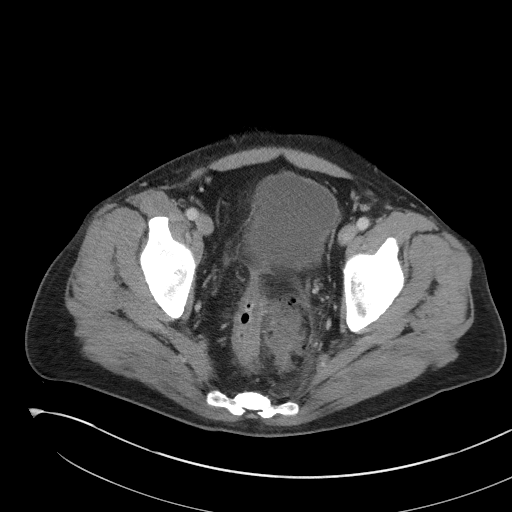
[im 40/112  soft-tissue]
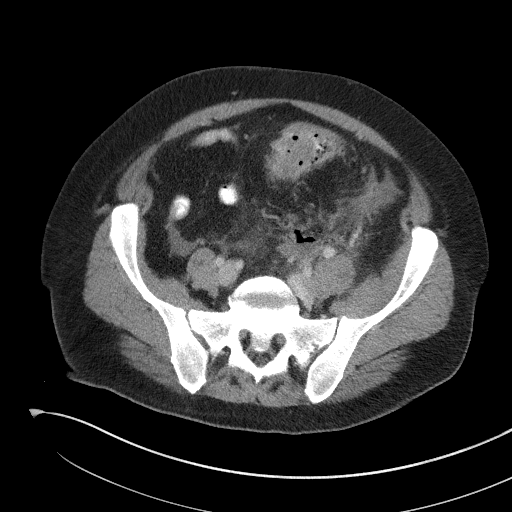
[im 46/112  soft-tissue]
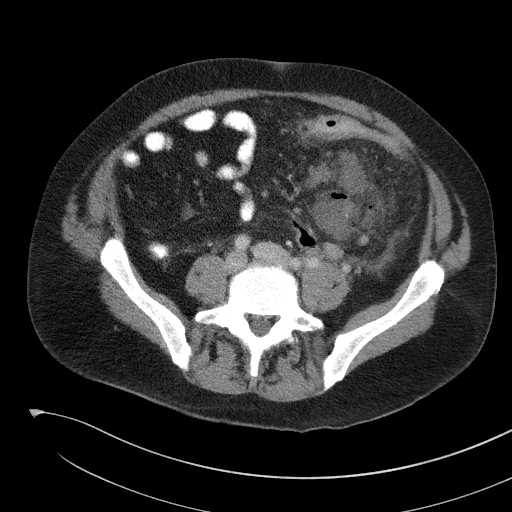
[im 59/112  soft-tissue]
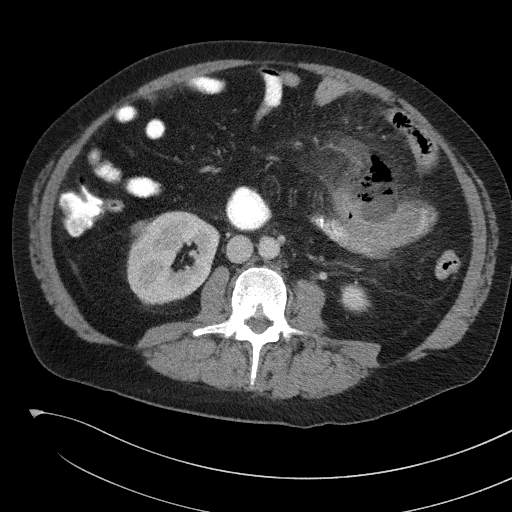
[im 66/112  soft-tissue]
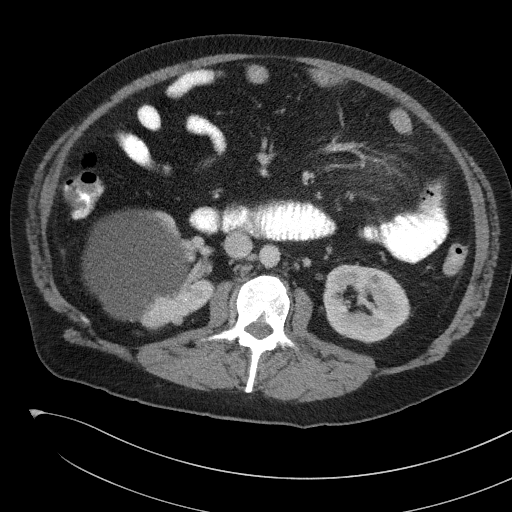
[im 72/112  soft-tissue]
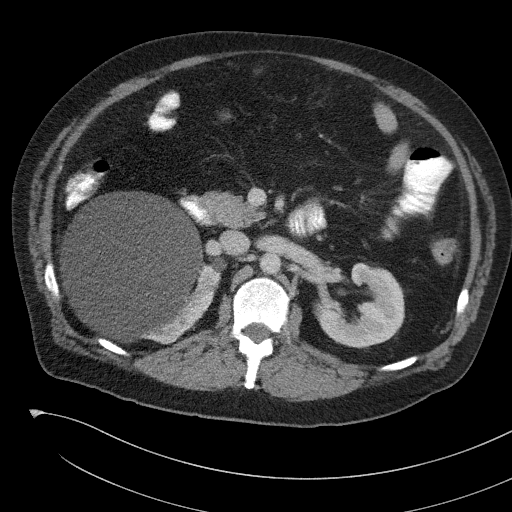
[im 85/112  soft-tissue]
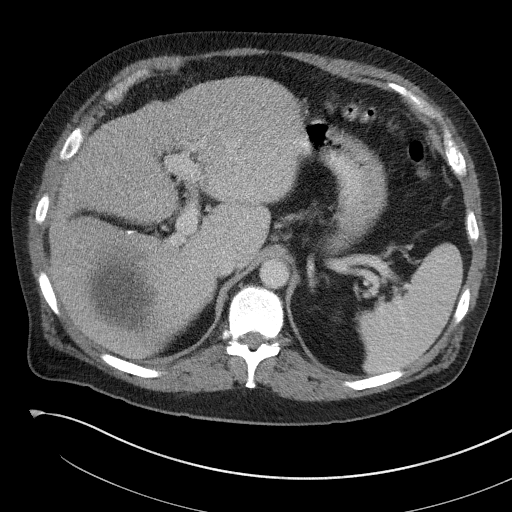
[im 85/112  bone]
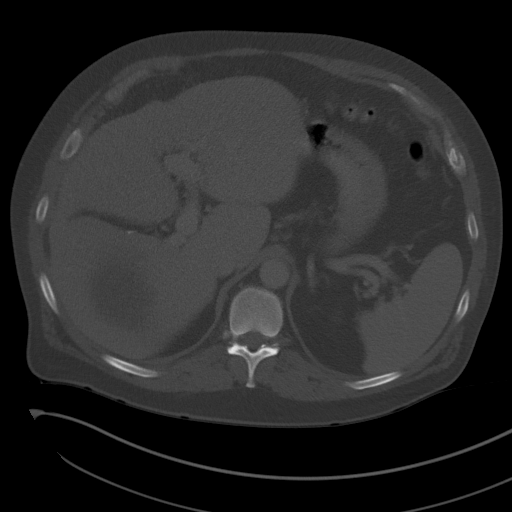
[im 92/112  soft-tissue]
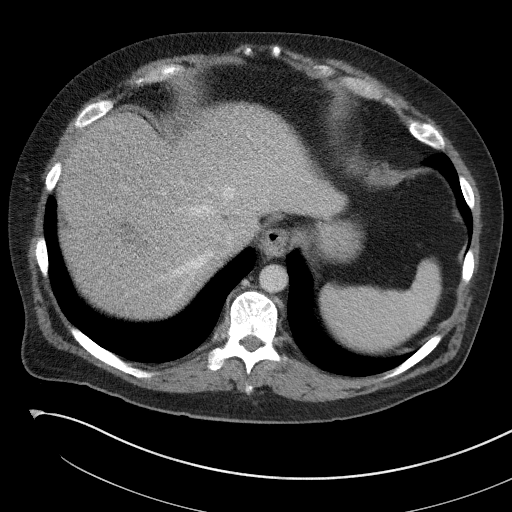
[im 105/112  soft-tissue]
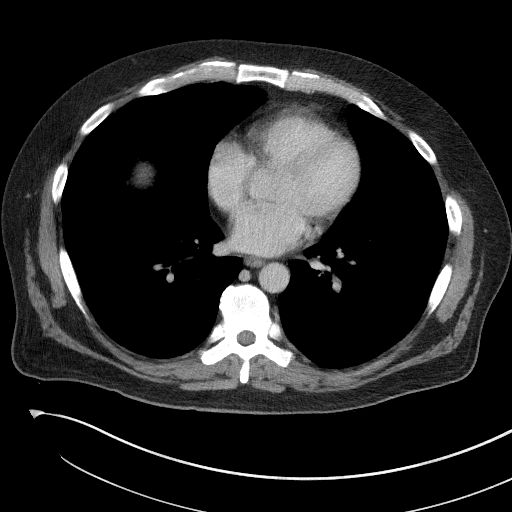

[Series 6: coronal st · coronal · 0.86mm/px · 3 of 117 slices shown]
[im 39/117  soft-tissue]
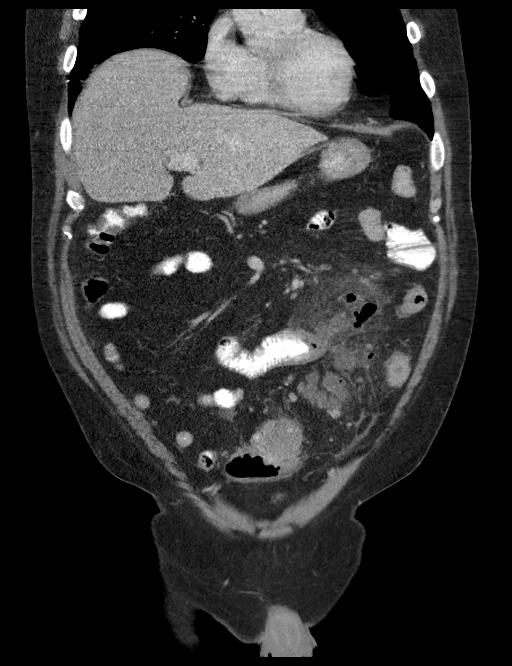
[im 52/117  soft-tissue]
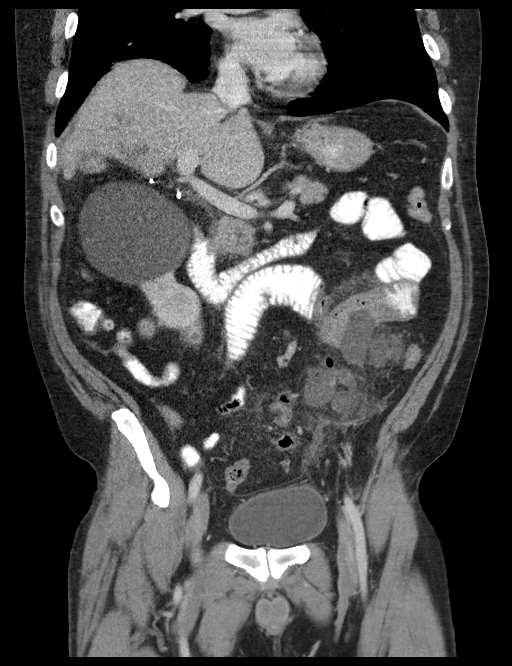
[im 65/117  soft-tissue]
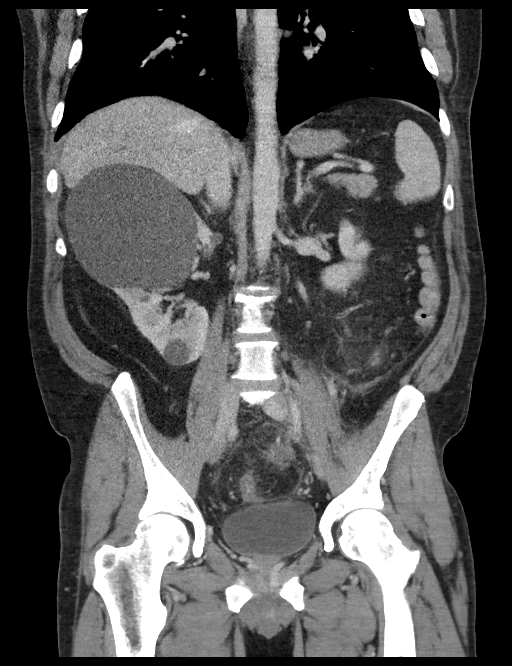

[14 of 46 positions shown; findings below may reference images not displayed]

FINDINGS: Lower chest: Lung bases are clear. No pleural or pericardial
effusion.

Hepatobiliary: Mildly heterogeneous appearance of the anterior
posterior division superior aspect RIGHT hemi liver likely related
to prior portal thrombosis as before. Lobular hepatic contours. Post
cholecystectomy.

Pancreas: Pancreas is normal. No ductal dilation, visible lesion or
inflammation.

Spleen: Spleen is normal size without focal lesion.

Adrenals/Urinary Tract: Adrenal glands are normal.

Large RIGHT renal cyst and areas of mixed attenuation as described
on the recent CT evaluation. No hydronephrosis.

Stomach/Bowel: Signs of IMV thrombosis obscured by portal venous gas
that now extends into the inferior mesenteric vein. Venous systems
draining both the rectum and sigmoid colon show gas within the lumen
with irregular enhancing gas and fluid containing collections that
involves the mesorectum and extend along the LEFT flank with
interloop fluid and gas extending to the LEFT upper quadrant
adjacent to the jejunum (image 57, series [DATE] x 3.1 cm gas and
fluid containing collection. Gas and fluid in the LEFT hemiabdomen
beneath the sigmoid colon and descending transition measuring
approximately 9 cm in greatest length and 2.6 cm and with. Other
small pockets of fluid and gas tracking in the sigmoid mesentery and
beyond towards the jejunum as described. Collection in the
mesorectum adjacent to the rectum along the LEFT lateral aspect of
the rectum measures 4 x 3.7 cm. This and many of the collections in
the sigmoid mesocolon are ill-defined.

Numerous lymph nodes throughout the retroperitoneum. Many of these
are small. The largest in the LEFT retroperitoneum adjacent to the
ureter and gonadal vessels measuring 1.3 cm.

Vascular/Lymphatic: Gas dissecting around and likely within the
lumen of the IMV which was previously shown to have thrombus in the
lumen. Retroperitoneal nodal enlargement of a node adjacent to the
LEFT common iliac and ureter as described.

No pelvic adenopathy aside from this enlarged lymph node but with
scattered lymph nodes throughout the sigmoid mesentery without
pathologic enlargement but abnormal with respect to number.

Reproductive: Heterogeneous prostate, nonspecific and unchanged.

Other: Loculated retroperitoneal and interloop fluid collections as
described. No free intraperitoneal air.

Musculoskeletal: No acute musculoskeletal process. Spinal
degenerative changes.
IMPRESSION: 1. Gas and fluid in the LEFT retroperitoneum and extending into
peritoneal spaces via mesenteric pathways in the LEFT abdomen,
associated with interloop fluid and gas as well as loculated
collections in the mesorectum. Extensive distribution of this
process favors septic thrombophlebitis, likely associated with focal
colonic perforation. Process best seen on image 76 of series 2 were
gas along the IMV at the site of previous thrombus tracks into the
LEFT retroperitoneum, extending superiorly and inferiorly from this
epicenter are numerous gas and fluid containing collections.
2. Again with focal colonic thickening in the setting of UC colonic
neoplasm is considered, as discussed on the previous exam.
3. Secondary enteritis due to extensive inflammation. Enteritis
within the jejunum on today's exam
4. Numerous lymph nodes throughout the retroperitoneum and sigmoid
mesentery. Many of these are small and some are abnormal with
respect to number. The largest in the LEFT retroperitoneum adjacent
to the ureter and gonadal vessels measuring 1.3 cm.
5. Large RIGHT renal cyst and areas of mixed attenuation as
described on the recent CT evaluation. Follow-up renal protocol CT
when the patient is able is suggested to exclude the possibility of
new enhancing lesions, solid renal neoplasm, as discussed on the
previous CT evaluation.
6. Mildly heterogeneous appearance of the anterior posterior
division superior aspect RIGHT hemi liver likely related to prior
portal thrombosis as before.

Critical Value/emergent results were called by telephone at the time
of interpretation on 08/13/2019 at [DATE] to provider Dr. Choi, Who
verbally acknowledged these results.

## 2021-08-20 ENCOUNTER — Other Ambulatory Visit: Payer: Self-pay | Admitting: Gastroenterology

## 2021-08-20 DIAGNOSIS — K51919 Ulcerative colitis, unspecified with unspecified complications: Secondary | ICD-10-CM

## 2021-09-21 ENCOUNTER — Other Ambulatory Visit: Payer: Self-pay | Admitting: Gastroenterology

## 2021-09-21 DIAGNOSIS — K51919 Ulcerative colitis, unspecified with unspecified complications: Secondary | ICD-10-CM

## 2021-10-06 ENCOUNTER — Telehealth: Payer: Self-pay | Admitting: Gastroenterology

## 2021-10-06 DIAGNOSIS — K51919 Ulcerative colitis, unspecified with unspecified complications: Secondary | ICD-10-CM

## 2021-10-07 MED ORDER — MESALAMINE 1.2 G PO TBEC: 4.8000 g | DELAYED_RELEASE_TABLET | Freq: Every day | ORAL | 1 refills | Status: DC

## 2021-10-07 NOTE — Telephone Encounter (Signed)
Left message on voicemail that Rx for Lialda was sent to the pharmacy.

## 2021-11-18 ENCOUNTER — Ambulatory Visit: Payer: 59 | Admitting: Gastroenterology

## 2021-11-18 ENCOUNTER — Encounter: Payer: Self-pay | Admitting: Gastroenterology

## 2021-11-18 ENCOUNTER — Other Ambulatory Visit (INDEPENDENT_AMBULATORY_CARE_PROVIDER_SITE_OTHER): Payer: 59

## 2021-11-18 VITALS — BP 136/80 | HR 93 | Ht 69.0 in

## 2021-11-18 DIAGNOSIS — R933 Abnormal findings on diagnostic imaging of other parts of digestive tract: Secondary | ICD-10-CM

## 2021-11-18 DIAGNOSIS — Z8719 Personal history of other diseases of the digestive system: Secondary | ICD-10-CM

## 2021-11-18 DIAGNOSIS — K51919 Ulcerative colitis, unspecified with unspecified complications: Secondary | ICD-10-CM

## 2021-11-18 DIAGNOSIS — K56699 Other intestinal obstruction unspecified as to partial versus complete obstruction: Secondary | ICD-10-CM | POA: Diagnosis not present

## 2021-11-18 DIAGNOSIS — K5720 Diverticulitis of large intestine with perforation and abscess without bleeding: Secondary | ICD-10-CM | POA: Diagnosis not present

## 2021-11-18 DIAGNOSIS — K51019 Ulcerative (chronic) pancolitis with unspecified complications: Secondary | ICD-10-CM | POA: Diagnosis not present

## 2021-11-18 LAB — CBC
HCT: 39.4 % (ref 39.0–52.0)
Hemoglobin: 13.5 g/dL (ref 13.0–17.0)
MCHC: 34.3 g/dL (ref 30.0–36.0)
MCV: 80.7 fl (ref 78.0–100.0)
Platelets: 194 10*3/uL (ref 150.0–400.0)
RBC: 4.88 Mil/uL (ref 4.22–5.81)
RDW: 13.9 % (ref 11.5–15.5)
WBC: 6.2 10*3/uL (ref 4.0–10.5)

## 2021-11-18 LAB — C-REACTIVE PROTEIN: CRP: <1 mg/dL (ref 0.5–20.0)

## 2021-11-18 LAB — COMPREHENSIVE METABOLIC PANEL
ALT: 15 U/L (ref 0–53)
AST: 17 U/L (ref 0–37)
Albumin: 4 g/dL (ref 3.5–5.2)
Alkaline Phosphatase: 83 U/L (ref 39–117)
BUN: 16 mg/dL (ref 6–23)
CO2: 22 mEq/L (ref 19–32)
Calcium: 9 mg/dL (ref 8.4–10.5)
Chloride: 105 mEq/L (ref 96–112)
Creatinine, Ser: 0.83 mg/dL (ref 0.40–1.50)
GFR: 95.2 mL/min (ref >60.00)
Glucose, Bld: 109 mg/dL — ABNORMAL HIGH (ref 70–99)
Potassium: 3.5 mEq/L (ref 3.5–5.1)
Sodium: 136 mEq/L (ref 135–145)
Total Bilirubin: 0.3 mg/dL (ref 0.2–1.2)
Total Protein: 7.8 g/dL (ref 6.0–8.3)

## 2021-11-18 LAB — SEDIMENTATION RATE: Sed Rate: 39 mm/hr — ABNORMAL HIGH (ref 0–20)

## 2021-11-18 MED ORDER — MESALAMINE 1.2 G PO TBEC: 4.8000 g | DELAYED_RELEASE_TABLET | Freq: Every day | ORAL | 13 refills | Status: DC

## 2021-11-18 NOTE — Patient Instructions (Signed)
If you are age 60 or younger, your body mass index should be between 19-25. Your Body mass index is 27.62 kg/m. If this is out of the aformentioned range listed, please consider follow up with your Primary Care Provider.  ________________________________________________________  The [facility] providers would like to encourage you to use [facility] to communicate with providers for non-urgent requests or questions.  Due to long hold times on the telephone, sending your provider a message by [facility] may be a faster and more efficient way to get a response.  Please allow 48 business hours for a response.  Please remember that this is for non-urgent requests.  _______________________________________________________  Your provider has requested that you go to the basement level for lab work before leaving today. Press "B" on the elevator. The lab is located at the first door on the left as you exit the elevator.  Due to recent changes in healthcare laws, you may see the results of your imaging and laboratory studies on MyChart before your provider has had a chance to review them.  We understand that in some cases there may be results that are confusing or concerning to you. Not all laboratory results come back in the same time frame and the provider may be waiting for multiple results in order to interpret others.  Please give [place] 48 hours in order for your provider to thoroughly review all the results before contacting the office for clarification of your results.   We have sent the following medications to your pharmacy for you to pick up at your convenience:  Genia Hotter will need a recall colonoscopy in December 2023. We will contact you to schedule this appointment.  Thank you for entrusting me with your care and choosing [facility].  Dr Rush Landmark

## 2021-11-21 DIAGNOSIS — K5720 Diverticulitis of large intestine with perforation and abscess without bleeding: Secondary | ICD-10-CM | POA: Insufficient documentation

## 2021-11-21 DIAGNOSIS — R933 Abnormal findings on diagnostic imaging of other parts of digestive tract: Secondary | ICD-10-CM | POA: Insufficient documentation

## 2021-11-21 DIAGNOSIS — K56699 Other intestinal obstruction unspecified as to partial versus complete obstruction: Secondary | ICD-10-CM | POA: Insufficient documentation

## 2021-11-21 NOTE — Progress Notes (Signed)
Brett Bright VISIT   Primary Care Provider Patient, No Pcp Per No address on file None   Patient Profile: Brett Bright is a 61 y.o. male with a pmh significant for ulcerative colitis (complicated by areas of stenosis inflammatory in nature of the colon), diverticulosis and diverticulitis (complicated by prior perforation treated medically and with IR drainage), GERD.  The patient presents to the [facility] for an evaluation and management of problem(s) noted below:  Problem List 1. Ulcerative pancolitis with complication ([place])   2. Diverticulitis of large intestine with perforation without bleeding   3. Colonic stricture ([place])   4. Abnormal colonoscopy     History of Present Illness Please see prior notes for full details of HPI.  Interval History I have not seen the patient in over a year since his last colonoscopy.  The colonoscopy itself showed evidence of multiple areas of narrowing and congestion but the colon itself was disease-free based on biopsies obtained throughout without any evidence of active ulcerative colitis on his current regimen of mesalamine.  The patient states that he is doing quite well and is back to work and doing what he needs to from a life perspective.  IBD diary BMs per day -between 2 and 3 formed BMs per day  Nocturnal BMs -none Blood -none Mucous -none Tenesmus -none Urgency -none Skin Manifestations -none Eye Manifestations -none (after his cataracts were taking care of his vision is great) Joint Manifestations -none  GI Review of Systems Positive as above including medically controlled pyrosis Negative for dysphagia, odynophagia, nausea, vomiting, pain, alteration of bowel habits, melena, hematochezia  Review of Systems General: Denies fevers/chills/weight loss unintentionally HEENT: Denies oral lesions Cardiovascular: Denies chest pain Pulmonary: Denies shortness of  breath Gastroenterological: See HPI Genitourinary: Denies darkened urine Hematological: Denies easy bruising/bleeding Dermatological: Denies jaundice Psychological: Mood is stable   Medications Current Outpatient Medications  Medication Sig Dispense Refill   acetaminophen (TYLENOL) 325 MG tablet Take 325 mg by mouth every 6 (six) hours as needed for fever. (Patient not taking: Reported on 11/13/2019)     amoxicillin-clavulanate (AUGMENTIN) 875-125 MG tablet Take 1 tablet by mouth every 12 (twelve) hours. (Patient not taking: Reported on 11/13/2019) 8 tablet 0   ferrous sulfate (FEROSUL) 325 (65 FE) MG tablet Take 1 tablet (325 mg total) by mouth daily with breakfast. 60 tablet 3   mesalamine (LIALDA) 1.2 g EC tablet Take 4 tablets (4.8 g total) by mouth daily at 2 PM. 120 tablet 13   omeprazole (PRILOSEC) 20 MG capsule Take 20 mg by mouth daily.     No current facility-administered medications for this visit.    Allergies No Known Allergies  Histories Past Medical History:  Diagnosis Date   Anemia    On Iron -- 2019   Blood transfusion without reported diagnosis 2019   Clotting disorder ([place]) 2018   Ulcerative colitis with rectal bleeding ([place]) 07/2018   Past Surgical History:  Procedure Laterality Date   APPENDECTOMY     BIOPSY  08/16/2018   Procedure: BIOPSY;  Surgeon: Daneil Dolin, MD;  [facility];  Service: Endoscopy;;   CATARACT EXTRACTION     CHOLECYSTECTOMY     COLONOSCOPY N/A 03/23/2016   Dr. Oneida Alar: Thrombosed external hemorrhoid, internal hemorrhoids, diverticulosis, segmental moderate inflammation sigmoid colon secondary due to colitis with benign biopsies.  Next colonoscopy 5 years due to poor prep right colon.   COLONOSCOPY N/A 08/16/2018   Dr. Gala Romney: pan-ulcerative colitis,  diverticulosis. Bx c/w UC   COLONOSCOPY     ESOPHAGOGASTRODUODENOSCOPY N/A 03/23/2016   Dr. Oneida Alar: Gastritis, no H. pylori, multiple small sessile polyps biopsy consistent  with fundic gland, mild duodenitis.   ESOPHAGOGASTRODUODENOSCOPY N/A 08/16/2018   Procedure: ESOPHAGOGASTRODUODENOSCOPY (EGD);  Surgeon: Daneil Dolin, MD;  [facility];  Service: Endoscopy;  Laterality: N/A;   IR RADIOLOGIST EVAL & MGMT  09/12/2019   IR RADIOLOGIST EVAL & MGMT  09/27/2019   UPPER GASTROINTESTINAL ENDOSCOPY     VASECTOMY     Social History   Socioeconomic History   Marital status: Married    Spouse name: Not on file   Number of children: 3   Years of education: Not on file   Highest education level: Not on file  Occupational History   Occupation: Primary school teacher    Comment: At [employer]   Tobacco Use   Smoking status: Never   Smokeless tobacco: Never  Vaping Use   Vaping Use: Never used  Substance and Sexual Activity   Alcohol use: No   Drug use: No   Sexual activity: Not on file  Other Topics Concern   Not on file  Social History Narrative   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   He is married and has 3 children, who are ages 42, 63, and 27 as of 2017.   The 69 and 11 year old children are still living at home, but the 60 yo is married and lives elsewhere.   The patient lives in [place], [place].      His primary care is Brett Bright in [place], [place].   Social Determinants of Health   Financial Resource Strain: Not on file  Food Insecurity: Not on file  Transportation Needs: Not on file  Physical Activity: Not on file  Stress: Not on file  Social Connections: Not on file  Intimate Partner Violence: Not on file   Family History  Problem Relation Age of Onset   Stroke Mother    Cancer Maternal Grandfather        Unknown type of cancer   Heart attack Paternal Grandmother    Cancer Maternal Aunt        Possibly lung cancer   Cancer Paternal Uncle        Unknown type of cancer   Cancer Paternal Uncle        Unknown type of cancer   Colon cancer Neg Hx    Colon polyps Neg Hx     Esophageal cancer Neg Hx    Rectal cancer Neg Hx    Stomach cancer Neg Hx    I have reviewed his medical, social, and family history in detail and updated the electronic medical record as necessary.    PHYSICAL EXAMINATION  BP 136/80   Pulse 93   Ht 5' 9"  (1.753 m)   SpO2 96%   BMI 27.62 kg/m  Wt Readings from Last 3 Encounters:  11/13/19 187 lb (84.8 kg)  10/15/19 187 lb (84.8 kg)  09/20/19 192 lb (87.1 kg)  GEN: NAD, appears stated age, doesn't appear chronically ill PSYCH: Cooperative, without pressured speech EYE: Conjunctivae pink, sclerae anicteric ENT: MMM CV: Nontachycardic RESP: No audible wheezing GI: NABS, soft, NT/ND, without rebound or guarding MSK/EXT: No lower extremity edema SKIN: No jaundice NEURO:  Alert & Oriented x 3, no focal deficits   REVIEW OF DATA  I reviewed the following data at  the time of this encounter:  GI Procedures and Studies  August 2021 colonoscopy - Hemorrhoids found on digital rectal exam. - The examined portion of the ileum was normal. Biopsied. - Mild (Mayo Score 1) ulcerative colitis on the left colon and portions of transverse. Otherwise, normal appearing ascending and cecum. Biopsied right colon/left colon/RS-Rectum for histology purposes. - Many 4 to 15 mm polyps at the recto-sigmoid colon, in the sigmoid colon, in the descending colon, in the transverse colon and in the ascending colon, 12 of these removed with a cold snare. Likely inflammatory in nature. Resected and retrieved. - Diverticulosis in the recto-sigmoid colon, in the sigmoid colon, in the descending colon, in the transverse colon, in the ascending colon and in the cecum. - Stensosis/narrowing in the recto-sigmoid colon traversed (though significant congestion noted as well as diverticulosis in the region.. - Non-bleeding non-thrombosed internal hemorrhoids.  Pathology Diagnosis 1. Surgical [P], small bowel, terminal ileum biopsies - ILEAL MUCOSA WITH LYMPHOID  AGGREGATES. - NO ACTIVE INFLAMMATION, CHRONIC CHANGES OR GRANULOMAS. - 2. Surgical [P], right colon biopsies - UNREMARKABLE COLONIC MUCOSA. - NO ACTIVE INFLAMMATION OR GRANULOMAS. - NEGATIVE FOR DYSPLASIA. 3. Surgical [P], pseudopolyps, colon, ascending, transverse, descending, sigmoid, polyp (12) - INFLAMMATORY POLYPS. - NO ADENOMATOUS CHANGE OR CARCINOMA. - NEGATIVE FOR DYSPLASIA. 4. Surgical [P], left colon biopsies - UNREMARKABLE COLONIC MUCOSA. - NO ACTIVE INFLAMMATION OR GRANULOMAS. - NEGATIVE FOR DYSPLASIA. 5. Surgical [P], colon, rectosigmoid and rectum biopsies - COLONIC MUCOSA WITH SLIGHT EDEMA. - NO ACTIVE INFLAMMATION OR GRANULOMAS. - NEGATIVE FOR DYSPLASIA.  Laboratory Studies  No relevant studies to review  Imaging Studies  No new imaging studies to review   ASSESSMENT  Mr. Evola is a 61 y.o. male with a pmh significant for ulcerative colitis (complicated by areas of stenosis inflammatory in nature of the colon), diverticulosis and diverticulitis (complicated by prior perforation treated medically and with IR drainage), GERD.  The patient is seen today for evaluation and management of:  1. Ulcerative pancolitis with complication ([place])   2. Diverticulitis of large intestine with perforation without bleeding   3. 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 Abnormal colonoscopy    The patient is hemodynamically and clinically stable.  Although his colon in 2021 did show some areas of narrowing and possible stenosis, he has been doing quite well.  He is having normal formed bowel movements and his past biopsies showed no evidence of active disease.  I suspect that he is in both a clinical and endoscopic remission.  We will plan to reevaluate how the colon looks to ensure that this is the case and if it is then to just continue his current mesalamine therapy.  We are going to get some updated labs as well as a fecal calprotectin right now.  He will undergo colonoscopy later this year.   He has had a previous endoscopy done by a previous gastroenterologist through the [facility] and had no issues or abnormalities noted on that so we will not plan to proceed with an upper endoscopy unless he were to develop any new symptoms.  The risks and benefits of endoscopic evaluation were discussed with the patient; these include but are not limited to the risk of perforation, infection, bleeding, missed lesions, lack of diagnosis, severe illness requiring hospitalization, as well as anesthesia and sedation related illnesses.  The patient and/or family is agreeable to proceed.  All patient questions were answered to the best of my ability, and the patient agrees to the aforementioned  plan of action with follow-up as indicated.   PLAN  Laboratories as outlined below Stool studies as outlined below May continue PPI daily Continue Lialda 4.8 g daily Proceed with scheduling surveillance colonoscopy at some point later this year Should patient have another bout of diverticulitis or most especially complicated diverticulitis surgical intervention should be strongly considered   Orders Placed This Encounter  Procedures   Calprotectin, Fecal   CBC   Comp Met (CMET)   Sedimentation rate   C-reactive protein    New Prescriptions   No medications on file   Modified Medications   Modified Medication Previous Medication   MESALAMINE (LIALDA) 1.2 G EC TABLET mesalamine (LIALDA) 1.2 g EC tablet      Take 4 tablets (4.8 g total) by mouth daily at 2 PM.    Take 4 tablets (4.8 g total) by mouth daily at 2 PM.    Planned Follow Up No follow-ups on file.   Total Time in Face-to-Face and in Coordination of Care for patient including independent/personal interpretation/review of prior testing, medical history, examination, medication adjustment, communicating results with the patient directly, and documentation within the EHR is 25 minutes.   Justice Britain, MD [facility]oscopy Office # [phone]

## 2022-05-12 ENCOUNTER — Encounter: Payer: Self-pay | Admitting: Gastroenterology

## 2022-12-11 ENCOUNTER — Other Ambulatory Visit: Payer: Self-pay | Admitting: Gastroenterology

## 2022-12-11 DIAGNOSIS — K51019 Ulcerative (chronic) pancolitis with unspecified complications: Secondary | ICD-10-CM

## 2023-01-08 ENCOUNTER — Other Ambulatory Visit: Payer: Self-pay | Admitting: Gastroenterology

## 2023-01-08 DIAGNOSIS — K51019 Ulcerative (chronic) pancolitis with unspecified complications: Secondary | ICD-10-CM

## 2023-02-06 ENCOUNTER — Other Ambulatory Visit: Payer: Self-pay | Admitting: Gastroenterology

## 2023-02-06 DIAGNOSIS — K51019 Ulcerative (chronic) pancolitis with unspecified complications: Secondary | ICD-10-CM

## 2023-02-21 ENCOUNTER — Telehealth: Payer: Self-pay | Admitting: Gastroenterology

## 2023-02-21 NOTE — Telephone Encounter (Signed)
Inbound call from patient requesting a refill for mesalamine. Also requesting a call back. Please advise, thank you.

## 2023-02-22 NOTE — Telephone Encounter (Signed)
Patient advised that mesalamine was sent to pharmacy as requested.  Patient is over due for follow up appointment.  Patient was scheduled to follow up with Dr Meridee Score in Jan 2025.  Patient agreed to plan and verbalized understanding.  No further questions.

## 2023-02-24 ENCOUNTER — Other Ambulatory Visit: Payer: Self-pay | Admitting: Gastroenterology

## 2023-02-24 DIAGNOSIS — K51019 Ulcerative (chronic) pancolitis with unspecified complications: Secondary | ICD-10-CM

## 2023-03-24 ENCOUNTER — Ambulatory Visit: Payer: Managed Care, Other (non HMO) | Admitting: Gastroenterology

## 2023-03-24 ENCOUNTER — Encounter: Payer: Self-pay | Admitting: Gastroenterology

## 2023-03-24 VITALS — BP 122/86 | HR 104 | Ht 69.0 in | Wt 230.0 lb

## 2023-03-24 DIAGNOSIS — K51019 Ulcerative (chronic) pancolitis with unspecified complications: Secondary | ICD-10-CM

## 2023-03-24 DIAGNOSIS — K5100 Ulcerative (chronic) pancolitis without complications: Secondary | ICD-10-CM

## 2023-03-24 DIAGNOSIS — Z8719 Personal history of other diseases of the digestive system: Secondary | ICD-10-CM | POA: Diagnosis not present

## 2023-03-24 MED ORDER — MESALAMINE 1.2 G PO TBEC: DELAYED_RELEASE_TABLET | ORAL | 2 refills | Status: DC

## 2023-03-24 NOTE — Progress Notes (Signed)
 GASTROENTEROLOGY OUTPATIENT CLINIC VISIT   Primary Care Provider Patient, No Pcp Per No address on file None   Patient Profile: Brett Bright is a 63 y.o. male with a pmh significant for prior cataracts, ulcerative colitis (complicated by areas of stenosis inflammatory in nature of the colon), diverticulosis and diverticulitis (complicated by prior perforation treated medically and with IR drainage), GERD.  The patient presents to the [facility] for an evaluation and management of problem(s) noted below:  Problem List 1. Ulcerative pancolitis without complication (HCC)   2. History of diverticulitis    Discussed the use of AI scribe software for clinical note transcription with the patient, who gave verbal consent to proceed.  History of Present Illness Please see prior notes for full details of HPI.  Interval History The patient presents for follow-up.  He continues on his current Mesalamine  therapy.  He reports that bowel movements have been regular, occurring 3-4 times a day, with normal Bristol scale 4 formation and no presence of blood or mucus.  There have been no instances of nocturnal bowel movements or fecal urgency.  The patient denies any new rashes, nodules, or changes in eyesight.  He has not had any recurrent diverticulitis attacks.  The patient has been adhering to a diet that avoids certain foods, particularly acidic ones, to manage symptoms. There have been no new heartburn symptoms or dyspeptic symptoms.   He would like to consider reintroducing orange juice into his diet, which was previously avoided due to its potential to exacerbate heartburn symptoms.   IBD diary BMs per day -3-4 formed BMs per day  Nocturnal BMs -none Blood -none Mucous -none Tenesmus -none Urgency -none Skin Manifestations -none Eye Manifestations -none Joint Manifestations -none  GI Review of Systems Positive as above including medically controlled  pyrosis Negative for dysphagia, odynophagia, nausea, vomiting, pain, alteration of bowel habits, melena, hematochezia  Review of Systems General: Denies fevers/chills/weight loss unintentionally HEENT: Denies oral lesions Cardiovascular: Denies chest pain Pulmonary: Denies shortness of breath Gastroenterological: See HPI Genitourinary: Denies darkened urine Hematological: Denies easy bruising/bleeding Dermatological: Denies jaundice Psychological: Mood is stable   Medications Current Outpatient Medications  Medication Sig Dispense Refill   acetaminophen  (TYLENOL ) 325 MG tablet Take 325 mg by mouth every 6 (six) hours as needed for fever.     omeprazole (PRILOSEC) 20 MG capsule Take 20 mg by mouth daily.     mesalamine  (LIALDA ) 1.2 g EC tablet TAKE 4 TABLETS BY MOUTH ONCE DAILY AT 2 PM IN THE EVENING 120 tablet 2   No current facility-administered medications for this visit.    Allergies No Known Allergies  Histories Past Medical History:  Diagnosis Date   Anemia    On Iron -- 2019   Blood transfusion without reported diagnosis 2019   Clotting disorder (HCC) 2018   Ulcerative colitis with rectal bleeding (HCC) 07/2018   Past Surgical History:  Procedure Laterality Date   APPENDECTOMY     BIOPSY  08/16/2018   Procedure: BIOPSY;  Surgeon: Shaaron Lamar HERO, MD;  [facility];  Service: Endoscopy;;   CATARACT EXTRACTION     CHOLECYSTECTOMY     COLONOSCOPY N/A 03/23/2016   Dr. harvey: Thrombosed external hemorrhoid, internal hemorrhoids, diverticulosis, segmental moderate inflammation sigmoid colon secondary due to colitis with benign biopsies.  Next colonoscopy 5 years due to poor prep right colon.   COLONOSCOPY N/A 08/16/2018   Dr. Shaaron: pan-ulcerative colitis, diverticulosis. Bx c/w UC   COLONOSCOPY  ESOPHAGOGASTRODUODENOSCOPY N/A 03/23/2016   Dr. harvey: Gastritis, no H. pylori, multiple small sessile polyps biopsy consistent with fundic gland, mild  duodenitis.   ESOPHAGOGASTRODUODENOSCOPY N/A 08/16/2018   Procedure: ESOPHAGOGASTRODUODENOSCOPY (EGD);  Surgeon: Shaaron Lamar HERO, MD;  [facility];  Service: Endoscopy;  Laterality: N/A;   IR RADIOLOGIST EVAL & MGMT  09/12/2019   IR RADIOLOGIST EVAL & MGMT  09/27/2019   UPPER GASTROINTESTINAL ENDOSCOPY     VASECTOMY     Social History   Socioeconomic History   Marital status: Married    Spouse name: Not on file   Number of children: 3   Years of education: Not on file   Highest education level: Not on file  Occupational History   Occupation: Production Manager    Comment: At [employer]   Tobacco Use   Smoking status: Never   Smokeless tobacco: Never  Vaping Use   Vaping status: Never Used  Substance and Sexual Activity   Alcohol use: No   Drug use: No   Sexual activity: Not on file  Other Topics Concern   Not on file  Social History Narrative   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   He is married and has 3 children, who are ages 88, 37, and 65 as of 2017.   The 47 and 39 year old children are still living at home, but the 62 yo is married and lives elsewhere.   The patient lives in [place], [place].      His primary care is [facility] in [place], [place].   Social Drivers of Corporate Investment Banker Strain: Not on file  Food Insecurity: Not on file  Transportation Needs: Not on file  Physical Activity: Not on file  Stress: Not on file  Social Connections: Not on file  Intimate Partner Violence: Not on file   Family History  Problem Relation Age of Onset   Stroke Mother    Cancer Maternal Grandfather        Unknown type of cancer   Heart attack Paternal Grandmother    Cancer Maternal Aunt        Possibly lung cancer   Cancer Paternal Uncle        Unknown type of cancer   Cancer Paternal Uncle        Unknown type of cancer   Colon cancer Neg Hx    Colon polyps Neg Hx    Esophageal cancer Neg Hx     Rectal cancer Neg Hx    Stomach cancer Neg Hx    I have reviewed his medical, social, and family history in detail and updated the electronic medical record as necessary.    PHYSICAL EXAMINATION  BP 122/86 (BP Location: Left Arm, Patient Position: Sitting)   Pulse (!) 104   Ht 5' 9 (1.753 m)   Wt 230 lb (104.3 kg)   BMI 33.97 kg/m  Wt Readings from Last 3 Encounters:  03/24/23 230 lb (104.3 kg)  11/13/19 187 lb (84.8 kg)  10/15/19 187 lb (84.8 kg)  GEN: NAD, appears stated age, doesn't appear chronically ill PSYCH: Cooperative, without pressured speech EYE: Conjunctivae pink, sclerae anicteric ENT: MMM CV: Nontachycardic RESP: No audible wheezing GI: NABS, soft, NT/ND, without rebound or guarding MSK/EXT: No lower extremity edema SKIN: No jaundice NEURO:  Alert & Oriented x 3, no focal deficits   REVIEW OF DATA  I reviewed the following data at  the time of this encounter:  GI Procedures and Studies  Previously reviewed  Laboratory Studies  No relevant studies to review  Imaging Studies  No new imaging studies to review   ASSESSMENT  Mr. Bennetts is a 63 y.o. male with a pmh significant for, prior cataracts ulcerative colitis (complicated by areas of stenosis inflammatory in nature of the colon), diverticulosis and diverticulitis (complicated by prior perforation treated medically and with IR drainage), GERD.  The patient is seen today for evaluation and management of:  1. Ulcerative pancolitis without complication (HCC)   2. History of diverticulitis    The patient is clinically and hemodynamically stable.  We were supposed to get updated colonoscopy last year, but this never occurred.  Overall patient has thankfully been doing well.  The rest 2024 for the patient was a very normal year for him.  I suspect based on how the patient is continuing to do, that he is both in an endoscopic and clinical remission.  He will continue current therapy for now.  Will plan to get  his colonoscopy up-to-date to ensure that he is stable and safe.  Will plan to do that in the coming months.  For now he will continue his current mesalamine  therapy.  The risks and benefits of endoscopic evaluation were discussed with the patient; these include but are not limited to the risk of perforation, infection, bleeding, missed lesions, lack of diagnosis, severe illness requiring hospitalization, as well as anesthesia and sedation related illnesses.  The patient and/or family is agreeable to proceed.  All patient questions were answered to the best of my ability, and the patient agrees to the aforementioned plan of action with follow-up as indicated.   PLAN  May continue PPI 20 mg once daily Continue Lialda  4.8 g daily Proceed with scheduling surveillance colonoscopy this year Should patient have another bout of diverticulitis or most especially complicated diverticulitis surgical intervention should be strongly considered, and this was rediscussed   No orders of the defined types were placed in this encounter.   New Prescriptions   No medications on file   Modified Medications   Modified Medication Previous Medication   MESALAMINE  (LIALDA ) 1.2 G EC TABLET mesalamine  (LIALDA ) 1.2 g EC tablet      TAKE 4 TABLETS BY MOUTH ONCE DAILY AT 2 PM IN THE EVENING    TAKE 4 TABLETS BY MOUTH ONCE DAILY AT 2 PM IN THE EVENING    Planned Follow Up No follow-ups on file.   Total Time in Face-to-Face and in Coordination of Care for patient including independent/personal interpretation/review of prior testing, medical history, examination, medication adjustment, communicating results with the patient directly, and documentation within the EHR is 25 minutes.   Aloha Finner, MD [facility] Advanced [facility]oscopy Office # [phone]

## 2023-03-24 NOTE — Patient Instructions (Signed)
 It has been recommended to you by your physician that you have a(n) Colonoscopy completed. Per your request, we did not schedule the procedure(s) today. Please contact our office at [phone] in March to schedule for May or June 2025.   We have sent the following medications to your pharmacy for you to pick up at your convenience: Mesalamine    _______________________________________________________  If your blood pressure at your visit was 140/90 or greater, please contact your primary care physician to follow up on this.  _______________________________________________________  If you are age 62 or older, your body mass index should be between 23-30. Your Body mass index is 33.97 kg/m. If this is out of the aforementioned range listed, please consider follow up with your Primary Care Provider.  If you are age 64 or younger, your body mass index should be between 19-25. Your Body mass index is 33.97 kg/m. If this is out of the aformentioned range listed, please consider follow up with your Primary Care Provider.   ________________________________________________________  The [facility] providers would like to encourage you to use MYCHART to communicate with providers for non-urgent requests or questions.  Due to long hold times on the telephone, sending your provider a message by [facility] may be a faster and more efficient way to get a response.  Please allow 48 business hours for a response.  Please remember that this is for non-urgent requests.  _______________________________________________________  Thank you for choosing me and [facility].  Dr. Wilhelmenia

## 2023-03-25 ENCOUNTER — Encounter: Payer: Self-pay | Admitting: Gastroenterology

## 2023-03-25 DIAGNOSIS — Z8719 Personal history of other diseases of the digestive system: Secondary | ICD-10-CM | POA: Insufficient documentation

## 2023-06-13 ENCOUNTER — Other Ambulatory Visit: Payer: Self-pay | Admitting: Gastroenterology

## 2023-06-13 DIAGNOSIS — K5100 Ulcerative (chronic) pancolitis without complications: Secondary | ICD-10-CM

## 2023-06-13 NOTE — Telephone Encounter (Signed)
 Patient needs to contact office to schedule colonoscopy.

## 2023-07-01 ENCOUNTER — Ambulatory Visit: Admitting: Gastroenterology

## 2023-07-01 ENCOUNTER — Encounter: Payer: Self-pay | Admitting: Gastroenterology

## 2023-07-01 VITALS — BP 120/80 | HR 95 | Ht 69.0 in | Wt 221.6 lb

## 2023-07-01 DIAGNOSIS — K529 Noninfective gastroenteritis and colitis, unspecified: Secondary | ICD-10-CM

## 2023-07-01 DIAGNOSIS — K429 Umbilical hernia without obstruction or gangrene: Secondary | ICD-10-CM | POA: Diagnosis not present

## 2023-07-01 DIAGNOSIS — R109 Unspecified abdominal pain: Secondary | ICD-10-CM | POA: Diagnosis not present

## 2023-07-01 DIAGNOSIS — K5100 Ulcerative (chronic) pancolitis without complications: Secondary | ICD-10-CM

## 2023-07-01 NOTE — Progress Notes (Signed)
 GASTROENTEROLOGY OUTPATIENT CLINIC VISIT   Primary Care Provider Patient, No Pcp Per No address on file None   Patient Profile: Brett Bright is a 63 y.o. male with a pmh significant for prior cataracts, s/p cholecystectomy, ulcerative colitis (complicated by areas of stenosis inflammatory in nature of the colon), diverticulosis and diverticulitis (complicated by prior perforation treated medically and with IR drainage), GERD.  The patient presents to the [facility] for an evaluation and management of problem(s) noted below:  Problem List 1. Umbilical hernia without obstruction and without gangrene   2. Abdominal discomfort   3. 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  History of Present Illness Please see prior notes for full details of HPI.  Interval History The patient returns for follow-up and evaluation of a newer abdominal discomfort and finding on his abdomen concerning for an abdominal hernia.  He has noticed the recent appearance of a hernia around his umbilical region.  The hernia was first noted before a trip to [facility] in February, during which he experienced some pain/discomfort, particularly a twisting sensation, possibly related to handling luggage. The pain is not constant and is manageable. The hernia can be released back in but tends to protrude again during the day.  He is concerned about the potential for the hernia to worsen or strangulate.  His weight has been stable. He has not had any further diverticulitis episodes.  He is ready for scheduling his colonoscopy for surveillance purposes.  Only recent episode of diarrhea was around the time of a "GI Bug" at home between his family and daughter.  No blood in his stools has been noted.   IBD diary BMs per day -2-3 formed BMs per day  Nocturnal BMs  -none Blood -none Mucous -none Tenesmus -none Urgency -none Skin Manifestations -none Eye Manifestations -none Joint Manifestations -none  GI Review of Systems Positive as above Negative for pyrosis, dysphagia, odynophagia, nausea, vomiting, alteration of bowel habits, melena, hematochezia  Review of Systems General: Denies fevers/chills/weight loss unintentionally HEENT: Denies oral lesions Cardiovascular: Denies chest pain Pulmonary: Denies shortness of breath Gastroenterological: See HPI Genitourinary: Denies darkened urine Hematological: Denies easy bruising/bleeding Dermatological: Denies jaundice Psychological: Mood is stable   Medications Current Outpatient Medications  Medication Sig Dispense Refill   acetaminophen (TYLENOL) 325 MG tablet Take 325 mg by mouth every 6 (six) hours as needed for fever.     mesalamine (LIALDA) 1.2 g EC tablet TAKE 4 TABLETS BY MOUTH ONCE DAILY AT  2PM  IN  THE  EVENING 120 tablet 0   omeprazole (PRILOSEC) 20 MG capsule Take 20 mg by mouth daily.     No current facility-administered medications for this visit.    Allergies No Known Allergies  Histories Past Medical History:  Diagnosis Date   Anemia    On Iron -- 2019   Blood transfusion without reported diagnosis 2019   Clotting disorder (HCC) 2018   Ulcerative colitis with rectal bleeding (HCC) 07/2018   Past Surgical History:  Procedure Laterality Date   APPENDECTOMY     BIOPSY  08/16/2018   Procedure: BIOPSY;  Surgeon: Suzette Espy, MD;  [facility];  Service: Endoscopy;;   CATARACT EXTRACTION     CHOLECYSTECTOMY     COLONOSCOPY N/A 03/23/2016   Dr. Nolene Baumgarten: Thrombosed external hemorrhoid, internal hemorrhoids, diverticulosis, segmental moderate inflammation sigmoid  colon secondary due to colitis with benign biopsies.  Next colonoscopy 5 years due to poor prep right colon.   COLONOSCOPY N/A 08/16/2018   Dr. Riley Cheadle: pan-ulcerative colitis, diverticulosis. Bx  c/w UC   COLONOSCOPY     ESOPHAGOGASTRODUODENOSCOPY N/A 03/23/2016   Dr. Nolene Baumgarten: Gastritis, no H. pylori, multiple small sessile polyps biopsy consistent with fundic gland, mild duodenitis.   ESOPHAGOGASTRODUODENOSCOPY N/A 08/16/2018   Procedure: ESOPHAGOGASTRODUODENOSCOPY (EGD);  Surgeon: Suzette Espy, MD;  Location[facility];  Service: Endoscopy;  Laterality: N/A;   IR RADIOLOGIST EVAL & MGMT  09/12/2019   IR RADIOLOGIST EVAL & MGMT  09/27/2019   UPPER GASTROINTESTINAL ENDOSCOPY     VASECTOMY     Social History   Socioeconomic History   Marital status: Married    Spouse name: Not on file   Number of children: 3   Years of education: Not on file   Highest education level: Not on file  Occupational History   Occupation: Production manager    Comment: At [employer]   Tobacco Use   Smoking status: Never   Smokeless tobacco: Never  Vaping Use   Vaping status: Never Used  Substance and Sexual Activity   Alcohol use: No   Drug use: No   Sexual activity: Not on file  Other Topics Concern   Not on file  Social History Narrative   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   He is married and has 3 children, who are ages 95, 21, and 2 as of 2017.   The 17 and 28 year old children are still living at home, but the 62 yo is married and lives elsewhere.   The patient lives in [place], [place].      His primary care is [facility] in [place], [place].   Social Drivers of Corporate investment banker Strain: Not on file  Food Insecurity: Not on file  Transportation Needs: Not on file  Physical Activity: Not on file  Stress: Not on file  Social Connections: Not on file  Intimate Partner Violence: Not on file   Family History  Problem Relation Age of Onset   Stroke Mother    Cancer Maternal Grandfather        Unknown type of cancer   Heart attack Paternal Grandmother    Cancer Maternal Aunt        Possibly lung cancer   Cancer  Paternal Uncle        Unknown type of cancer   Cancer Paternal Uncle        Unknown type of cancer   Colon cancer Neg Hx    Colon polyps Neg Hx    Esophageal cancer Neg Hx    Rectal cancer Neg Hx    Stomach cancer Neg Hx    I have reviewed his medical, social, and family history in detail and updated the electronic medical record as necessary.    PHYSICAL EXAMINATION  BP 120/80   Pulse 95   Ht 5\' 9"  (1.753 m)   Wt 221 lb 9.6 oz (100.5 kg)   BMI 32.72 kg/m  Wt Readings from Last 3 Encounters:  07/01/23 221 lb 9.6 oz (100.5 kg)  03/24/23 230 lb (104.3 kg)  11/13/19 187 lb (84.8 kg)  GEN: NAD, appears stated age, doesn't appear chronically ill PSYCH: Cooperative, without pressured speech EYE: Conjunctivae pink, sclerae anicteric ENT: MMM CV: Nontachycardic RESP: No audible wheezing GI:  NABS, soft, supraumbilical hernia noted (reducible), mild ventral diastases, without rebound or guarding MSK/EXT: No lower extremity edema SKIN: No jaundice NEURO:  Alert & Oriented x 3, no focal deficits   REVIEW OF DATA  I reviewed the following data at the time of this encounter:  GI Procedures and Studies  Previously reviewed  Laboratory Studies  No relevant studies to review  Imaging Studies  No new imaging studies to review   ASSESSMENT  Mr. Talerico is a 63 y.o. male with a pmh significant for prior cataracts, s/p cholecystectomy, ulcerative colitis (complicated by areas of stenosis inflammatory in nature of the colon), diverticulosis and diverticulitis (complicated by prior perforation treated medically and with IR drainage), GERD.  The patient is seen today for evaluation and management of:  1. Umbilical hernia without obstruction and without gangrene   2. Abdominal discomfort   3. Ulcerative pancolitis without complication [facility])    The patient is hemodynamically stable.  Clinically today he is doing well.  He does have new evidence of what appears to be a slightly  supraumbilical hernia which is able to be reduced.  However, patient is having some abdominal discomfort associated with this.  I think surgical evaluation for consideration of mesh may be worthwhile.  I am not completely convinced that there may be a second herniation closer to the sternal region.  We will hold on cross-sectional imaging but refer to surgery.  He is due for colonoscopy surveillance this year and we will work on getting him scheduled for that.  The risks and benefits of endoscopic evaluation were discussed with the patient; these include but are not limited to the risk of perforation, infection, bleeding, missed lesions, lack of diagnosis, severe illness requiring hospitalization, as well as anesthesia and sedation related illnesses.  The patient and/or family is agreeable to proceed.  All patient questions were answered to the best of my ability, and the patient agrees to the aforementioned plan of action with follow-up as indicated.   PLAN  May continue PPI 20 mg once daily Continue Lialda 4.8 g daily Proceed with scheduling surveillance colonoscopy this year Should patient have another bout of diverticulitis or most especially complicated diverticulitis surgical intervention should be strongly considered, and this was rediscussed Referral to [facility] surgery for supraumbilical hernia repair consideration-Dr. Hershell Lose had met patient previously years ago during bout of complicated diverticulitis   Orders Placed This Encounter  Procedures   Ambulatory referral to General Surgery    New Prescriptions   No medications on file   Modified Medications   No medications on file    Planned Follow Up No follow-ups on file.   Total Time in Face-to-Face and in Coordination of Care for patient including independent/personal interpretation/review of prior testing, medical history, examination, medication adjustment, communicating results with the patient directly, and  documentation within the EHR is 25 minutes.   Yong Henle, MD [facility] Advanced [facility]oscopy Office # [phone]

## 2023-07-01 NOTE — Patient Instructions (Signed)
 You will be referred to [facility] after Dr.Mansouraty has reviewed records further.  They will contact you directly to schedule an appointment.  It may take a week or more before you hear from them.  Please feel free to contact us if you have not heard from them within 2 weeks and we will follow up on the referral.   You will be due for a recall colonoscopy in 10/2023. We will send you a reminder in the mail when it gets closer to that time.  _______________________________________________________  If your blood pressure at your visit was 140/90 or greater, please contact your primary care physician to follow up on this.  _______________________________________________________  If you are age 85 or older, your body mass index should be between 23-30. Your Body mass index is 32.72 kg/m. If this is out of the aforementioned range listed, please consider follow up with your Primary Care Provider.  If you are age 80 or younger, your body mass index should be between 19-25. Your Body mass index is 32.72 kg/m. If this is out of the aformentioned range listed, please consider follow up with your Primary Care Provider.   ________________________________________________________  The [facility] providers would like to encourage you to use [facility] to communicate with providers for non-urgent requests or questions.  Due to long hold times on the telephone, sending your provider a message by [facility] may be a faster and more efficient way to get a response.  Please allow 48 business hours for a response.  Please remember that this is for non-urgent requests.  _______________________________________________________  Thank you for choosing me and [facility].  Dr. Meridee Score

## 2023-07-05 ENCOUNTER — Telehealth: Payer: Self-pay

## 2023-07-05 ENCOUNTER — Encounter: Payer: Self-pay | Admitting: Gastroenterology

## 2023-07-05 DIAGNOSIS — K429 Umbilical hernia without obstruction or gangrene: Secondary | ICD-10-CM | POA: Insufficient documentation

## 2023-07-05 DIAGNOSIS — R109 Unspecified abdominal pain: Secondary | ICD-10-CM | POA: Insufficient documentation

## 2023-07-05 NOTE — Telephone Encounter (Signed)
 Referral has been faxed to [facility] - Dr Hershell Lose.

## 2023-07-05 NOTE — Telephone Encounter (Signed)
-----   Message from [facility] sent at 07/05/2023  5:23 AM EDT ----- Regarding: Referral Brett Bright, Placed referral to [facility] surgery, Dr. Hershell Lose who had seen the patient previously for consideration of hernia repair. Thanks. GM

## 2023-07-10 ENCOUNTER — Other Ambulatory Visit: Payer: Self-pay | Admitting: Gastroenterology

## 2023-07-10 DIAGNOSIS — K5100 Ulcerative (chronic) pancolitis without complications: Secondary | ICD-10-CM

## 2023-08-05 ENCOUNTER — Other Ambulatory Visit: Payer: Self-pay | Admitting: Gastroenterology

## 2023-08-05 DIAGNOSIS — K5100 Ulcerative (chronic) pancolitis without complications: Secondary | ICD-10-CM

## 2023-08-11 ENCOUNTER — Telehealth: Payer: Self-pay | Admitting: Gastroenterology

## 2023-08-11 NOTE — Telephone Encounter (Signed)
 Inbound call from patient stating that in the last 24 hours he has been vomiting and has not been able to keep anything down. Patient is requesting a call back to discuss further. Please advise, thank you.

## 2023-08-11 NOTE — Telephone Encounter (Signed)
 Spoke with patient. Reports since yesterday afternoon, developed N/V. Reports very acidic. States there is some food in it and it is brown. No abd pain, "except where the hernia is", no fevers.Reports vomited at least 8 times since yesterday. Pt has had no other symptoms (no fever or chills) he is just unable to keep food down. Patient reports that he is having hernia surgery at [facility] on 09/09/23. Patient is concerned that this could be related to his hernia. Would like input from provider.

## 2023-08-12 NOTE — Telephone Encounter (Signed)
 Called and spoke with patient for update. Reports he is feeling "much better" this morning. No more vomiting. Patient has been able to keep food down. Denies the need for the Zofran  Rx.

## 2023-08-12 NOTE — Telephone Encounter (Signed)
 I am sorry to hear this. Hopefully this is some sort of gastroenteritis that will pass. But not clear, especially if he is worsening. Have Zofran  8 mg every 8 hours as needed (20/0), for nausea symptoms. Bland diet recommended if he is tolerating anything. Recommend patient have CBC/CMP/amylase/lipase performed (stat labs). Stat KUB 2 view/chest x-ray performed. Urgent CT abdomen pelvis with IV and oral contrast (if can tolerate). If symptoms progress or worsen, will need to go to the emergency department. Thanks. GM

## 2023-08-12 NOTE — Telephone Encounter (Signed)
 Attempted to reach patient to relay Dr. Marolyn Sis message. No answer, left VM for patient to return call if needed.

## 2023-08-12 NOTE — Telephone Encounter (Signed)
 Glad he is doing so much better. Okay to hold off on evaluation. Thanks. GM

## 2023-10-27 ENCOUNTER — Other Ambulatory Visit: Payer: Self-pay | Admitting: Gastroenterology

## 2023-10-27 DIAGNOSIS — K5100 Ulcerative (chronic) pancolitis without complications: Secondary | ICD-10-CM

## 2023-11-25 ENCOUNTER — Other Ambulatory Visit: Payer: Self-pay | Admitting: Gastroenterology

## 2023-11-25 DIAGNOSIS — K5100 Ulcerative (chronic) pancolitis without complications: Secondary | ICD-10-CM

## 2023-12-15 ENCOUNTER — Encounter: Payer: Self-pay | Admitting: Gastroenterology

## 2023-12-21 ENCOUNTER — Other Ambulatory Visit: Payer: Self-pay | Admitting: Gastroenterology

## 2023-12-21 DIAGNOSIS — K5100 Ulcerative (chronic) pancolitis without complications: Secondary | ICD-10-CM

## 2024-01-16 ENCOUNTER — Other Ambulatory Visit: Payer: Self-pay | Admitting: Gastroenterology

## 2024-01-16 DIAGNOSIS — K5100 Ulcerative (chronic) pancolitis without complications: Secondary | ICD-10-CM

## 2024-04-11 ENCOUNTER — Other Ambulatory Visit: Payer: Self-pay | Admitting: Gastroenterology

## 2024-04-11 DIAGNOSIS — K5100 Ulcerative (chronic) pancolitis without complications: Secondary | ICD-10-CM
